# Patient Record
Sex: Male | Born: 1965 | Race: Black or African American | Marital: Married | Smoking: Never smoker
Health system: Southern US, Community
[De-identification: ages and names within clinical notes are randomized; demographics above are authoritative.]

## PROBLEM LIST (undated history)

## (undated) DIAGNOSIS — K219 Gastro-esophageal reflux disease without esophagitis: Secondary | ICD-10-CM

## (undated) DIAGNOSIS — A498 Other bacterial infections of unspecified site: Secondary | ICD-10-CM

## (undated) DIAGNOSIS — C20 Malignant neoplasm of rectum: Secondary | ICD-10-CM

## (undated) DIAGNOSIS — I10 Essential (primary) hypertension: Secondary | ICD-10-CM

## (undated) DIAGNOSIS — T7840XA Allergy, unspecified, initial encounter: Secondary | ICD-10-CM

## (undated) DIAGNOSIS — Z8042 Family history of malignant neoplasm of prostate: Secondary | ICD-10-CM

## (undated) DIAGNOSIS — Z8661 Personal history of infections of the central nervous system: Secondary | ICD-10-CM

## (undated) HISTORY — DX: Family history of malignant neoplasm of prostate: Z80.42

## (undated) HISTORY — DX: Allergy, unspecified, initial encounter: T78.40XA

## (undated) HISTORY — DX: Personal history of infections of the central nervous system: Z86.61

## (undated) NOTE — *Deleted (*Deleted)
[facility]   Telephone:(336) [phone] [phone]   Clinic Follow up Note   Patient Care Team: Kristian Covey, MD as PCP - General (Family Medicine) Willis Modena, MD as Consulting Physician (Gastroenterology) Karie Soda, MD as Consulting Physician (General Surgery) Dorothy Puffer, MD as Consulting Physician (Radiation Oncology) Malachy Mood, MD as Consulting Physician (Oncology)  Date of Service:  08/18/2020   CHIEF COMPLAINT: F/u of rectal cancer  SUMMARY OF ONCOLOGIC HISTORY: Oncology History Overview Note  Cancer Staging Rectal adenocarcinoma [facility]) Staging form: Colon and Rectum, AJCC 8th Edition - Clinical stage from 07/06/2017: Stage IIIB (cT3, cN1, cM0) - Signed by Pollyann Samples, NP on 07/10/2017    Rectal adenocarcinoma s/p robotic LAR resection 10/10/2017  07/03/2017 Imaging   CT ABD PELVIS W CONTRAST IMPRESSION: Possible mass in the rectum compatible carcinoma. Endoscopy recommended for further evaluation. Otherwise negative.   07/03/2017 Tumor Marker   CEA 2.7   07/05/2017 Initial Biopsy   Rectum biopsy -invasive adenocarcinoma, poorly differentiated   07/05/2017 Procedure   Colonoscopy Per Dr. Dulce Sellar Findings: The perianal and digital rectal examinations were normal.  Internal hemorrhoids were found during retroflexion. The hemorrhoids were moderate. No additional abnormalities were found on retroflexion.  A fungating, sessile and ulcerated partially obstructing large mass was found in the recto-sigmoid colon.The mass was partially circumferential (involving two-thirds of the lumen circumference). Oozing was present. Area was successfully injected with 2 mL [place] ink for tattooing. This was biopsied with a cold forceps for histology.   07/05/2017 Initial Diagnosis   Rectal adenocarcinoma (HCC)   07/06/2017 Imaging   Staging MRI ABD/PELVIS revealed clinical stage T3N1M0, IIIb    07/18/2017 - 08/24/2017 Radiation Therapy   Radaition  with Dr Mitzi Hansen     Radiation treatment dates:   07/18/2017 - 08/24/2017  Site/dose:   The rectum was treated to 45 Gy in 25 fractions of 1.8 Gy, followed by a 5.4 Gy boost in 3 fractions to yield a total dose of 50.4 Gy.  Narrative: The patient tolerated radiation treatment relatively well.   He had rectal bleeding with clots that was bright red after bowel movements. He reported loose stools that were painful. The skin to the radiation site was irritated, but no skin breakage was noted.    07/18/2017 Imaging   CT CHEST  IMPRESSION: Negative. No evidence of thoracic metastatic disease or other significant abnormality.    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  The Hereditary Gene Panel offered by [facility] includes sequencing and/or deletion duplication testing of the following 47 genes: APC, ATM, AXIN2, BARD1, BMPR1A, BRCA1, BRCA2, BRIP1, CDH1, CDK4, CDKN2A (p14ARF), CDKN2A (p16INK4a), CHEK2, CTNNA1, DICER1, EPCAM (Deletion/duplication testing only), GREM1 (promoter region deletion/duplication testing only), KIT, MEN1, MLH1, MSH2, MSH3, MSH6, MUTYH, NBN, NF1, NHTL1, PALB2, PDGFRA, PMS2, POLD1, POLE, PTEN, RAD50, RAD51C, RAD51D, SDHB, SDHC, SDHD, SMAD4, SMARCA4. STK11, TP53, TSC1, TSC2, and VHL.  The following genes were evaluated for sequence changes only: SDHA and HOXB13 c.251G>A variant only.  The report date is August 27, 2017.    10/10/2017 Surgery   XI ROBOTIC ASSISTED LOWER ANTERIOR RESECTION AND RIGID PROCTOSCOPY bu Dr. Michaell Cowing and Dr. Cliffton Asters  10/10/17    10/10/2017 Pathology Results       Diagnosis 10/10/17  1. Colon, segmental resection for tumor, sigmoid SMALL FOCUS  OF COLONIC GLANDS WITH HIGH GRADE DYSPLASIA POST NEOADJUVANT CHEMORADIATION THERAPY BIOPSY SITE WITH CHRONIC INFLAMMATION  NO RESIDULE INVASIVE CARCINOMA PRESENT TWENTY-TWO BENIGN LYMPH NODES (0/22) 2. Colon, resection margin (donut), final distal BENIGN COLONIC TISSUE   11/19/2017 - 03/04/2018 Adjuvant Chemotherapy   He started adjuvant 2000 mg Xeloda BID 2 weeks on 1 week off on 11/19/17 and completed 5 cycles on 03/04/18   04/08/2018 Imaging   CT AP W Contrast 04/08/18 IMPRESSION: 1. Postoperative and post treatment related changes in the low anatomic pelvis, as above. Today's study will serve as a new baseline for future follow-up examinations. At this time, there is no definitive evidence to strongly suggest local recurrence of disease or definite metastatic disease in the abdomen or pelvis. 2. Mild pancreatic ductal dilatation. This is of uncertain etiology and significance. No obstructing lesion identified in the pancreatic head. Correlation with nonemergent MRI of the abdomen with and without IV gadolinium with MRCP is suggested in the near future to evaluate for the etiology of this dilatation.    10/21/2018 Imaging   CT CAP W Contrast 10/21/18 IMPRESSION: Status post low anterior resection with suspected radiation changes in the presacral space. No evidence of recurrent or metastatic disease.   10/03/2019 Imaging   CT CAP W Contast  IMPRESSION: 1. No findings of recurrent malignancy. 2. Abnormal prominence of the dorsal pancreatic duct in the pancreatic body and head, measuring up to 6 mm in diameter, similar to prior, cause uncertain. 3. Wall thickening in the lower rectum below the level of the anastomotic staple line, probably therapy related. 4. Stable presacral soft tissue density, likely therapy related. 5. Mild degenerative disc disease at L4-5 and L5-S1.          CURRENT THERAPY:  Surveillance  INTERVAL HISTORY: *** Paul [name] is here for a follow up of rectal cancer. He was last seen by me 6 months ago. He presents to the clinic alone.    REVIEW OF SYSTEMS:  ***  Constitutional: Denies fevers, chills or abnormal weight loss Eyes: Denies blurriness of vision Ears, nose, mouth, throat, and face: Denies mucositis or sore throat Respiratory: Denies cough, dyspnea or wheezes Cardiovascular: Denies palpitation, chest discomfort or lower extremity swelling Gastrointestinal:  Denies nausea, heartburn or change in bowel habits Skin: Denies abnormal skin rashes Lymphatics: Denies new lymphadenopathy or easy bruising Neurological:Denies numbness, tingling or new weaknesses Behavioral/Psych: Mood is stable, no new changes  All other systems were reviewed with the patient and are negative.  MEDICAL HISTORY:  Past Medical History:  Diagnosis Date  . Allergy    grass, dander  . Essential hypertension 07/03/2017  . Family history of prostate cancer   . GERD (gastroesophageal reflux disease)   . History of meningitis   . Hypertension   . 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 COLONOSCOPY WITH PROPOFOL Left 07/05/2017   Procedure: COLONOSCOPY WITH PROPOFOL;  Surgeon: Willis Modena, MD;  Location: [facility] ENDOSCOPY;  Service: Endoscopy;  Laterality: Left;  . PROCTOSCOPY N/A 10/10/2017   Procedure: RIGID PROCTOSCOPY;  Surgeon: Karie Soda, MD;  Location: [facility] ORS;  Service: General;  Laterality: N/A;  . XI ROBOTIC ASSISTED LOWER ANTERIOR RESECTION N/A 10/10/2017   Procedure: XI ROBOTIC ASSISTED LOWER ANTERIOR RESECTION;  Surgeon: Karie Soda, MD;  Location: [facility] ORS;  Service: General;  Laterality: N/A;    I have reviewed the  social history and family history with the patient and they are unchanged from previous note.  ALLERGIES:  is allergic to chloroquine.  MEDICATIONS:  Current Outpatient Medications  Medication Sig Dispense Refill  . amLODipine (NORVASC) 5 MG tablet TAKE 1 TABLET DAILY 90  tablet 3  . calcium carbonate (OS-CAL - DOSED IN MG OF ELEMENTAL CALCIUM) 1250 (500 Ca) MG tablet Take 1 tablet by mouth daily as needed (for bones).    . Calcium Citrate-Vitamin D (CALCIUM CITRATE+D3 PETITES PO) Take 1 tablet by mouth daily.    . cetirizine (ZYRTEC) 10 MG tablet Take 10 mg by mouth daily as needed for allergies.    Marland Kitchen GARLIC PO Take 1 capsule by mouth daily.     . Multiple Vitamin (MULTIVITAMIN WITH MINERALS) TABS tablet Take 1 tablet by mouth daily.    . naproxen (NAPROSYN) 500 MG tablet Take 1 tablet (500 mg total) by mouth every 12 (twelve) hours as needed for mild pain or moderate pain. 30 tablet 1  . omeprazole (PRILOSEC) 40 MG capsule Take 40 mg by mouth daily as needed (heart burn).     . sildenafil (VIAGRA) 100 MG tablet Take 1 tablet (100 mg total) by mouth daily as needed for erectile dysfunction. 10 tablet 11   No current facility-administered medications for this visit.    PHYSICAL EXAMINATION: ECOG PERFORMANCE STATUS: {CHL ONC ECOG PS:[id number]}  There were no vitals filed for this visit. There were no vitals filed for this visit. *** GENERAL:alert, no distress and comfortable SKIN: skin color, texture, turgor are normal, no rashes or significant lesions EYES: normal, Conjunctiva are pink and non-injected, sclera clear {OROPHARYNX:no exudate, no erythema and lips, buccal mucosa, and tongue normal}  NECK: supple, thyroid normal size, non-tender, without nodularity LYMPH:  no palpable lymphadenopathy in the cervical, axillary {or inguinal} LUNGS: clear to auscultation and percussion with normal breathing effort HEART: regular rate & rhythm and no murmurs and no lower extremity edema ABDOMEN:abdomen soft, non-tender and normal bowel sounds Musculoskeletal:no cyanosis of digits and no clubbing  NEURO: alert & oriented x 3 with fluent speech, no focal motor/sensory deficits  LABORATORY DATA:  I have reviewed the data as listed CBC Latest Ref Rng & Units  08/18/2020 02/19/2020 10/22/2019  WBC 4.0 - 10.5 K/uL 4.4 2.7(L) 2.7(L)  Hemoglobin 13.0 - 17.0 g/dL 16.1 09.6 04.5  Hematocrit 39 - 52 % 41.0 38.0(L) 42.4  Platelets 150 - 400 K/uL 196 160 180     CMP Latest Ref Rng & Units 08/18/2020 02/19/2020 10/22/2019  Glucose 70 - 99 mg/dL 97 409(W) 119(J)  BUN 6 - 20 mg/dL 9 10 9   Creatinine 0.61 - 1.24 mg/dL 4.78 2.95 6.21  Sodium 135 - 145 mmol/L 141 140 141  Potassium 3.5 - 5.1 mmol/L 4.1 3.7 4.0  Chloride 98 - 111 mmol/L 103 106 104  CO2 22 - 32 mmol/L 30 27 28   Calcium 8.9 - 10.3 mg/dL 9.7 3.0(Q) 9.0  Total Protein 6.5 - 8.1 g/dL 7.3 7.0 7.8  Total Bilirubin 0.3 - 1.2 mg/dL 0.6 0.5 0.5  Alkaline Phos 38 - 126 U/L 76 76 68  AST 15 - 41 U/L 25 24 26   ALT 0 - 44 U/L 26 24 25       RADIOGRAPHIC STUDIES: I have personally reviewed the radiological images as listed and agreed with the findings in the report. No results found.   ASSESSMENT & PLAN:  Alfreddie Consalvo is a 46 y.o. male with    1. Poorly  differentiated invasive rectal adenocarcinoma, cT3N1M0, stage IIIb, ypT0N0 -He was diagnosed in 06/2017. He is s/p concurrent chemoradiation with Xeloda, Surgical resection and 5 cycles of adjuvant Xeloda.  -He has recovered well fromsurgery and chemotreatment  -He completed repeat colonoscopy in 10/11/2018 through [facility]. Will repeat in 3 years. -He is clinically doing moderately well with mild constipation without completely emptying bowel. He will have occasional stool leakage. He had pelvic PT before with some improvement. Physical exam unremarkable. Labs reviewed, CBC and CMP WNL except stable WBC 2.7, ANC 1.5. CEA is still pending. He denies any recent infections.  -He is 2.5 years since his cancer diagnosis and his risk of recurrence is decreasing. Continue 5 year surveillance. Next and last surveillance scan in 6 months.  -F/u in 6 months.  -He has received his first COVID19 vaccine last week.    2. Anemia; iron deficiency   -Anemia and GI bleeding resolved.   3. GeneticsNegative for pathogenetic mutations  4.Constipation/loose stool, gas,sexual dysfunction,secondary to his surgeryand radiation. -His Bm lately are more constipated and does not feel his bowel are completely emptied. He is still able to have BM daily with no pain or bleeding. He only has mild leakage.  -He has tried Miralax up to 3 times a day and Metamucil without much help. I recommend Seneka-S or liquid magnesium citrate if he cannot have BM for a few days. I encouraged he eat enough fiber and drink enough water.   5. HTN  -he is currently taking 5 mg amlodipinedaily. -He does not have BP cuff at home and goes to [facility] to check it.  -His BP at 154/91 today (02/19/20). He notes he has not taken his medication yet. I suggest he f/u with PCP to better control HTN.   PLAN: -F/u in 6 months with lab and CT CAP a few days before    No problem-specific Assessment & Plan notes found for this encounter.   No orders of the defined types were placed in this encounter.  All questions were answered. The patient knows to call the clinic with any problems, questions or concerns. No barriers to learning was detected. The total time spent in the appointment was {CHL ONC TIME VISIT - [id number]}.     Delphina Cahill 08/18/2020   Rogelia Rohrer, am acting as scribe for Malachy Mood, MD.   {Add scribe attestation statement}

---

## 2004-04-05 ENCOUNTER — Ambulatory Visit (HOSPITAL_COMMUNITY): Admission: RE | Admit: 2004-04-05 | Discharge: 2004-04-05 | Payer: Self-pay | Admitting: Gastroenterology

## 2005-07-31 ENCOUNTER — Ambulatory Visit: Payer: Self-pay | Admitting: Gastroenterology

## 2005-08-16 ENCOUNTER — Ambulatory Visit: Payer: Self-pay | Admitting: Gastroenterology

## 2006-01-13 ENCOUNTER — Encounter: Payer: Self-pay | Admitting: Emergency Medicine

## 2006-01-14 ENCOUNTER — Inpatient Hospital Stay (HOSPITAL_COMMUNITY): Admission: EM | Admit: 2006-01-14 | Discharge: 2006-01-21 | Payer: Self-pay | Admitting: Emergency Medicine

## 2006-02-12 ENCOUNTER — Encounter (INDEPENDENT_AMBULATORY_CARE_PROVIDER_SITE_OTHER): Payer: Self-pay | Admitting: *Deleted

## 2006-02-12 ENCOUNTER — Ambulatory Visit (HOSPITAL_BASED_OUTPATIENT_CLINIC_OR_DEPARTMENT_OTHER): Admission: RE | Admit: 2006-02-12 | Discharge: 2006-02-12 | Payer: Self-pay | Admitting: Urology

## 2007-07-30 ENCOUNTER — Emergency Department (HOSPITAL_COMMUNITY): Admission: EM | Admit: 2007-07-30 | Discharge: 2007-07-30 | Payer: Self-pay | Admitting: Emergency Medicine

## 2008-02-15 ENCOUNTER — Ambulatory Visit: Payer: Self-pay | Admitting: Internal Medicine

## 2008-02-15 ENCOUNTER — Inpatient Hospital Stay (HOSPITAL_COMMUNITY): Admission: EM | Admit: 2008-02-15 | Discharge: 2008-02-15 | Payer: Self-pay | Admitting: Emergency Medicine

## 2009-11-10 ENCOUNTER — Encounter: Admission: RE | Admit: 2009-11-10 | Discharge: 2009-11-10 | Payer: Self-pay | Admitting: Emergency Medicine

## 2011-02-23 ENCOUNTER — Other Ambulatory Visit: Payer: Self-pay | Admitting: Family Medicine

## 2011-02-23 DIAGNOSIS — M549 Dorsalgia, unspecified: Secondary | ICD-10-CM

## 2011-02-24 ENCOUNTER — Ambulatory Visit
Admission: RE | Admit: 2011-02-24 | Discharge: 2011-02-24 | Disposition: A | Discharge status: Home / Self Care | Payer: 59 | Source: Ambulatory Visit | Attending: Family Medicine | Admitting: Family Medicine

## 2011-02-24 DIAGNOSIS — M549 Dorsalgia, unspecified: Secondary | ICD-10-CM

## 2011-03-07 NOTE — H&P (Signed)
Paul [name], [name]:  [id number]   MEDICAL RECORD NO.:  [id number]          PATIENT TYPE:  EMS   LOCATION:  [facility]                         FACILITY:  [facility]   PHYSICIAN:  Gardiner Barefoot, MD    DATE OF BIRTH:  18-Dec-1965   DATE OF ADMISSION:  02/15/2008  DATE OF DISCHARGE:                              HISTORY & PHYSICAL   PRIMARY CARE PHYSICIAN:  [name]. [name], M.D.   CHIEF COMPLAINT:  Left shoulder pain.   HISTORY OF PRESENT ILLNESS:  This is a 45 year old male, previously  healthy, presented here with left shoulder pain for about 1 day that  radiates to his chest.  The patient describes difficulty lifting his  arm, painful with movement, and pressure in his left-sided chest.  The  patient reports that he has never had this before.  No associated  nausea, vomiting, or diaphoresis.  The patient is a nonsmoker and has a  family history of early cardiac disease.  The patient does not know of  any inciting event.   PAST MEDICAL HISTORY:  None.   MEDICATIONS:  None.   ALLERGIES:  No known drug allergies.   SOCIAL HISTORY:  The patient works in second shift.  He denies alcohol,  tobacco, or drugs.   FAMILY HISTORY:  No history of early cardiac death or early cardiac MI.   REVIEW OF SYSTEMS:  Review of systems is negative except as per the  history of present illness.   PHYSICAL EXAMINATION:  VITALS:  Temperature is 99, pulse of 95,  respirations 22, blood pressure is 150/100, O2 sat is 98%.  GENERAL:  The patient is awake, alert, in no acute distress.  CARDIOVASCULAR:  Regular rate and rhythm with murmurs, rubs, or gallops.  Mild tenderness to palpation over the chest and left shoulder.  LUNGS:  Clear to auscultation bilaterally.  ABDOMEN:  Soft, nontender, and nondistended with positive bowel sounds.  No hepatosplenomegaly.  EXTREMITIES:  No clubbing, cyanosis, or edema.   LABORATORY DATA:  BMP notable for a glucose of 115, CBC within normal  limits, CK-MB is 1.3, and troponin I is less than 0.05.  Chest x-ray  normal.  EKG with nonspecific ST and T-wave changes.   ASSESSMENT AND PLAN:  Shoulder pain.  This may be related to atypical  presentation of myocardial infarction.  We will admit the patient for  rule out myocardial infarction.  Although the patient has difficulty  moving his left arm,  he is able to move it, it is the limitation of the movement is his pain,  therefore, would not be consistent with a stroke.  The patient is  otherwise without complaint.  We will start the patient on aspirin and  use morphine and sublingual nitro p.r.n. and consider a stress test.      Gardiner Barefoot, MD  Electronically Signed     RWC/MEDQ  D:  02/15/2008  T:  02/15/2008  Job:  [id number]

## 2011-03-10 NOTE — Discharge Summary (Signed)
Paul [name]             ACCOUNT NO.:  [id number]   MEDICAL RECORD NO.:  [id number]          PATIENT TYPE:  INP   LOCATION:  5507                         FACILITY:  [facility]   PHYSICIAN:  Sherin Quarry, MD      DATE OF BIRTH:  09-16-1966   DATE OF ADMISSION:  01/14/2006  DATE OF DISCHARGE:  01/21/2006                                 DISCHARGE SUMMARY   Paul [name] is a 45 year old gentleman who presented on March 25 with a  10-day history of generalized myalgias and a seven-day history of dull,  persistent occipital headache associated with apparent conjunctivitis.  On  Friday prior to admission he had seen an ophthalmologist and had been  prescribed some prednisolone eye drops.  Ultimately he presented to [facility] on March 25 for evaluation.  On arrival his temperature was  99.2, blood pressure 137/81, pulse was 80, respirations 20.  His white count  was noted to be 10,300.  A CT scan of the brain was done, and this study was  negative.  Of note was that the patient had had a previous CT scan of the  brain the preceding day, which was also negative.  A chest x-ray was  obtained, which showed no acute cardiopulmonary process.  Because of the  patient's of persistent headache, he underwent a spinal fluid examination.  This showed 154 white cells, of which 87% were lymphocytes.  Protein was 38,  which is normal.  Glucose was 77.  Gram stain was negative.  Additional  relevant studies obtained included a negative [facility] spotted fever  serology, negative Lyme disease serology, a herpes simplex virus PCR done on  spinal fluid, which was negative.  Spinal fluid was incubated and the  cultures were no growth. Similarly, blood cultures were no growth.   On admission, the patient was placed on IV of normal saline at 100 mL/hr.  He was initially given Dilaudid p.r.n. for pain,but intermittent boluses of  pain medication really did not seem to relieve his  discomfort very well, and  therefore he was placed on a morphine PCA pump, which seemed to work better.  He was administered Phenergan p.r.n. for nausea and Protonix 40 mg IV daily  empirically.  At the time of admission he was placed on Rocephin 2 g IV  every 12 hours with the desire to empirically cover potential bacterial  meningitis pending the results of cultures.  When cultures were reported  back as being negative, Rocephin was discontinued.  This was done on March  28.  Because of pain medication, the patient experienced constipation and  was given lactulose and Dulcolax p.r.n. for constipation.  His hospital  course was one of very gradual improvement in the headache.  Initially the  patient used a very large amount of morphine via the PCA pump to control the  headache.  The amount of morphine utilized gradually decreased, however  hour.  By April 1, the patient indicated that headache was substantially  improved.  He felt that he could get along as an outpatient.  On the day of  his discharge, a CBC was obtained, which showed a white count 6700,  hemoglobin 13.1.  A CMET was notable only for SGOT of 44 and SGPT of 75.  During the course his hospitalization the patient initially had a very low-  grade temperature in the range of 99.4 to 99.8, but he was afebrile for the  last 48 hours.  On April 1, the patient was discharged.   DISCHARGE DIAGNOSES:  1.  Aseptic meningitis probably secondary to viral illness.  2.  Conjunctivitis, probably also secondary to viral illness.   DISCHARGE MEDICATIONS:  1.  Protonix 40 mg p.o. daily.  2.  TobraDex eye drops two drops to affected eye t.i.d. x7 days.  3.  Tylox one every six hours p.r.n. for pain.   The patient was instructed to call Dr. Recardo Evangelist factors office on Monday to  make a return appointment.   Condition at time of discharge was good.           ______________________________  Sherin Quarry, MD     SY/MEDQ  D:   01/21/2006  T:  01/22/2006  Job:  [id number]   cc:   [name]. [name], M.D.  Fax: [phone]

## 2011-03-10 NOTE — Op Note (Signed)
NAMESCOT, SHIRAISHI             ACCOUNT NO.:  [id number]   MEDICAL RECORD NO.:  [id number]          PATIENT TYPE:  AMB   LOCATION:  [facility]                         [facility]ACILITY:  [facility]   PHYSICIAN:  Maretta Bees. Vonita Moss, M.D.DATE OF BIRTH:  09-17-1966   DATE OF PROCEDURE:  02/12/2006  DATE OF DISCHARGE:                                 OPERATIVE REPORT   PREOPERATIVE DIAGNOSIS:  Rule out interstitial cystitis.   POSTOP DIAGNOSIS:  Rule out interstitial cystitis.   PROCEDURE:  Cystoscopy, hydraulic overdistention of bladder and cold cup  bladder biopsy.   SURGEON:  Dr. Larey Dresser   ANESTHESIA:  General.   INDICATIONS:  This 45 year old black male has a long history of frequency,  nocturia and uncomfortable feeling when his bladder was full.  He had pelvic  pain and symptom score of 12 which is suspicious for IC and the symptom  score of 20 could reflect obstruction.  He is brought to the OR today for  further evaluation.   PROCEDURE:  The patient brought to the operating room, placed in lithotomy  position. External genitalia were prepped and draped usual fashion.  He was  cystoscoped.  The anterior urethra was normal. The prostate was short and  essentially nonobstructing.  The bladder mucosa was perfectly normal. He was  then filled to 850-900 mL and the initial appearance looked like he had  interstitial cystitis and indeed when I emptied the bladder he had  widespread submucosal petechiae and hemorrhage.  Cold cup bladder biopsies  were taken from hemorrhagic areas across the bladder base and these biopsy  sites fulgurated.  The bladder was emptied, cystoscope removed. The patient  sent to recovery room in good condition. He was given a B&O suppository and  will be given 30 milligrams Toradol IV in the recovery room.      Maretta Bees. Vonita Moss, M.D.  Electronically Signed     LJP/MEDQ  D:  02/12/2006  T:  02/13/2006  Job:  [id number]

## 2011-03-10 NOTE — H&P (Signed)
Paul [name]             ACCOUNT NO.:  [id number]   MEDICAL RECORD NO.:  [id number]          PATIENT TYPE:  INP   LOCATION:  5507                         FACILITY:  [facility]   PHYSICIAN:  Sherin Quarry, MD      DATE OF BIRTH:  03/06/1966   DATE OF ADMISSION:  01/14/2006  DATE OF DISCHARGE:                                HISTORY & PHYSICAL   Paul [name] is a pleasant 45 year old man who works as an Print production planner.  He lives at home with his wife and child who is 9 year old.  Neither of his family members have been sick.  He says about 10 days ago he  began to experience generalized aching, particularly in the ribs.  About a  week ago he began experiencing a persistent dull occipital headache which  seems to have steadily worsened.  As the week went on his eyes became  reddened and on Friday he saw an ophthalmologist who prescribed prednisolone  eye drops for him.  He was also given Darvocet take for pain at an urgent  care but this only caused him to have nausea, really did not relief his  discomfort.  Because of persistent severe headache he presented to [facility] today for evaluation.  On arrival his temperature was 99.2,  blood pressure 137/81, pulse was 80, respirations 20.  His white count was  10,300; hemoglobin 13.5. Sodium 136, potassium 3.7, creatinine was 0.9, BUN  was 9.  A CT scan of the brain was done and the verbal report is that this  was normal.  After this was performed the patient underwent a spinal fluid  examination.  This showed a glucose of 77, a total protein of 38.  Gram  stain showed no organism seen.  There was 1 red cell, 131 white cells of  which 73% were lymphocytes.  The patient was given Dilaudid for pain.  This  relieved his pain temporarily but pain returned and he required a second  dose of Dilaudid.  It was therefore felt prudent to admit him to the  hospital for further evaluation and treatment.   PAST MEDICAL HISTORY:  Is  quite unremarkable.  The patient's only  medications at present are the Darvocet that he was given for pain and the  eyedrops.  He has no known drug allergies.  He states that he has had no  operations or hospitalizations.  He states he has never been diagnosed with  any significant illnesses.   FAMILY HISTORY:  He says his father died of natural causes but he does not  seem to know much of the details.  His mother has chronic back problems but  is otherwise in good health.   SOCIAL HISTORY:  He does not drink or smoke.  He does not use drugs.   REVIEW OF SYSTEMS:  HEAD:  See above.  EYES:  He has had some photophobia,  although it is not severe.  EAR, NOSE AND THROAT:  Denies earache, sinus  pain, or sore throat.  CHEST:  Denies coughing, wheezing or chest  congestion.  CARDIOVASCULAR:  Denies orthopnea, PND or leg edema.  GI:  He  had an episode of nausea after getting Darvocet but otherwise has had no  nausea, vomiting or abdominal pain.  GU:  Denies dysuria or urinary  frequency.  NEURO:  There is no history of seizure or stroke:  ENDO:  Denies  excessive thirst, urinary frequency, or nocturia.   PHYSICAL EXAMINATION:  ENT:  Exam is remarkable for mild meningismus.  The  sclerae are somewhat reddened.  Pupils are equal and reactive. Tympanic  membranes are clear. Nares are patent.  Pharynx is without erythema or  exudates.  CHEST:  Clear.  BACK:  Reveals no CVA or point tenderness.  CARDIOVASCULAR:  Shows normal S1 and S2 without rubs, murmurs or gallops.  ABDOMEN:  Benign.  There are normal bowel sounds.  No masses or tenderness,  no guarding or rebound.  NEUROLOGIC TESTING:  Cranial nerves, motor, sensory, and cerebellar testing  is normal.  Station and gait was not tested.  The patient is currently  recumbent because of recent spinal fluid exam.  EXTREMITIES:  Reveals no evidence of cyanosis or edema.   IMPRESSION:  1.  Probable aseptic meningitis.  2.  Probable viral  conjunctivitis.   PLAN:  The patient will be admitted for intravenous fluids and pain  medications.  I am going to empirically give him IV Rocephin although I  think it is extremely unlikely this is bacterial meningitis.  A PCR for  herpes simplex will be done on his spinal fluid and serologic testing for  [facility] spotted fever has also been requested, although again, this  seems unlikely.  The patient will be closely monitored and his comfort will  be carefully observed.           ______________________________  Sherin Quarry, MD     SY/MEDQ  D:  01/14/2006  T:  01/16/2006  Job:  [id number]

## 2011-07-18 LAB — POCT CARDIAC MARKERS
CKMB, poc: 1.3
Troponin i, poc: <0.05

## 2011-07-18 LAB — DIFFERENTIAL
Basophils Relative: 1
Lymphocytes Relative: 21
Lymphs Abs: 1.5
Monocytes Relative: 6
Neutro Abs: 5.1
Neutrophils Relative %: 69

## 2011-07-18 LAB — CBC
Hemoglobin: 14.6
MCHC: 35.3
RBC: 4.69
WBC: 7.3

## 2011-07-18 LAB — POCT I-STAT, CHEM 8
BUN: 15
Chloride: 101
Glucose, Bld: 115 — ABNORMAL HIGH
HCT: 44
Potassium: 3.9

## 2011-07-18 LAB — COMPREHENSIVE METABOLIC PANEL
Albumin: 4.2
BUN: 14
CO2: 30
Chloride: 103
Creatinine, Ser: 1.03
GFR calc non Af Amer: >60
Total Bilirubin: 0.4

## 2011-07-18 LAB — CK TOTAL AND CKMB (NOT AT ARMC)
Relative Index: 1
Total CK: 322 — ABNORMAL HIGH

## 2011-07-18 LAB — CARDIAC PANEL(CRET KIN+CKTOT+MB+TROPI)
Total CK: 251 — ABNORMAL HIGH
Troponin I: 0.01

## 2011-11-09 ENCOUNTER — Encounter: Payer: Self-pay | Admitting: Family Medicine

## 2011-11-09 ENCOUNTER — Ambulatory Visit (INDEPENDENT_AMBULATORY_CARE_PROVIDER_SITE_OTHER): Payer: 59 | Admitting: Family Medicine

## 2011-11-09 DIAGNOSIS — Z8661 Personal history of infections of the central nervous system: Secondary | ICD-10-CM

## 2011-11-09 DIAGNOSIS — D179 Benign lipomatous neoplasm, unspecified: Secondary | ICD-10-CM

## 2011-11-09 NOTE — Patient Instructions (Signed)
Lipoma A lipoma is a noncancerous (benign) tumor composed of fat cells. They are usually found under the skin (subcutaneous). A lipoma may occur in any tissue of the body that contains fat. Common areas for lipomas to appear include the back, shoulders, buttocks, and thighs. Lipomas are a very common soft tissue growth. They are soft and grow slowly. Most problems caused by a lipoma depend on where it is growing. DIAGNOSIS  A lipoma can be diagnosed with a physical exam. These tumors rarely become cancerous, but radiographic studies can help determine this for certain. Studies used may include:  Computerized X-ray scans (CT or CAT scan).   Computerized magnetic scans (MRI).  TREATMENT  Small lipomas that are not causing problems may be watched. If a lipoma continues to enlarge or causes problems, removal is often the best treatment. Lipomas can also be removed to improve appearance. Surgery is done to remove the fatty cells and the surrounding capsule. Most often, this is done with medicine that numbs the area (local anesthetic). The removed tissue is examined under a microscope to make sure it is not cancerous. Keep all follow-up appointments with your caregiver. SEEK MEDICAL CARE IF:   The lipoma becomes larger or hard.   The lipoma becomes painful, red, or increasingly swollen. These could be signs of infection or a more serious condition.  Document Released: 09/29/2002 Document Revised: 06/21/2011 Document Reviewed: 03/11/2010 [facility] Patient Information 2012 [place], [place].  Consider complete physical at some point later this year.

## 2011-11-09 NOTE — Progress Notes (Signed)
  Subjective:    Patient ID: Paul [name], male    DOB: 01-Sep-1966, 46 y.o.   MRN: [id number]  HPI  Patient seen to establish care. He is from [place] but has lived in [place] since 1995. Past medical history reviewed. No chronic medical problems. Takes no medications. No prior surgeries. He has seasonal allergies and takes over-the-counter antihistamine. Previous allergic reaction to chloroquine for malaria prevention.  History of reported meningitis back in 2006.  He is married and has 3 children. No history of smoking. No alcohol use.  Acute problem of one-year history of slowly enlarging lesion left forehead. Nonpainful. No history of injury. No skin changes.   Review of Systems  Constitutional: Negative for appetite change and unexpected weight change.  HENT: Positive for congestion.   Eyes: Negative for visual disturbance.  Respiratory: Negative for cough and shortness of breath.   Cardiovascular: Negative for chest pain.  Gastrointestinal: Negative for abdominal pain.  Skin: Negative for color change.  Neurological: Negative for dizziness and headaches.  Hematological: Negative for adenopathy. Does not bruise/bleed easily.       Objective:   Physical Exam  Constitutional: He appears well-developed and well-nourished. No distress.  HENT:       Patient has fatty consistency lesion left forehead.  This is approximately one and 1/2 cm diameter. No overlying erythema or warmth. Nontender. Mobile.  Neck: Neck supple. No thyromegaly present.  Cardiovascular: Normal rate and regular rhythm.   Pulmonary/Chest: Effort normal and breath sounds normal. No respiratory distress. He has no wheezes. He has no rales.  Musculoskeletal: He exhibits no edema.  Lymphadenopathy:    He has no cervical adenopathy.  Psychiatric: He has a normal mood and affect.          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  Reassured this is very likely benign. Option of surgical  removal if he so desires. Encouraged to followup if he has any rapid growth, firmness to palpation or any overlying skin changes such as redness or warmth or associated pain.  History of reported meningitis 2006, question viral

## 2012-09-09 ENCOUNTER — Ambulatory Visit (INDEPENDENT_AMBULATORY_CARE_PROVIDER_SITE_OTHER): Payer: 59 | Admitting: Family Medicine

## 2012-09-09 ENCOUNTER — Encounter: Payer: Self-pay | Admitting: Family Medicine

## 2012-09-09 VITALS — BP 120/72 | Temp 97.6°F | Wt 204.0 lb

## 2012-09-09 DIAGNOSIS — D179 Benign lipomatous neoplasm, unspecified: Secondary | ICD-10-CM

## 2012-09-09 DIAGNOSIS — K219 Gastro-esophageal reflux disease without esophagitis: Secondary | ICD-10-CM

## 2012-09-09 MED ORDER — OMEPRAZOLE 40 MG PO CPDR: 40.0000 mg | DELAYED_RELEASE_CAPSULE | Freq: Every day | ORAL | Status: DC

## 2012-09-09 NOTE — Patient Instructions (Addendum)
Gastroesophageal Reflux Disease, Adult  Gastroesophageal reflux disease (GERD) happens when acid from your stomach flows up into the esophagus. When acid comes in contact with the esophagus, the acid causes soreness (inflammation) in the esophagus. Over time, GERD may create small holes (ulcers) in the lining of the esophagus.  CAUSES    Increased body weight. This puts pressure on the stomach, making acid rise from the stomach into the esophagus.   Smoking. This increases acid production in the stomach.   Drinking alcohol. This causes decreased pressure in the lower esophageal sphincter (valve or ring of muscle between the esophagus and stomach), allowing acid from the stomach into the esophagus.   Late evening meals and a full stomach. This increases pressure and acid production in the stomach.   A malformed lower esophageal sphincter.  Sometimes, no cause is found.  SYMPTOMS    Burning pain in the lower part of the mid-chest behind the breastbone and in the mid-stomach area. This may occur twice a week or more often.   Trouble swallowing.   Sore throat.   Dry cough.   Asthma-like symptoms including chest tightness, shortness of breath, or wheezing.  DIAGNOSIS   Your caregiver may be able to diagnose GERD based on your symptoms. In some cases, X-rays and other tests may be done to check for complications or to check the condition of your stomach and esophagus.  TREATMENT   Your caregiver may recommend over-the-counter or prescription medicines to help decrease acid production. Ask your caregiver before starting or adding any new medicines.   HOME CARE INSTRUCTIONS    Change the factors that you can control. Ask your caregiver for guidance concerning weight loss, quitting smoking, and alcohol consumption.   Avoid foods and drinks that make your symptoms worse, such as:   Caffeine or alcoholic drinks.   Chocolate.   Peppermint or mint flavorings.   Garlic and onions.   Spicy foods.   Citrus fruits,  such as oranges, lemons, or limes.   Tomato-based foods such as sauce, chili, salsa, and pizza.   Fried and fatty foods.   Avoid lying down for the 3 hours prior to your bedtime or prior to taking a nap.   Eat small, frequent meals instead of large meals.   Wear loose-fitting clothing. Do not wear anything tight around your waist that causes pressure on your stomach.   Raise the head of your bed 6 to 8 inches with wood blocks to help you sleep. Extra pillows will not help.   Only take over-the-counter or prescription medicines for pain, discomfort, or fever as directed by your caregiver.   Do not take aspirin, ibuprofen, or other nonsteroidal anti-inflammatory drugs (NSAIDs).  SEEK IMMEDIATE MEDICAL CARE IF:    You have pain in your arms, neck, jaw, teeth, or back.   Your pain increases or changes in intensity or duration.   You develop nausea, vomiting, or sweating (diaphoresis).   You develop shortness of breath, or you faint.   Your vomit is green, yellow, black, or looks like coffee grounds or blood.   Your stool is red, bloody, or black.  These symptoms could be signs of other problems, such as heart disease, gastric bleeding, or esophageal bleeding.  MAKE SURE YOU:    Understand these instructions.   Will watch your condition.   Will get help right away if you are not doing well or get worse.  Document Released: 07/19/2005 Document Revised: 01/01/2012 Document Reviewed: 04/28/2011  [employer] Patient   Information 2013 [employer].  Diet for Gastroesophageal Reflux Disease, Adult  Reflux (acid reflux) is when acid from your stomach flows up into the esophagus. When acid comes in contact with the esophagus, the acid causes irritation and soreness (inflammation) in the esophagus. When reflux happens often or so severely that it causes damage to the esophagus, it is called gastroesophageal reflux disease (GERD). Nutrition therapy can help ease the discomfort of GERD.  FOODS OR DRINKS TO AVOID OR  LIMIT   Smoking or chewing tobacco. Nicotine is one of the most potent stimulants to acid production in the gastrointestinal tract.   Caffeinated and decaffeinated coffee and black tea.   Regular or low-calorie carbonated beverages or energy drinks (caffeine-free carbonated beverages are allowed).    Strong spices, such as black pepper, white pepper, red pepper, cayenne, curry powder, and chili powder.   Peppermint or spearmint.   Chocolate.   High-fat foods, including meats and fried foods. Extra added fats including oils, butter, salad dressings, and nuts. Limit these to less than 8 tsp per day.   Fruits and vegetables if they are not tolerated, such as citrus fruits or tomatoes.   Alcohol.   Any food that seems to aggravate your condition.  If you have questions regarding your diet, call your caregiver or a registered dietitian.  OTHER THINGS THAT MAY HELP GERD INCLUDE:    Eating your meals slowly, in a relaxed setting.   Eating 5 to 6 small meals per day instead of 3 large meals.   Eliminating food for a period of time if it causes distress.   Not lying down until 3 hours after eating a meal.   Keeping the head of your bed raised 6 to 9 inches (15 to 23 cm) by using a foam wedge or blocks under the legs of the bed. Lying flat may make symptoms worse.   Being physically active. Weight loss may be helpful in reducing reflux in overweight or obese adults.   Wear loose fitting clothing  EXAMPLE MEAL PLAN  This meal plan is approximately 2,000 calories based on ChooseMyPlate.gov meal planning guidelines.  Breakfast    cup cooked oatmeal.   1 cup strawberries.   1 cup low-fat milk.   1 oz almonds.  Snack   1 cup cucumber slices.   6 oz yogurt (made from low-fat or fat-free milk).  Lunch   2 slice whole-wheat bread.   2 oz sliced turkey.   2 tsp mayonnaise.   1 cup blueberries.   1 cup snap peas.  Snack   6 whole-wheat crackers.   1 oz string cheese.  Dinner    cup brown rice.   1  cup mixed veggies.   1 tsp olive oil.   3 oz grilled fish.  Document Released: 10/09/2005 Document Revised: 01/01/2012 Document Reviewed: 08/25/2011  Exit[employer] Patient Information 2013 [employer].

## 2012-09-09 NOTE — Progress Notes (Signed)
Subjective:     Patient ID: Paul [name], male   DOB: 1966-02-05, 46 y.o.   MRN: [id number]  HPI 46 year old here for evaluation of reflux symptoms.  States that he has been feeling a burning discomfort in epigastric, post-sternal and back of throat x2-3 weeks.  Notes that it is worse lying down, and occasionally wakes him up in the middle of the night, causing him to cough.  Also occasionally makes him nauseated.  Denies vomiting, hematemesis, melena, or grossly bloody stool.  Has tried Alka-Seltzer and Prilosec OTC with variable results during this time.    Has remote history ~8 years ago of endoscopy for similar symptoms, but states that no pathology was identified, and at that time started Nexium for 1 month, which was helpful in relieving symptoms.  Had not recurred until current episode started.    Diet includes rice, red meat, fish, garlic, onions, green tea, chocolate, tomatoes and tomato paste.  Does not smoke or use EtOH.  Review of Systems  Constitutional: Negative for fever and unexpected weight change.  Respiratory: Positive for cough (periodically with nighttime reflux sx.). Negative for chest tightness and shortness of breath.   Cardiovascular: Negative for chest pain.  Gastrointestinal: Positive for nausea and abdominal pain (epigastric). Negative for vomiting and blood in stool.       Objective:   Physical Exam  Constitutional: He appears well-developed and well-nourished. No distress.  HENT:  Head: Normocephalic and atraumatic.       Previous lipoma noted on L side of forehead.  Stable.       Assessment:     46 year old with remote history of GERD here for evaluation of reflux symptoms.    Plan:     1. GERD: appears to be exacerbated.  Implement lifestyle modifications including raising head of bed 6-8 inches, limiting caffeine, chocolate, mint, and tomatoes.  Also Rx omeprazole 40mg  daily, and pt can add qHS Zantac or Pepcid for add'l relief.  Currently no red  flags such as dysphagia, hematemesis, or melena.  Follow up if any of these develops, or symptoms do not improve. 2. Lipoma: pt states preference to have surgical evaluation for possible removal.  Referral for general surgeon.  Marthann Schiller, MS3     Agree with assessment and plan as per Marthann Schiller, MS 3 We elected to increase his omeprazole dose as above and lifestyle modification and f/u one month if no improvement. Evelena Peat MD

## 2012-09-10 DIAGNOSIS — K219 Gastro-esophageal reflux disease without esophagitis: Secondary | ICD-10-CM | POA: Insufficient documentation

## 2013-02-28 ENCOUNTER — Ambulatory Visit (INDEPENDENT_AMBULATORY_CARE_PROVIDER_SITE_OTHER): Payer: BC Managed Care – PPO | Admitting: Family Medicine

## 2013-02-28 ENCOUNTER — Encounter: Payer: Self-pay | Admitting: Family Medicine

## 2013-02-28 VITALS — BP 148/98 | HR 72 | Temp 98.9°F | Resp 12 | Ht 71.75 in | Wt 204.0 lb

## 2013-02-28 DIAGNOSIS — Z0000 Encounter for general adult medical examination without abnormal findings: Secondary | ICD-10-CM

## 2013-02-28 LAB — BASIC METABOLIC PANEL
CO2: 27 mEq/L (ref 19–32)
Glucose, Bld: 104 mg/dL — ABNORMAL HIGH (ref 70–99)
Potassium: 4.4 mEq/L (ref 3.5–5.1)
Sodium: 138 mEq/L (ref 135–145)

## 2013-02-28 LAB — CBC WITH DIFFERENTIAL/PLATELET
Eosinophils Relative: 5.8 % — ABNORMAL HIGH (ref 0.0–5.0)
Monocytes Absolute: 0.3 10*3/uL (ref 0.1–1.0)
Monocytes Relative: 8.3 % (ref 3.0–12.0)
Neutrophils Relative %: 46.3 % (ref 43.0–77.0)
Platelets: 180 10*3/uL (ref 150.0–400.0)
WBC: 4.1 10*3/uL — ABNORMAL LOW (ref 4.5–10.5)

## 2013-02-28 LAB — LIPID PANEL
HDL: 62.3 mg/dL (ref >39.00)
Total CHOL/HDL Ratio: 2
VLDL: 19.2 mg/dL (ref 0.0–40.0)

## 2013-02-28 LAB — TSH: TSH: 2.25 u[IU]/mL (ref 0.35–5.50)

## 2013-02-28 LAB — PSA: PSA: 1.05 ng/mL (ref 0.10–4.00)

## 2013-02-28 LAB — HEPATIC FUNCTION PANEL
AST: 32 U/L (ref 0–37)
Total Bilirubin: 1 mg/dL (ref 0.3–1.2)

## 2013-02-28 NOTE — Progress Notes (Signed)
  Subjective:    Patient ID: Paul [name], male    DOB: 04-05-1966, 47 y.o.   MRN: [id number]  HPI Patient here for complete physical He has occasional GERD controlled with omeprazole. Does not take this medication regularly No chronic problems otherwise. No prior surgeries. Patient is from [place]. He is married and has 3 children. Nonsmoker. No regular alcohol use. Past medical history, social history, and family history reviewed and as below  Fairly large lipoma left forehead. Saw Careers adviser. Deemed cosmetic and so he has elected not to have any surgery at this time.  Past Medical History  Diagnosis Date  . Allergy     grass, dander  . History of meningitis    No past surgical history on file.  reports that he has never smoked. He does not have any smokeless tobacco history on file. His alcohol and drug histories are not on file. family history includes Asthma in his father. Allergies  Allergen Reactions  . Chloroquine Itching      Review of Systems  Constitutional: Negative for fever, activity change, appetite change and fatigue.  HENT: Negative for ear pain, congestion and trouble swallowing.   Eyes: Negative for pain and visual disturbance.  Respiratory: Negative for cough, shortness of breath and wheezing.   Cardiovascular: Negative for chest pain and palpitations.  Gastrointestinal: Negative for nausea, vomiting, abdominal pain, diarrhea, constipation, blood in stool, abdominal distention and rectal pain.  Genitourinary: Negative for dysuria, hematuria and testicular pain.  Musculoskeletal: Negative for joint swelling and arthralgias.  Skin: Negative for rash.  Neurological: Negative for dizziness, syncope and headaches.  Hematological: Negative for adenopathy.  Psychiatric/Behavioral: Negative for confusion and dysphoric mood.       Objective:   Physical Exam  Constitutional: He is oriented to person, place, and time. He appears well-developed and well-nourished.  No distress.  HENT:  Head: Normocephalic and atraumatic.  Right Ear: External ear normal.  Left Ear: External ear normal.  Mouth/Throat: Oropharynx is clear and moist.  Soft nontender fatty tumor left forehead  Eyes: Conjunctivae and EOM are normal. Pupils are equal, round, and reactive to light.  Neck: Normal range of motion. Neck supple. No thyromegaly present.  Cardiovascular: Normal rate, regular rhythm and normal heart sounds.   No murmur heard. Pulmonary/Chest: No respiratory distress. He has no wheezes. He has no rales.  Abdominal: Soft. Bowel sounds are normal. He exhibits no distension and no mass. There is no tenderness. There is no rebound and no guarding.  Musculoskeletal: He exhibits no edema.  Lymphadenopathy:    He has no cervical adenopathy.  Neurological: He is alert and oriented to person, place, and time. He displays normal reflexes. No cranial nerve deficit.  Skin: No rash noted.  Psychiatric: He has a normal mood and affect.          Assessment & Plan:  Complete physical. Blood pressure elevated. Lose some weight and start more consistent exercise. Reduce sodium intake. Monitor blood pressure and reassess within 2 months. Initiate medication then if not improved. Screening lab work ordered.

## 2013-02-28 NOTE — Patient Instructions (Signed)

## 2013-03-04 NOTE — Progress Notes (Signed)
Quick Note:  Pt informed ______ 

## 2013-06-02 ENCOUNTER — Ambulatory Visit: Payer: BC Managed Care – PPO | Admitting: Family Medicine

## 2013-06-02 ENCOUNTER — Telehealth: Payer: Self-pay

## 2013-06-02 DIAGNOSIS — Z0289 Encounter for other administrative examinations: Secondary | ICD-10-CM

## 2013-06-02 NOTE — Telephone Encounter (Signed)
Called and left message on pt cell phone that he missed office visit

## 2013-08-14 ENCOUNTER — Encounter: Payer: Self-pay | Admitting: Family Medicine

## 2013-08-14 ENCOUNTER — Ambulatory Visit (INDEPENDENT_AMBULATORY_CARE_PROVIDER_SITE_OTHER): Payer: BC Managed Care – PPO | Admitting: Family Medicine

## 2013-08-14 VITALS — BP 130/72 | HR 76 | Temp 97.9°F | Wt 210.0 lb

## 2013-08-14 DIAGNOSIS — Z23 Encounter for immunization: Secondary | ICD-10-CM

## 2013-08-14 DIAGNOSIS — R030 Elevated blood-pressure reading, without diagnosis of hypertension: Secondary | ICD-10-CM

## 2013-08-14 NOTE — Progress Notes (Signed)
  Subjective:    Patient ID: Paul [name], male    DOB: Aug 14, 1966, 47 y.o.   MRN: [id number]  HPI Patient here for followup regarding elevated blood pressure. He was seen for physical back in May and had blood pressure up around 150/98. He is not exercising consistently and has actually gained a few pounds in weight since then. He does watch his salt intake fairly closely. No headaches. No dizziness. He currently takes omeprazole for GERD but no other medications. Positive family history of hypertension in his mother.  He is describing pruritus involving both legs that occurs only with running. He is not aware of any exercise-induced urticaria. He has not tried any antihistamines pre- running  Past Medical History  Diagnosis Date  . Allergy     grass, dander  . History of meningitis    No past surgical history on file.  reports that he has never smoked. He does not have any smokeless tobacco history on file. His alcohol and drug histories are not on file. family history includes Asthma in his father. Allergies  Allergen Reactions  . Chloroquine Itching      Review of Systems  Constitutional: Negative for appetite change and unexpected weight change.  Respiratory: Negative for shortness of breath.   Cardiovascular: Negative for chest pain.       Objective:   Physical Exam  Constitutional: He appears well-developed and well-nourished. No distress.  Cardiovascular: Normal rate and regular rhythm.  Exam reveals no gallop.   No murmur heard. Pulmonary/Chest: Effort normal and breath sounds normal. No respiratory distress. He has no wheezes. He has no rales.  Musculoskeletal: He exhibits no edema.          Assessment & Plan:  History of elevated blood pressure. Repeat reading today 120/68. We've again stressed importance of regular aerobic exercise and weight control. Regarding his exercise associated pruritus,. We've recommended pre-medicating with Allegra about one to 2  hours prior exercise.

## 2013-08-14 NOTE — Patient Instructions (Signed)
Try Allegra at least 2 hours prior to running to see if this reduces your leg itching.

## 2014-01-15 ENCOUNTER — Ambulatory Visit (INDEPENDENT_AMBULATORY_CARE_PROVIDER_SITE_OTHER): Payer: BC Managed Care – PPO | Admitting: Family Medicine

## 2014-01-15 ENCOUNTER — Encounter: Payer: Self-pay | Admitting: Family Medicine

## 2014-01-15 VITALS — BP 130/90 | HR 96 | Temp 98.4°F | Wt 206.0 lb

## 2014-01-15 DIAGNOSIS — B349 Viral infection, unspecified: Secondary | ICD-10-CM

## 2014-01-15 DIAGNOSIS — B9789 Other viral agents as the cause of diseases classified elsewhere: Secondary | ICD-10-CM

## 2014-01-15 MED ORDER — HYDROCODONE-HOMATROPINE 5-1.5 MG/5ML PO SYRP: 5.0000 mL | ORAL_SOLUTION | Freq: Four times a day (QID) | ORAL | Status: AC | PRN

## 2014-01-15 NOTE — Progress Notes (Signed)
   Subjective:    Patient ID: Paul [name], male    DOB: 06-18-1966, 48 y.o.   MRN: [id number]  Cough Associated symptoms include chills, myalgias and a sore throat. Pertinent negatives include no fever, shortness of breath or wheezing.   Acute visit. Patient developed 3 days ago some rhinorrhea, sore throat, body aches, and mostly nonproductive cough. He has children who have had similar symptoms. He denies any fever. No nausea or vomiting. He took over-the-counter cough medication with minimal relief. Patient is nonsmoker.  Past Medical History  Diagnosis Date  . Allergy     grass, dander  . History of meningitis    No past surgical history on file.  reports that he has never smoked. He does not have any smokeless tobacco history on file. His alcohol and drug histories are not on file. family history includes Asthma in his father. Allergies  Allergen Reactions  . Chloroquine Itching      Review of Systems  Constitutional: Positive for chills. Negative for fever.  HENT: Positive for congestion and sore throat.   Respiratory: Positive for cough. Negative for shortness of breath and wheezing.   Musculoskeletal: Positive for myalgias.       Objective:   Physical Exam  Constitutional: He appears well-developed and well-nourished.  HENT:  Right Ear: External ear normal.  Left Ear: External ear normal.  Mouth/Throat: Oropharynx is clear and moist.  Neck: Neck supple.  Cardiovascular: Normal rate.   Pulmonary/Chest: Effort normal and breath sounds normal. No respiratory distress. He has no wheezes. He has no rales.  Lymphadenopathy:    He has no cervical adenopathy.          Assessment & Plan:  Viral syndrome with cough. Nonfocal exam. Hycodan cough syrup for nighttime use as night for severe cough. Followup as needed

## 2014-01-15 NOTE — Patient Instructions (Signed)
Acute Bronchitis Bronchitis is inflammation of the airways that extend from the windpipe into the lungs (bronchi). The inflammation often causes mucus to develop. This leads to a cough, which is the most common symptom of bronchitis.  In acute bronchitis, the condition usually develops suddenly and goes away over time, usually in a couple weeks. Smoking, allergies, and asthma can make bronchitis worse. Repeated episodes of bronchitis may cause further lung problems.  CAUSES Acute bronchitis is most often caused by the same virus that causes a cold. The virus can spread from person to person (contagious).  SIGNS AND SYMPTOMS   Cough.   Fever.   Coughing up mucus.   Body aches.   Chest congestion.   Chills.   Shortness of breath.   Sore throat.  DIAGNOSIS  Acute bronchitis is usually diagnosed through a physical exam. Tests, such as chest X-rays, are sometimes done to rule out other conditions.  TREATMENT  Acute bronchitis usually goes away in a couple weeks. Often times, no medical treatment is necessary. Medicines are sometimes given for relief of fever or cough. Antibiotics are usually not needed but may be prescribed in certain situations. In some cases, an inhaler may be recommended to help reduce shortness of breath and control the cough. A cool mist vaporizer may also be used to help thin bronchial secretions and make it easier to clear the chest.  HOME CARE INSTRUCTIONS  Get plenty of rest.   Drink enough fluids to keep your urine clear or pale yellow (unless you have a medical condition that requires fluid restriction). Increasing fluids may help thin your secretions and will prevent dehydration.   Only take over-the-counter or prescription medicines as directed by your health care provider.   Avoid smoking and secondhand smoke. Exposure to cigarette smoke or irritating chemicals will make bronchitis worse. If you are a smoker, consider using nicotine gum or skin  patches to help control withdrawal symptoms. Quitting smoking will help your lungs heal faster.   Reduce the chances of another bout of acute bronchitis by washing your hands frequently, avoiding people with cold symptoms, and trying not to touch your hands to your mouth, nose, or eyes.   Follow up with your health care provider as directed.  SEEK MEDICAL CARE IF: Your symptoms do not improve after 1 week of treatment.  SEEK IMMEDIATE MEDICAL CARE IF:  You develop an increased fever or chills.   You have chest pain.   You have severe shortness of breath.  You have bloody sputum.   You develop dehydration.  You develop fainting.  You develop repeated vomiting.  You develop a severe headache. MAKE SURE YOU:   Understand these instructions.  Will watch your condition.  Will get help right away if you are not doing well or get worse. Document Released: 11/16/2004 Document Revised: 06/11/2013 Document Reviewed: 04/01/2013 [employer] Patient Information 2014 [employer].  

## 2014-01-15 NOTE — Progress Notes (Signed)
Pre visit review using our clinic review tool, if applicable. No additional management support is needed unless otherwise documented below in the visit note. 

## 2014-05-18 ENCOUNTER — Encounter: Payer: Self-pay | Admitting: Gastroenterology

## 2014-06-26 ENCOUNTER — Ambulatory Visit (INDEPENDENT_AMBULATORY_CARE_PROVIDER_SITE_OTHER): Payer: BC Managed Care – PPO | Admitting: Family Medicine

## 2014-06-26 ENCOUNTER — Encounter: Payer: Self-pay | Admitting: Family Medicine

## 2014-06-26 VITALS — BP 150/100 | HR 98 | Temp 98.1°F | Ht 71.0 in | Wt 206.0 lb

## 2014-06-26 DIAGNOSIS — I10 Essential (primary) hypertension: Secondary | ICD-10-CM

## 2014-06-26 DIAGNOSIS — Z23 Encounter for immunization: Secondary | ICD-10-CM

## 2014-06-26 DIAGNOSIS — Z0000 Encounter for general adult medical examination without abnormal findings: Secondary | ICD-10-CM

## 2014-06-26 LAB — LIPID PANEL
CHOLESTEROL: 107 mg/dL (ref 0–200)
HDL: 57.2 mg/dL (ref >39.00)
LDL Cholesterol: 38 mg/dL (ref 0–99)
NONHDL: 49.8
Total CHOL/HDL Ratio: 2
Triglycerides: 60 mg/dL (ref 0.0–149.0)
VLDL: 12 mg/dL (ref 0.0–40.0)

## 2014-06-26 LAB — CBC WITH DIFFERENTIAL/PLATELET
BASOS PCT: 0.3 % (ref 0.0–3.0)
Basophils Absolute: 0 10*3/uL (ref 0.0–0.1)
EOS PCT: 5.1 % — AB (ref 0.0–5.0)
Eosinophils Absolute: 0.3 10*3/uL (ref 0.0–0.7)
HEMATOCRIT: 40.8 % (ref 39.0–52.0)
HEMOGLOBIN: 14.1 g/dL (ref 13.0–17.0)
LYMPHS ABS: 0.9 10*3/uL (ref 0.7–4.0)
Lymphocytes Relative: 17.4 % (ref 12.0–46.0)
MCHC: 34.6 g/dL (ref 30.0–36.0)
MCV: 86.8 fl (ref 78.0–100.0)
MONOS PCT: 8.7 % (ref 3.0–12.0)
Monocytes Absolute: 0.4 10*3/uL (ref 0.1–1.0)
NEUTROS ABS: 3.5 10*3/uL (ref 1.4–7.7)
Neutrophils Relative %: 68.5 % (ref 43.0–77.0)
Platelets: 175 10*3/uL (ref 150.0–400.0)
RBC: 4.7 Mil/uL (ref 4.22–5.81)
RDW: 13.5 % (ref 11.5–15.5)
WBC: 5.1 10*3/uL (ref 4.0–10.5)

## 2014-06-26 LAB — PSA: PSA: 1.15 ng/mL (ref 0.10–4.00)

## 2014-06-26 LAB — BASIC METABOLIC PANEL
BUN: 12 mg/dL (ref 6–23)
CALCIUM: 9.3 mg/dL (ref 8.4–10.5)
CO2: 31 mEq/L (ref 19–32)
Chloride: 103 mEq/L (ref 96–112)
Creatinine, Ser: 0.8 mg/dL (ref 0.4–1.5)
GFR: 126.28 mL/min (ref >60.00)
GLUCOSE: 107 mg/dL — AB (ref 70–99)
Potassium: 3.9 mEq/L (ref 3.5–5.1)
SODIUM: 138 meq/L (ref 135–145)

## 2014-06-26 LAB — HEPATIC FUNCTION PANEL
ALT: 34 U/L (ref 0–53)
AST: 29 U/L (ref 0–37)
Albumin: 4.4 g/dL (ref 3.5–5.2)
Alkaline Phosphatase: 58 U/L (ref 39–117)
Bilirubin, Direct: 0.2 mg/dL (ref 0.0–0.3)
Total Bilirubin: 1 mg/dL (ref 0.2–1.2)
Total Protein: 7.1 g/dL (ref 6.0–8.3)

## 2014-06-26 LAB — TSH: TSH: 1.4 u[IU]/mL (ref 0.35–4.50)

## 2014-06-26 MED ORDER — AMLODIPINE BESYLATE 5 MG PO TABS: 5.0000 mg | ORAL_TABLET | Freq: Every day | ORAL | Status: DC

## 2014-06-26 NOTE — Patient Instructions (Signed)

## 2014-06-26 NOTE — Progress Notes (Signed)
Pre visit review using our clinic review tool, if applicable. No additional management support is needed unless otherwise documented below in the visit note. 

## 2014-06-26 NOTE — Progress Notes (Signed)
   Subjective:    Patient ID: Paul [name], male    DOB: 10-29-1965, 48 y.o.   MRN: [id number]  HPI Patient is seen for complete physical.  He has history of seasonal allergies and occasional reflux issues. He's had elevated blood pressure past but never diagnosed with hypertension. Denies any headaches or dizziness. No consistent exercise. No history of smoking. No alcohol use. Tetanus is up to date for one more year.  Family history and social history reviewed and as below  Past Medical History  Diagnosis Date  . Allergy     grass, dander  . History of meningitis    No past surgical history on file.  reports that he has never smoked. He does not have any smokeless tobacco history on file. His alcohol and drug histories are not on file. family history includes Asthma in his father. Allergies  Allergen Reactions  . Chloroquine Itching      Review of Systems  Constitutional: Negative for fever, activity change, appetite change and fatigue.  HENT: Positive for congestion. Negative for ear pain and trouble swallowing.   Eyes: Negative for pain and visual disturbance.  Respiratory: Negative for cough, shortness of breath and wheezing.   Cardiovascular: Negative for chest pain and palpitations.  Gastrointestinal: Negative for nausea, vomiting, abdominal pain, diarrhea, constipation, blood in stool, abdominal distention and rectal pain.  Genitourinary: Negative for dysuria, hematuria and testicular pain.  Musculoskeletal: Negative for arthralgias and joint swelling.  Skin: Negative for rash.  Neurological: Negative for dizziness, syncope and headaches.  Hematological: Negative for adenopathy.  Psychiatric/Behavioral: Negative for confusion and dysphoric mood.       Objective:   Physical Exam  Constitutional: He is oriented to person, place, and time. He appears well-developed and well-nourished. No distress.  HENT:  Head: Normocephalic and atraumatic.  Right Ear: External ear  normal.  Left Ear: External ear normal.  Mouth/Throat: Oropharynx is clear and moist.  Eyes: Conjunctivae and EOM are normal. Pupils are equal, round, and reactive to light.  Neck: Normal range of motion. Neck supple. No thyromegaly present.  Cardiovascular: Normal rate, regular rhythm and normal heart sounds.   No murmur heard. Pulmonary/Chest: No respiratory distress. He has no wheezes. He has no rales.  Abdominal: Soft. Bowel sounds are normal. He exhibits no distension and no mass. There is no tenderness. There is no rebound and no guarding.  Musculoskeletal: He exhibits no edema.  Lymphadenopathy:    He has no cervical adenopathy.  Neurological: He is alert and oriented to person, place, and time. He displays normal reflexes. No cranial nerve deficit.  Skin: No rash noted.  Psychiatric: He has a normal mood and affect.          Assessment & Plan:  Complete physical. Screening labs obtained. Flu vaccine given. He has elevated blood pressure and  this has been consistently up in the past. Start amlodipine 5 mg daily. Reassess in one month

## 2014-06-30 ENCOUNTER — Encounter: Payer: Self-pay | Admitting: Family Medicine

## 2014-07-27 ENCOUNTER — Ambulatory Visit (INDEPENDENT_AMBULATORY_CARE_PROVIDER_SITE_OTHER): Payer: BC Managed Care – PPO | Admitting: Family Medicine

## 2014-07-27 ENCOUNTER — Encounter: Payer: Self-pay | Admitting: Family Medicine

## 2014-07-27 VITALS — BP 124/78 | HR 77 | Wt 207.0 lb

## 2014-07-27 DIAGNOSIS — I10 Essential (primary) hypertension: Secondary | ICD-10-CM

## 2014-07-27 MED ORDER — AMLODIPINE BESYLATE 5 MG PO TABS: 5.0000 mg | ORAL_TABLET | Freq: Every day | ORAL | Status: DC

## 2014-07-27 NOTE — Progress Notes (Signed)
Pre visit review using our clinic review tool, if applicable. No additional management support is needed unless otherwise documented below in the visit note. 

## 2014-07-27 NOTE — Patient Instructions (Signed)

## 2014-07-27 NOTE — Progress Notes (Signed)
   Subjective:    Patient ID: Paul [name], male    DOB: 02-18-1966, 48 y.o.   MRN: [id number]  HPI Patient here for followup hypertension. He was seen for physical recently. He had fairly consistent readings elevated in the past year and we initiated amlodipine 5 mg daily. No edema or headaches. He occasionally misses a dose. He does not feel any different symptomatically. He was not have any headaches or other symptoms at time of diagnosis. He is not monitoring blood pressures. He has not yet started exercise program but plans to start soon.  Past Medical History  Diagnosis Date  . Allergy     grass, dander  . History of meningitis    No past surgical history on file.  reports that he has never smoked. He does not have any smokeless tobacco history on file. His alcohol and drug histories are not on file. family history includes Asthma in his father. Allergies  Allergen Reactions  . Chloroquine Itching     Review of Systems  Constitutional: Negative for fatigue.  Eyes: Negative for visual disturbance.  Respiratory: Negative for cough, chest tightness and shortness of breath.   Cardiovascular: Negative for chest pain, palpitations and leg swelling.  Neurological: Negative for dizziness, syncope, weakness, light-headedness and headaches.       Objective:   Physical Exam  Constitutional: He is oriented to person, place, and time. He appears well-developed and well-nourished.  Eyes: Pupils are equal, round, and reactive to light.  Neck: Neck supple. No thyromegaly present.  Cardiovascular: Normal rate and regular rhythm.   Pulmonary/Chest: Effort normal and breath sounds normal. No respiratory distress. He has no wheezes. He has no rales.  Musculoskeletal: He exhibits no edema.  Neurological: He is alert and oriented to person, place, and time.          Assessment & Plan:  Hypertension. Improved. 124/78 right arm seated after rest. Continue amlodipine 5 mg daily. We  discussed starting and maintaining more consistent aerobic exercise. Routine followup 1 year for physical

## 2014-07-28 ENCOUNTER — Telehealth: Payer: Self-pay | Admitting: Family Medicine

## 2014-07-28 NOTE — Telephone Encounter (Signed)
emmi emailed °

## 2014-10-19 ENCOUNTER — Telehealth: Payer: Self-pay | Admitting: Family Medicine

## 2014-10-19 ENCOUNTER — Other Ambulatory Visit: Payer: Self-pay | Admitting: Family Medicine

## 2014-10-19 DIAGNOSIS — M549 Dorsalgia, unspecified: Secondary | ICD-10-CM

## 2014-10-19 NOTE — Telephone Encounter (Signed)
Would suggest he see Dr Gardenia Phlegm -sports medicine first.

## 2014-10-19 NOTE — Telephone Encounter (Addendum)
Pt has back pain and would like dr burchette to refer him to an orthopedic.

## 2014-10-19 NOTE — Telephone Encounter (Signed)
Referral is ordered

## 2014-11-06 ENCOUNTER — Ambulatory Visit: Payer: BC Managed Care – PPO | Admitting: Family Medicine

## 2015-01-06 ENCOUNTER — Other Ambulatory Visit (HOSPITAL_COMMUNITY): Payer: Self-pay | Admitting: Orthopaedic Surgery

## 2015-01-06 DIAGNOSIS — M545 Low back pain: Secondary | ICD-10-CM

## 2015-01-27 ENCOUNTER — Ambulatory Visit (HOSPITAL_COMMUNITY)
Admission: RE | Admit: 2015-01-27 | Discharge: 2015-01-27 | Disposition: A | Discharge status: Home / Self Care | Payer: BLUE CROSS/BLUE SHIELD | Source: Ambulatory Visit | Attending: Orthopaedic Surgery | Admitting: Orthopaedic Surgery

## 2015-01-27 DIAGNOSIS — G544 Lumbosacral root disorders, not elsewhere classified: Secondary | ICD-10-CM | POA: Diagnosis not present

## 2015-01-27 DIAGNOSIS — M5127 Other intervertebral disc displacement, lumbosacral region: Secondary | ICD-10-CM | POA: Insufficient documentation

## 2015-01-27 DIAGNOSIS — M5440 Lumbago with sciatica, unspecified side: Secondary | ICD-10-CM | POA: Diagnosis present

## 2015-01-27 DIAGNOSIS — M545 Low back pain: Secondary | ICD-10-CM

## 2015-03-26 ENCOUNTER — Emergency Department (HOSPITAL_COMMUNITY)
Admission: EM | Admit: 2015-03-26 | Discharge: 2015-03-26 | Disposition: A | Discharge status: Home / Self Care | Payer: BLUE CROSS/BLUE SHIELD | Attending: Emergency Medicine | Admitting: Emergency Medicine

## 2015-03-26 ENCOUNTER — Encounter (HOSPITAL_COMMUNITY): Payer: Self-pay

## 2015-03-26 DIAGNOSIS — Z7952 Long term (current) use of systemic steroids: Secondary | ICD-10-CM | POA: Insufficient documentation

## 2015-03-26 DIAGNOSIS — M5416 Radiculopathy, lumbar region: Secondary | ICD-10-CM | POA: Diagnosis not present

## 2015-03-26 DIAGNOSIS — Z79899 Other long term (current) drug therapy: Secondary | ICD-10-CM | POA: Diagnosis not present

## 2015-03-26 DIAGNOSIS — R200 Anesthesia of skin: Secondary | ICD-10-CM | POA: Insufficient documentation

## 2015-03-26 DIAGNOSIS — Z8661 Personal history of infections of the central nervous system: Secondary | ICD-10-CM | POA: Insufficient documentation

## 2015-03-26 DIAGNOSIS — M545 Low back pain, unspecified: Secondary | ICD-10-CM

## 2015-03-26 MED ORDER — METHOCARBAMOL 500 MG PO TABS
1000.0000 mg | ORAL_TABLET | Freq: Once | ORAL | Status: AC
Start: 2015-03-26 — End: 2015-03-26
  Administered 2015-03-26: 1000 mg via ORAL
  Filled 2015-03-26: qty 2

## 2015-03-26 MED ORDER — IBUPROFEN 600 MG PO TABS: 600.0000 mg | ORAL_TABLET | Freq: Four times a day (QID) | ORAL | Status: DC | PRN

## 2015-03-26 MED ORDER — KETOROLAC TROMETHAMINE 60 MG/2ML IM SOLN
60.0000 mg | Freq: Once | INTRAMUSCULAR | Status: AC
  Administered 2015-03-26: 60 mg via INTRAMUSCULAR
  Filled 2015-03-26: qty 2

## 2015-03-26 MED ORDER — OXYCODONE-ACETAMINOPHEN 5-325 MG PO TABS: 1.0000 | ORAL_TABLET | ORAL | Status: DC | PRN

## 2015-03-26 MED ORDER — HYDROMORPHONE HCL 1 MG/ML IJ SOLN
2.0000 mg | Freq: Once | INTRAMUSCULAR | Status: AC
  Administered 2015-03-26: 2 mg via INTRAMUSCULAR
  Filled 2015-03-26: qty 2

## 2015-03-26 MED ORDER — OXYCODONE-ACETAMINOPHEN 5-325 MG PO TABS
2.0000 | ORAL_TABLET | Freq: Once | ORAL | Status: AC
  Administered 2015-03-26: 2 via ORAL
  Filled 2015-03-26: qty 2

## 2015-03-26 NOTE — Discharge Instructions (Signed)

## 2015-03-26 NOTE — ED Notes (Signed)
Pt states that he has had back pain for a while, went to orthopedic doctor two days ago and received cortisone shot in back since has had a lot of pain and left foot has been tingling.

## 2015-03-26 NOTE — ED Notes (Signed)
Pt declined a wheelchair for discharge. Wanted to walk out. Pt limping. Tech walking out with him and we placed him in the [facility] steady to assist with getting him to the lobby.

## 2015-03-26 NOTE — ED Provider Notes (Addendum)
CSN: [id number]     Arrival date & time 03/26/15  0337 History   This chart was scribed for Julianne Rice, MD by Evelene Croon, ED Scribe. This patient was seen in room D31C/D31C and the patient's care was started 3:56 AM.   Chief Complaint  Patient presents with  . Back Pain    The history is provided by the patient. No language interpreter was used.     HPI Comments:  Paul [name] is a 49 y.o. male who presents to the Emergency Department complaining of intermittent back pain for years, worse for the last day. Pt saw his orthopedist on 6/1 and received a  cortisone shot which improved his back pain but since receiving the shot he has had numbness to his left foot. Pt states he sneezed ~0300 on 6/2 and his back pain returned and the numbness and pain to his left foot worsened. He denies urinary/bowel incontinence. No focal weakness. He has taken oxycodone with little relief.  The last MRI of his back was on 01/25/2015.  Ortho surgeon: Paul [name]   Past Medical History  Diagnosis Date  . Allergy     grass, dander  . History of meningitis    History reviewed. No pertinent past surgical history. Family History  Problem Relation Age of Onset  . Asthma Father    History  Substance Use Topics  . Smoking status: Never Smoker   . Smokeless tobacco: Not on file  . Alcohol Use: No    Review of Systems  Constitutional: Negative for fever and chills.  Genitourinary: Negative for difficulty urinating.  Musculoskeletal: Positive for myalgias (LLE) and back pain.  Skin: Negative for rash and wound.  Neurological: Positive for numbness. Negative for weakness.  All other systems reviewed and are negative.     Allergies  Chloroquine  Home Medications   Prior to Admission medications   Medication Sig Start Date End Date Taking? Authorizing Provider  amLODipine (NORVASC) 5 MG tablet Take 1 tablet (5 mg total) by mouth daily. 07/27/14  Yes Eulas Post, MD  cetirizine (ZYRTEC) 10 MG  tablet Take 10 mg by mouth daily as needed for allergies.   Yes Historical Provider, MD  Fexofenadine HCl (ALLEGRA PO) Take 1 tablet by mouth daily as needed (allergies).    Yes Historical Provider, MD  fluorometholone (FML) 0.1 % ophthalmic suspension Place 1 drop into both eyes. Per Dr Delman Cheadle. opthalmologist 02/25/13  Yes Historical Provider, MD  methocarbamol (ROBAXIN) 750 MG tablet Take 1-2 tablets by mouth every 6 (six) hours as needed. 03/19/15  Yes Historical Provider, MD  omeprazole (PRILOSEC) 40 MG capsule Take 40 mg by mouth daily as needed (heart burn).  09/09/12  Yes Eulas Post, MD  ibuprofen (ADVIL,MOTRIN) 600 MG tablet Take 1 tablet (600 mg total) by mouth every 6 (six) hours as needed for moderate pain. 03/26/15   Julianne Rice, MD  oxyCODONE-acetaminophen (PERCOCET/ROXICET) 5-325 MG per tablet Take 1-2 tablets by mouth every 4 (four) hours as needed for severe pain. 03/26/15   Julianne Rice, MD   BP 134/88 mmHg  Pulse 68  Temp(Src) 98 F (36.7 C)  Resp 18  Ht 6\' 2"  (1.88 m)  Wt 202 lb (91.627 kg)  BMI 25.92 kg/m2  SpO2 93% Physical Exam  Constitutional: He is oriented to person, place, and time. He appears well-developed and well-nourished. He appears distressed.  HENT:  Head: Normocephalic and atraumatic.  Mouth/Throat: Oropharynx is clear and moist.  Eyes: EOM are normal. Pupils  are equal, round, and reactive to light.  Neck: Normal range of motion. Neck supple.  Cardiovascular: Normal rate and regular rhythm.   Pulmonary/Chest: Effort normal and breath sounds normal. No respiratory distress. He has no wheezes. He has no rales.  Abdominal: Soft. Bowel sounds are normal. He exhibits no distension and no mass. There is no tenderness. There is no rebound and no guarding.  Musculoskeletal: Normal range of motion. He exhibits no edema or tenderness.  Positive straight-leg raise on the left. Distal pulses intact. Tenderness to palpation of the left buttock. No midline  thoracic or lumbar tenderness.  Neurological: He is alert and oriented to person, place, and time.  Subjective decrease in sensation of the left lower extremity compared to the right. 5/5 motor in all extremities.  Skin: Skin is warm and dry. No rash noted. No erythema.  Psychiatric: He has a normal mood and affect. His behavior is normal.  Nursing note and vitals reviewed.   ED Course  Procedures   DIAGNOSTIC STUDIES:  Oxygen Saturation is 100% on RA, normal by my interpretation.    COORDINATION OF CARE:  4:01 AM Will order pain meds.  Discussed treatment plan with pt at bedside and pt agreed to plan.  Labs Review Labs Reviewed - No data to display  Imaging Review No results found.   EKG Interpretation None      MDM   Final diagnoses:  Lumbar radiculopathy    Patient presents with acute exacerbation of his radicular pain. No focal weakness. No urinary incontinence. Afebrile.  Patient's pain is significantly improved. We'll discharge home to follow-up with his orthopedist. He's been given return precautions and is voiced understanding.  Julianne Rice, MD 03/26/15 [phone]  Attempt to discharge the patient states he is unable to walk or sit in a wheelchair. Will get MRI lumbar spine and on-coming emergency physician will follow-up results.  Julianne Rice, MD 03/26/15 [phone]  Patient refusing MRI. Will follow-up with his orthopedist.  Julianne Rice, MD 03/26/15 [phone]

## 2015-10-01 ENCOUNTER — Ambulatory Visit (INDEPENDENT_AMBULATORY_CARE_PROVIDER_SITE_OTHER): Payer: BLUE CROSS/BLUE SHIELD | Admitting: Family Medicine

## 2015-10-01 ENCOUNTER — Encounter: Payer: Self-pay | Admitting: Family Medicine

## 2015-10-01 VITALS — BP 130/90 | HR 76 | Temp 98.0°F | Resp 14 | Ht 74.0 in | Wt 194.1 lb

## 2015-10-01 DIAGNOSIS — Z23 Encounter for immunization: Secondary | ICD-10-CM

## 2015-10-01 DIAGNOSIS — Z0000 Encounter for general adult medical examination without abnormal findings: Secondary | ICD-10-CM

## 2015-10-01 LAB — CBC WITH DIFFERENTIAL/PLATELET
Basophils Absolute: 0 10*3/uL (ref 0.0–0.1)
Basophils Relative: 0.5 % (ref 0.0–3.0)
EOS PCT: 4.4 % (ref 0.0–5.0)
Eosinophils Absolute: 0.2 10*3/uL (ref 0.0–0.7)
HEMATOCRIT: 43.9 % (ref 39.0–52.0)
Hemoglobin: 14.6 g/dL (ref 13.0–17.0)
LYMPHS PCT: 40.9 % (ref 12.0–46.0)
Lymphs Abs: 1.4 10*3/uL (ref 0.7–4.0)
MCHC: 33.2 g/dL (ref 30.0–36.0)
MCV: 88.2 fl (ref 78.0–100.0)
MONOS PCT: 9.8 % (ref 3.0–12.0)
Monocytes Absolute: 0.3 10*3/uL (ref 0.1–1.0)
NEUTROS PCT: 44.4 % (ref 43.0–77.0)
Neutro Abs: 1.6 10*3/uL (ref 1.4–7.7)
Platelets: 174 10*3/uL (ref 150.0–400.0)
RBC: 4.98 Mil/uL (ref 4.22–5.81)
RDW: 13.4 % (ref 11.5–15.5)
WBC: 3.5 10*3/uL — ABNORMAL LOW (ref 4.0–10.5)

## 2015-10-01 LAB — LIPID PANEL
Cholesterol: 111 mg/dL (ref 0–200)
HDL: 65.8 mg/dL (ref >39.00)
LDL Cholesterol: 32 mg/dL (ref 0–99)
NONHDL: 45.34
Total CHOL/HDL Ratio: 2
Triglycerides: 68 mg/dL (ref 0.0–149.0)
VLDL: 13.6 mg/dL (ref 0.0–40.0)

## 2015-10-01 LAB — BASIC METABOLIC PANEL
BUN: 10 mg/dL (ref 6–23)
CO2: 32 mEq/L (ref 19–32)
CREATININE: 0.83 mg/dL (ref 0.40–1.50)
Calcium: 9.5 mg/dL (ref 8.4–10.5)
Chloride: 103 mEq/L (ref 96–112)
GFR: 127.34 mL/min (ref >60.00)
Glucose, Bld: 107 mg/dL — ABNORMAL HIGH (ref 70–99)
Potassium: 4.2 mEq/L (ref 3.5–5.1)
Sodium: 142 mEq/L (ref 135–145)

## 2015-10-01 LAB — HEPATIC FUNCTION PANEL
ALK PHOS: 62 U/L (ref 39–117)
ALT: 30 U/L (ref 0–53)
AST: 27 U/L (ref 0–37)
Albumin: 4.5 g/dL (ref 3.5–5.2)
BILIRUBIN DIRECT: 0.2 mg/dL (ref 0.0–0.3)
TOTAL PROTEIN: 6.9 g/dL (ref 6.0–8.3)
Total Bilirubin: 0.7 mg/dL (ref 0.2–1.2)

## 2015-10-01 LAB — TSH: TSH: 1.36 u[IU]/mL (ref 0.35–4.50)

## 2015-10-01 LAB — PSA: PSA: 0.92 ng/mL (ref 0.10–4.00)

## 2015-10-01 MED ORDER — AMLODIPINE BESYLATE 5 MG PO TABS: 5.0000 mg | ORAL_TABLET | Freq: Every day | ORAL | Status: DC

## 2015-10-01 NOTE — Progress Notes (Signed)
   Subjective:    Patient ID: Paul [name], male    DOB: 11/30/1965, 49 y.o.   MRN: [id number]  HPI Patient seen for complete physical. He has hypertension treated with amlodipine. Sometimes poor compliance with medication. He has lost some weight during the past year due to his efforts. Exercises about 2 days per week. Needs tetanus booster and also flu vaccine. Family history unchanged. He is married with 3 children. All 3 of his children have asthma issues. Patient is nonsmoker.  His wife complains that he snores. He does not have any daytime somnolence and no observed apnea-or other concern for OSA.    Past Medical History  Diagnosis Date  . Allergy     grass, dander  . History of meningitis    No past surgical history on file.  reports that he has never smoked. He does not have any smokeless tobacco history on file. He reports that he does not drink alcohol. His drug history is not on file. family history includes Asthma in his father. Allergies  Allergen Reactions  . Chloroquine Itching      Review of Systems  Constitutional: Negative for fever, activity change, appetite change and fatigue.  HENT: Negative for congestion, ear pain and trouble swallowing.   Eyes: Negative for pain and visual disturbance.  Respiratory: Negative for cough, shortness of breath and wheezing.   Cardiovascular: Negative for chest pain and palpitations.  Gastrointestinal: Negative for nausea, vomiting, abdominal pain, diarrhea, constipation, blood in stool, abdominal distention and rectal pain.  Genitourinary: Negative for dysuria, hematuria and testicular pain.  Musculoskeletal: Negative for joint swelling and arthralgias.  Skin: Negative for rash.  Neurological: Negative for dizziness, syncope and headaches.  Hematological: Negative for adenopathy.  Psychiatric/Behavioral: Negative for confusion and dysphoric mood.       Objective:   Physical Exam  Constitutional: He is oriented to person,  place, and time. He appears well-developed and well-nourished. No distress.  HENT:  Head: Normocephalic and atraumatic.  Right Ear: External ear normal.  Left Ear: External ear normal.  Mouth/Throat: Oropharynx is clear and moist.  Eyes: Conjunctivae and EOM are normal. Pupils are equal, round, and reactive to light.  Neck: Normal range of motion. Neck supple. No thyromegaly present.  Cardiovascular: Normal rate, regular rhythm and normal heart sounds.   No murmur heard. Pulmonary/Chest: No respiratory distress. He has no wheezes. He has no rales.  Abdominal: Soft. Bowel sounds are normal. He exhibits no distension and no mass. There is no tenderness. There is no rebound and no guarding.  Musculoskeletal: He exhibits no edema.  Lymphadenopathy:    He has no cervical adenopathy.  Neurological: He is alert and oriented to person, place, and time. He displays normal reflexes. No cranial nerve deficit.  Skin: No rash noted.  Psychiatric: He has a normal mood and affect.          Assessment & Plan:  Physical exam. Flu vaccine and tetanus given. Schedule screening lab work. Refill amlodipine for one year. Information on DASH diet given.

## 2015-10-01 NOTE — Patient Instructions (Signed)
DASH Eating Plan  DASH stands for "Dietary Approaches to Stop Hypertension." The DASH eating plan is a healthy eating plan that has been shown to reduce high blood pressure (hypertension). Additional health benefits may include reducing the risk of type 2 diabetes mellitus, heart disease, and stroke. The DASH eating plan may also help with weight loss.  WHAT DO I NEED TO KNOW ABOUT THE DASH EATING PLAN?  For the DASH eating plan, you will follow these general guidelines:  · Choose foods with a percent daily value for sodium of less than 5% (as listed on the food label).  · Use salt-free seasonings or herbs instead of table salt or sea salt.  · Check with your health care provider or pharmacist before using salt substitutes.  · Eat lower-sodium products, often labeled as "lower sodium" or "no salt added."  · Eat fresh foods.  · Eat more vegetables, fruits, and low-fat dairy products.  · Choose whole grains. Look for the word "whole" as the first word in the ingredient list.  · Choose fish and skinless chicken or turkey more often than red meat. Limit fish, poultry, and meat to 6 oz (170 g) each day.  · Limit sweets, desserts, sugars, and sugary drinks.  · Choose heart-healthy fats.  · Limit cheese to 1 oz (28 g) per day.  · Eat more home-cooked food and less restaurant, buffet, and fast food.  · Limit fried foods.  · Cook foods using methods other than frying.  · Limit canned vegetables. If you do use them, rinse them well to decrease the sodium.  · When eating at a restaurant, ask that your food be prepared with less salt, or no salt if possible.  WHAT FOODS CAN I EAT?  Seek help from a dietitian for individual calorie needs.  Grains  Whole grain or whole wheat bread. Brown rice. Whole grain or whole wheat pasta. Quinoa, bulgur, and whole grain cereals. Low-sodium cereals. Corn or whole wheat flour tortillas. Whole grain cornbread. Whole grain crackers. Low-sodium crackers.  Vegetables  Fresh or frozen vegetables  (raw, steamed, roasted, or grilled). Low-sodium or reduced-sodium tomato and vegetable juices. Low-sodium or reduced-sodium tomato sauce and paste. Low-sodium or reduced-sodium canned vegetables.   Fruits  All fresh, canned (in natural juice), or frozen fruits.  Meat and Other Protein Products  Ground beef (85% or leaner), grass-fed beef, or beef trimmed of fat. Skinless chicken or turkey. Ground chicken or turkey. Pork trimmed of fat. All fish and seafood. Eggs. Dried beans, peas, or lentils. Unsalted nuts and seeds. Unsalted canned beans.  Dairy  Low-fat dairy products, such as skim or 1% milk, 2% or reduced-fat cheeses, low-fat ricotta or cottage cheese, or plain low-fat yogurt. Low-sodium or reduced-sodium cheeses.  Fats and Oils  Tub margarines without trans fats. Light or reduced-fat mayonnaise and salad dressings (reduced sodium). Avocado. Safflower, olive, or canola oils. Natural peanut or almond butter.  Other  Unsalted popcorn and pretzels.  The items listed above may not be a complete list of recommended foods or beverages. Contact your dietitian for more options.  WHAT FOODS ARE NOT RECOMMENDED?  Grains  White bread. White pasta. White rice. Refined cornbread. Bagels and croissants. Crackers that contain trans fat.  Vegetables  Creamed or fried vegetables. Vegetables in a cheese sauce. Regular canned vegetables. Regular canned tomato sauce and paste. Regular tomato and vegetable juices.  Fruits  Dried fruits. Canned fruit in light or heavy syrup. Fruit juice.  Meat and Other Protein   Products  Fatty cuts of meat. Ribs, chicken wings, bacon, sausage, bologna, salami, chitterlings, fatback, hot dogs, bratwurst, and packaged luncheon meats. Salted nuts and seeds. Canned beans with salt.  Dairy  Whole or 2% milk, cream, half-and-half, and cream cheese. Whole-fat or sweetened yogurt. Full-fat cheeses or blue cheese. Nondairy creamers and whipped toppings. Processed cheese, cheese spreads, or cheese  curds.  Condiments  Onion and garlic salt, seasoned salt, table salt, and sea salt. Canned and packaged gravies. Worcestershire sauce. Tartar sauce. Barbecue sauce. Teriyaki sauce. Soy sauce, including reduced sodium. Steak sauce. Fish sauce. Oyster sauce. Cocktail sauce. Horseradish. Ketchup and mustard. Meat flavorings and tenderizers. Bouillon cubes. Hot sauce. Tabasco sauce. Marinades. Taco seasonings. Relishes.  Fats and Oils  Butter, stick margarine, lard, shortening, ghee, and bacon fat. Coconut, palm kernel, or palm oils. Regular salad dressings.  Other  Pickles and olives. Salted popcorn and pretzels.  The items listed above may not be a complete list of foods and beverages to avoid. Contact your dietitian for more information.  WHERE CAN I FIND MORE INFORMATION?  National Heart, [employer], [employer] Institute: www.nhlbi.nih.gov/health/health-topics/topics/dash/     This information is not intended to replace advice given to you by your health care provider. Make sure you discuss any questions you have with your health care provider.     Document Released: 09/28/2011 Document Revised: 10/30/2014 Document Reviewed: 08/13/2013  Elsevier Interactive Patient Education ©2016 [employer]

## 2015-10-01 NOTE — Progress Notes (Signed)
Pre visit review using our clinic review tool, if applicable. No additional management support is needed unless otherwise documented below in the visit note. 

## 2015-10-03 ENCOUNTER — Encounter: Payer: Self-pay | Admitting: Family Medicine

## 2016-09-29 ENCOUNTER — Ambulatory Visit (INDEPENDENT_AMBULATORY_CARE_PROVIDER_SITE_OTHER): Payer: BLUE CROSS/BLUE SHIELD | Admitting: Family Medicine

## 2016-09-29 ENCOUNTER — Encounter: Payer: Self-pay | Admitting: Family Medicine

## 2016-09-29 VITALS — BP 134/88

## 2016-09-29 DIAGNOSIS — Z23 Encounter for immunization: Secondary | ICD-10-CM

## 2016-09-29 DIAGNOSIS — Z0000 Encounter for general adult medical examination without abnormal findings: Secondary | ICD-10-CM

## 2016-09-29 LAB — BASIC METABOLIC PANEL
BUN: 9 mg/dL (ref 6–23)
CALCIUM: 9.6 mg/dL (ref 8.4–10.5)
CO2: 33 mEq/L — ABNORMAL HIGH (ref 19–32)
Chloride: 102 mEq/L (ref 96–112)
Creatinine, Ser: 0.78 mg/dL (ref 0.40–1.50)
GFR: 136.24 mL/min (ref >60.00)
GLUCOSE: 99 mg/dL (ref 70–99)
Potassium: 4.2 mEq/L (ref 3.5–5.1)
Sodium: 140 mEq/L (ref 135–145)

## 2016-09-29 LAB — CBC WITH DIFFERENTIAL/PLATELET
Basophils Absolute: 0 10*3/uL (ref 0.0–0.1)
Basophils Relative: 0.5 % (ref 0.0–3.0)
EOS PCT: 4.7 % (ref 0.0–5.0)
Eosinophils Absolute: 0.2 10*3/uL (ref 0.0–0.7)
HCT: 44.8 % (ref 39.0–52.0)
Hemoglobin: 15.1 g/dL (ref 13.0–17.0)
Lymphocytes Relative: 35.9 % (ref 12.0–46.0)
Lymphs Abs: 1.5 10*3/uL (ref 0.7–4.0)
MCHC: 33.6 g/dL (ref 30.0–36.0)
MCV: 87.5 fl (ref 78.0–100.0)
MONO ABS: 0.4 10*3/uL (ref 0.1–1.0)
MONOS PCT: 9 % (ref 3.0–12.0)
Neutro Abs: 2 10*3/uL (ref 1.4–7.7)
Neutrophils Relative %: 49.9 % (ref 43.0–77.0)
Platelets: 197 10*3/uL (ref 150.0–400.0)
RBC: 5.13 Mil/uL (ref 4.22–5.81)
RDW: 13.4 % (ref 11.5–15.5)
WBC: 4.1 10*3/uL (ref 4.0–10.5)

## 2016-09-29 LAB — HEPATIC FUNCTION PANEL
ALBUMIN: 4.9 g/dL (ref 3.5–5.2)
ALT: 20 U/L (ref 0–53)
AST: 21 U/L (ref 0–37)
Alkaline Phosphatase: 64 U/L (ref 39–117)
Bilirubin, Direct: 0.2 mg/dL (ref 0.0–0.3)
Total Bilirubin: 0.9 mg/dL (ref 0.2–1.2)
Total Protein: 7.5 g/dL (ref 6.0–8.3)

## 2016-09-29 LAB — LIPID PANEL
Cholesterol: 117 mg/dL (ref 0–200)
HDL: 63.4 mg/dL (ref >39.00)
LDL Cholesterol: 41 mg/dL (ref 0–99)
NonHDL: 53.98
Total CHOL/HDL Ratio: 2
Triglycerides: 66 mg/dL (ref 0.0–149.0)
VLDL: 13.2 mg/dL (ref 0.0–40.0)

## 2016-09-29 LAB — TSH: TSH: 2.19 u[IU]/mL (ref 0.35–4.50)

## 2016-09-29 LAB — PSA: PSA: 1.58 ng/mL (ref 0.10–4.00)

## 2016-09-29 NOTE — Progress Notes (Signed)
Subjective:     Patient ID: Paul [name], male   DOB: 01-23-1966, 50 y.o.   MRN: [id number]  HPI Patient seen for physical exam. He has hypertension treated with amlodipine. No consistent exercise. Does not monitor blood pressure at home. Occasional headaches. He has learned in the past year that his mother was diagnosed with hypertension. His father apparently had prostate cancer. He is married with 3 children. Nonsmoker. No alcohol use. Still needs flu vaccine. Tetanus up-to-date  Past Medical History:  Diagnosis Date  . Allergy    grass, dander  . History of meningitis    History reviewed. No pertinent surgical history.  reports that he has never smoked. He has never used smokeless tobacco. He reports that he does not drink alcohol or use drugs. family history includes Asthma in his father; Cancer in his father; Hypertension in his mother. Allergies  Allergen Reactions  . Chloroquine Itching     Review of Systems  Constitutional: Negative for activity change, appetite change, fatigue and fever.  HENT: Negative for congestion, ear pain and trouble swallowing.   Eyes: Negative for pain and visual disturbance.  Respiratory: Negative for cough, chest tightness, shortness of breath and wheezing.   Cardiovascular: Negative for chest pain, palpitations and leg swelling.  Gastrointestinal: Negative for abdominal distention, abdominal pain, blood in stool, constipation, diarrhea, nausea, rectal pain and vomiting.  Endocrine: Negative for polydipsia and polyuria.  Genitourinary: Negative for dysuria, hematuria and testicular pain.  Musculoskeletal: Negative for arthralgias and joint swelling.  Skin: Negative for rash.  Neurological: Negative for dizziness, syncope, weakness, light-headedness and headaches.  Hematological: Negative for adenopathy.  Psychiatric/Behavioral: Negative for confusion and dysphoric mood.       Objective:   Physical Exam  Constitutional: He is oriented to  person, place, and time. He appears well-developed and well-nourished. No distress.  HENT:  Head: Normocephalic and atraumatic.  Right Ear: External ear normal.  Left Ear: External ear normal.  Mouth/Throat: Oropharynx is clear and moist.  Eyes: Conjunctivae and EOM are normal. Pupils are equal, round, and reactive to light.  Neck: Normal range of motion. Neck supple. No thyromegaly present.  Cardiovascular: Normal rate, regular rhythm and normal heart sounds.   No murmur heard. Pulmonary/Chest: No respiratory distress. He has no wheezes. He has no rales.  Abdominal: Soft. Bowel sounds are normal. He exhibits no distension and no mass. There is no tenderness. There is no rebound and no guarding.  Musculoskeletal: He exhibits no edema.  Lymphadenopathy:    He has no cervical adenopathy.  Neurological: He is alert and oriented to person, place, and time. He displays normal reflexes. No cranial nerve deficit.  Skin: No rash noted.  Psychiatric: He has a normal mood and affect.       Assessment:     Physical exam. Patient has hypertension with marginal control    Plan:     -flu vaccine given -Suggested home glucose monitoring and be in touch if consistently greater than 140/90 -Continue amlodipine 5 mg daily -Obtain screening lab work. Include PSA with positive family history of prostate cancer -Try to establish more consistent aerobic exercise  Eulas Post MD [facility] at [facility]

## 2016-09-29 NOTE — Progress Notes (Signed)
Pre visit review using our clinic review tool, if applicable. No additional management support is needed unless otherwise documented below in the visit note. 

## 2016-09-29 NOTE — Patient Instructions (Signed)
Consider home BP monitor Our goal is less than 140/90.

## 2017-03-10 ENCOUNTER — Other Ambulatory Visit: Payer: Self-pay | Admitting: Family Medicine

## 2017-06-27 ENCOUNTER — Encounter: Payer: Self-pay | Admitting: Family Medicine

## 2017-06-27 ENCOUNTER — Ambulatory Visit (INDEPENDENT_AMBULATORY_CARE_PROVIDER_SITE_OTHER): Payer: BLUE CROSS/BLUE SHIELD | Admitting: Family Medicine

## 2017-06-27 VITALS — BP 120/84 | HR 71 | Temp 98.1°F | Wt 196.1 lb

## 2017-06-27 DIAGNOSIS — Z23 Encounter for immunization: Secondary | ICD-10-CM | POA: Diagnosis not present

## 2017-06-27 DIAGNOSIS — K648 Other hemorrhoids: Secondary | ICD-10-CM | POA: Diagnosis not present

## 2017-06-27 DIAGNOSIS — K921 Melena: Secondary | ICD-10-CM | POA: Diagnosis not present

## 2017-06-27 LAB — CBC WITH DIFFERENTIAL/PLATELET
BASOS ABS: 0 10*3/uL (ref 0.0–0.1)
Basophils Relative: 0.6 % (ref 0.0–3.0)
Eosinophils Absolute: 0.3 10*3/uL (ref 0.0–0.7)
Eosinophils Relative: 6.6 % — ABNORMAL HIGH (ref 0.0–5.0)
HCT: 41 % (ref 39.0–52.0)
Hemoglobin: 13.4 g/dL (ref 13.0–17.0)
Lymphocytes Relative: 28.3 % (ref 12.0–46.0)
Lymphs Abs: 1.2 10*3/uL (ref 0.7–4.0)
MCHC: 32.7 g/dL (ref 30.0–36.0)
MCV: 88.1 fl (ref 78.0–100.0)
MONOS PCT: 9.4 % (ref 3.0–12.0)
Monocytes Absolute: 0.4 10*3/uL (ref 0.1–1.0)
Neutro Abs: 2.3 10*3/uL (ref 1.4–7.7)
Neutrophils Relative %: 55.1 % (ref 43.0–77.0)
PLATELETS: 214 10*3/uL (ref 150.0–400.0)
RBC: 4.65 Mil/uL (ref 4.22–5.81)
RDW: 14.5 % (ref 11.5–15.5)
WBC: 4.1 10*3/uL (ref 4.0–10.5)

## 2017-06-27 NOTE — Progress Notes (Signed)
Subjective:     Patient ID: Paul [name], male   DOB: [birth year], 51 y.o.   MRN: [id number]  HPI Patient seen with some bright red blood per rectum past 4 days or so when wiping. He has not noted any in toilette or on stool. He's not had any recent constipation. Had some mild loose stools for the past 5 days. No fever. Appetite is good and weight is stable. Denies history of hemorrhoids. No perianal or perirectal pain. Denies any family history of GI issues such as colon cancer.  No alleviating or exacerbating factors. No aspirin use.  Past Medical History:  Diagnosis Date  . Allergy    grass, dander  . History of meningitis    No past surgical history on file.  reports that he has never smoked. He has never used smokeless tobacco. He reports that he does not drink alcohol or use drugs. family history includes Asthma in his father; Cancer in his father; Hypertension in his mother. Allergies  Allergen Reactions  . Chloroquine Itching     Review of Systems  Constitutional: Negative for appetite change, fever and unexpected weight change.  Gastrointestinal: Negative for abdominal pain, constipation, nausea and vomiting.  Genitourinary: Negative for dysuria.  Neurological: Negative for dizziness and weakness.       Objective:   Physical Exam  Cardiovascular: Normal rate and regular rhythm.   Pulmonary/Chest: Effort normal and breath sounds normal. No respiratory distress. He has no wheezes. He has no rales.  Genitourinary:  Genitourinary Comments: No external hemorrhoids. No visible anal fissure. Digital exam no mass palpated. Hemoccult positive.  Anoscopy performed. He does have some internal hemorrhoids but cannot see any active bleeding.       Assessment:     Hematochezia with reported bright red blood per rectum. Hemoccult positive. No obvious source of bleeding found on exam today. Does not any red flags such as appetite or weight changes.    Plan:     -Check  CBC -Consider GI referral for further evaluation -Continue high-fiber diet  Eulas Post MD [facility]

## 2017-06-27 NOTE — Patient Instructions (Signed)
I will be setting up GI referral We will call you with CBC results.

## 2017-06-28 ENCOUNTER — Encounter: Payer: Self-pay | Admitting: Family Medicine

## 2017-06-28 ENCOUNTER — Encounter: Payer: Self-pay | Admitting: Gastroenterology

## 2017-07-03 ENCOUNTER — Emergency Department (HOSPITAL_COMMUNITY): Payer: BLUE CROSS/BLUE SHIELD

## 2017-07-03 ENCOUNTER — Inpatient Hospital Stay (HOSPITAL_COMMUNITY)
Admission: EM | Admit: 2017-07-03 | Discharge: 2017-07-06 | DRG: 376 | Disposition: A | Discharge status: Home / Self Care | Payer: BLUE CROSS/BLUE SHIELD | Attending: Internal Medicine | Admitting: Internal Medicine

## 2017-07-03 ENCOUNTER — Encounter (HOSPITAL_COMMUNITY): Payer: Self-pay | Admitting: *Deleted

## 2017-07-03 DIAGNOSIS — Z825 Family history of asthma and other chronic lower respiratory diseases: Secondary | ICD-10-CM

## 2017-07-03 DIAGNOSIS — R197 Diarrhea, unspecified: Secondary | ICD-10-CM | POA: Diagnosis present

## 2017-07-03 DIAGNOSIS — C19 Malignant neoplasm of rectosigmoid junction: Principal | ICD-10-CM | POA: Diagnosis present

## 2017-07-03 DIAGNOSIS — K648 Other hemorrhoids: Secondary | ICD-10-CM | POA: Diagnosis present

## 2017-07-03 DIAGNOSIS — K219 Gastro-esophageal reflux disease without esophagitis: Secondary | ICD-10-CM | POA: Diagnosis not present

## 2017-07-03 DIAGNOSIS — I10 Essential (primary) hypertension: Secondary | ICD-10-CM

## 2017-07-03 DIAGNOSIS — Z8249 Family history of ischemic heart disease and other diseases of the circulatory system: Secondary | ICD-10-CM

## 2017-07-03 DIAGNOSIS — R630 Anorexia: Secondary | ICD-10-CM | POA: Diagnosis present

## 2017-07-03 DIAGNOSIS — A498 Other bacterial infections of unspecified site: Secondary | ICD-10-CM | POA: Diagnosis not present

## 2017-07-03 DIAGNOSIS — E860 Dehydration: Secondary | ICD-10-CM | POA: Diagnosis present

## 2017-07-03 DIAGNOSIS — Z8042 Family history of malignant neoplasm of prostate: Secondary | ICD-10-CM

## 2017-07-03 DIAGNOSIS — B9623 Unspecified Shiga toxin-producing Escherichia coli [E. coli] (STEC) as the cause of diseases classified elsewhere: Secondary | ICD-10-CM | POA: Diagnosis present

## 2017-07-03 DIAGNOSIS — K6389 Other specified diseases of intestine: Secondary | ICD-10-CM | POA: Diagnosis present

## 2017-07-03 DIAGNOSIS — Z79899 Other long term (current) drug therapy: Secondary | ICD-10-CM

## 2017-07-03 DIAGNOSIS — K639 Disease of intestine, unspecified: Secondary | ICD-10-CM | POA: Diagnosis not present

## 2017-07-03 HISTORY — DX: Other bacterial infections of unspecified site: A49.8

## 2017-07-03 HISTORY — DX: Gastro-esophageal reflux disease without esophagitis: K21.9

## 2017-07-03 HISTORY — DX: Essential (primary) hypertension: I10

## 2017-07-03 LAB — GASTROINTESTINAL PANEL BY PCR, STOOL (REPLACES STOOL CULTURE)
ADENOVIRUS F40/41: NOT DETECTED
ASTROVIRUS: NOT DETECTED
CAMPYLOBACTER SPECIES: NOT DETECTED
CYCLOSPORA CAYETANENSIS: NOT DETECTED
Cryptosporidium: NOT DETECTED
E. coli O157: NOT DETECTED
ENTEROAGGREGATIVE E COLI (EAEC): NOT DETECTED
ENTEROTOXIGENIC E COLI (ETEC): NOT DETECTED
Entamoeba histolytica: NOT DETECTED
GIARDIA LAMBLIA: NOT DETECTED
Norovirus GI/GII: NOT DETECTED
PLESIMONAS SHIGELLOIDES: NOT DETECTED
Rotavirus A: NOT DETECTED
Salmonella species: NOT DETECTED
Sapovirus (I, II, IV, and V): NOT DETECTED
Shiga like toxin producing E coli (STEC): DETECTED — AB
Shigella/Enteroinvasive E coli (EIEC): NOT DETECTED
VIBRIO CHOLERAE: NOT DETECTED
VIBRIO SPECIES: NOT DETECTED
Yersinia enterocolitica: NOT DETECTED

## 2017-07-03 LAB — POC OCCULT BLOOD, ED: Fecal Occult Bld: POSITIVE — AB

## 2017-07-03 LAB — CBC
HCT: 40.3 % (ref 39.0–52.0)
Hemoglobin: 13.4 g/dL (ref 13.0–17.0)
MCH: 27.9 pg (ref 26.0–34.0)
MCHC: 33.3 g/dL (ref 30.0–36.0)
MCV: 84 fL (ref 78.0–100.0)
Platelets: 220 10*3/uL (ref 150–400)
RBC: 4.8 MIL/uL (ref 4.22–5.81)
RDW: 13.1 % (ref 11.5–15.5)
WBC: 5.3 10*3/uL (ref 4.0–10.5)

## 2017-07-03 LAB — COMPREHENSIVE METABOLIC PANEL
ALT: 26 U/L (ref 17–63)
AST: 29 U/L (ref 15–41)
Albumin: 3.9 g/dL (ref 3.5–5.0)
Alkaline Phosphatase: 67 U/L (ref 38–126)
Anion gap: 8 (ref 5–15)
BUN: 7 mg/dL (ref 6–20)
CO2: 26 mmol/L (ref 22–32)
Calcium: 9.3 mg/dL (ref 8.9–10.3)
Chloride: 104 mmol/L (ref 101–111)
Creatinine, Ser: 0.9 mg/dL (ref 0.61–1.24)
GFR calc Af Amer: >60 mL/min (ref >60)
GFR calc non Af Amer: >60 mL/min (ref >60)
Glucose, Bld: 127 mg/dL — ABNORMAL HIGH (ref 65–99)
Potassium: 4.3 mmol/L (ref 3.5–5.1)
Sodium: 138 mmol/L (ref 135–145)
Total Bilirubin: 0.5 mg/dL (ref 0.3–1.2)
Total Protein: 6.9 g/dL (ref 6.5–8.1)

## 2017-07-03 LAB — RAPID HIV SCREEN (HIV 1/2 AB+AG)
HIV 1/2 ANTIBODIES: NONREACTIVE
HIV-1 P24 Antigen - HIV24: NONREACTIVE

## 2017-07-03 LAB — ABO/RH: ABO/RH(D): AB POS

## 2017-07-03 LAB — TYPE AND SCREEN
ABO/RH(D): AB POS
Antibody Screen: NEGATIVE

## 2017-07-03 LAB — SEDIMENTATION RATE: Sed Rate: 3 mm/hr (ref 0–16)

## 2017-07-03 MED ORDER — ACETAMINOPHEN 650 MG RE SUPP: 650.0000 mg | Freq: Four times a day (QID) | RECTAL | Status: DC | PRN

## 2017-07-03 MED ORDER — OXYCODONE HCL 5 MG PO TABS
5.0000 mg | ORAL_TABLET | ORAL | Status: DC | PRN
  Filled 2017-07-03: qty 1

## 2017-07-03 MED ORDER — KETOROLAC TROMETHAMINE 30 MG/ML IJ SOLN: 30.0000 mg | Freq: Four times a day (QID) | INTRAMUSCULAR | Status: DC | PRN

## 2017-07-03 MED ORDER — RISAQUAD PO CAPS
2.0000 | ORAL_CAPSULE | Freq: Two times a day (BID) | ORAL | Status: DC
  Administered 2017-07-04 – 2017-07-06 (×5): 2 via ORAL
  Filled 2017-07-03 (×5): qty 2

## 2017-07-03 MED ORDER — AMLODIPINE BESYLATE 5 MG PO TABS
5.0000 mg | ORAL_TABLET | Freq: Every day | ORAL | Status: DC
  Administered 2017-07-04 – 2017-07-06 (×3): 5 mg via ORAL
  Filled 2017-07-03 (×3): qty 1

## 2017-07-03 MED ORDER — PANTOPRAZOLE SODIUM 40 MG PO TBEC: 80.0000 mg | DELAYED_RELEASE_TABLET | Freq: Every day | ORAL | Status: DC

## 2017-07-03 MED ORDER — SODIUM CHLORIDE 0.45 % IV SOLN
INTRAVENOUS | Status: DC
  Administered 2017-07-03 – 2017-07-04 (×2): via INTRAVENOUS

## 2017-07-03 MED ORDER — ONDANSETRON HCL 4 MG/2ML IJ SOLN: 4.0000 mg | Freq: Four times a day (QID) | INTRAMUSCULAR | Status: DC | PRN

## 2017-07-03 MED ORDER — ACETAMINOPHEN 325 MG PO TABS: 650.0000 mg | ORAL_TABLET | Freq: Four times a day (QID) | ORAL | Status: DC | PRN

## 2017-07-03 MED ORDER — ONDANSETRON HCL 4 MG PO TABS: 4.0000 mg | ORAL_TABLET | Freq: Four times a day (QID) | ORAL | Status: DC | PRN

## 2017-07-03 MED ORDER — IOPAMIDOL (ISOVUE-300) INJECTION 61%
INTRAVENOUS | Status: AC
  Administered 2017-07-03: 100 mL
  Filled 2017-07-03: qty 100

## 2017-07-03 NOTE — ED Provider Notes (Signed)
[facility] DEPT Provider Note   CSN: [id number] Arrival date & time: 07/03/17  0906     History   Chief Complaint Chief Complaint  Patient presents with  . GI Bleeding    HPI Paul [name] is a 51 y.o. male.  HPI   51 yo M with PMHx HTN, GERD here with GI bleed. Pt reports that over the last month, he has had persistent watery diarrhea with intermittent cramp like abdominal pain. He has had associated poor appetite and weight loss. Over the past several days, he has begun to have initially blood clot full and now grossly bloody bowel movements. He has had fatigue with this with increasing crampy abdominal pain. No blood thinner use. No syncope. No loss of consciousness. He has never had a colonoscopy or GI work-up.  Past Medical History:  Diagnosis Date  . Allergy    grass, dander  . GERD (gastroesophageal reflux disease)   . History of meningitis   . Hypertension     Patient Active Problem List   Diagnosis Date Noted  . Colonic mass 07/03/2017  . Essential hypertension 07/03/2017  . 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 Essential hypertension, benign 06/26/2014  . Elevated blood pressure 08/14/2013  . GERD (gastroesophageal reflux disease) 09/10/2012    History reviewed. No pertinent surgical history.     Home Medications    Prior to Admission medications   Medication Sig Start Date End Date Taking? Authorizing Provider  amLODipine (NORVASC) 5 MG tablet TAKE 1 TABLET DAILY 03/12/17  Yes Burchette, Alinda Sierras, MD  calcium carbonate (OS-CAL - DOSED IN MG OF ELEMENTAL CALCIUM) 1250 (500 Ca) MG tablet Take 1 tablet by mouth daily as needed (for bones).   Yes [provider]  cetirizine (ZYRTEC) 10 MG tablet Take 10 mg by mouth daily as needed for allergies.   Yes [provider]  Multiple Vitamin (MULTIVITAMIN WITH MINERALS) TABS tablet Take 1 tablet by mouth daily.   Yes [provider]  naproxen sodium (ANAPROX)  220 MG tablet Take 440-660 mg by mouth daily as needed (for headache).   Yes [provider]  Fexofenadine HCl (ALLEGRA PO) Take 1 tablet by mouth daily as needed (allergies).     [provider]  ibuprofen (ADVIL,MOTRIN) 600 MG tablet Take 1 tablet (600 mg total) by mouth every 6 (six) hours as needed for moderate pain. 03/26/15   Julianne Rice, MD  omeprazole (PRILOSEC) 40 MG capsule Take 40 mg by mouth daily as needed (heart burn).  09/09/12   Burchette, Alinda Sierras, MD    Family History Family History  Problem Relation Age of Onset  . Hypertension Mother   . Asthma Father   . Cancer Father        Prostate    Social History Social History  Substance Use Topics  . Smoking status: Never Smoker  . Smokeless tobacco: Never Used  . Alcohol use No     Allergies   Chloroquine   Review of Systems Review of Systems  Constitutional: Positive for fatigue and unexpected weight change. Negative for fever.  Gastrointestinal: Positive for anal bleeding and blood in stool.  Neurological: Positive for weakness.  All other systems reviewed and are negative.    Physical Exam Updated Vital Signs BP 116/73   Pulse 66   Temp 98.5 F (36.9 C) (Oral)   Resp 18   Ht 6\' 1"  (1.854 m)   Wt 88.9 kg (196 lb)   SpO2 98%  BMI 25.86 kg/m   Physical Exam  Constitutional: He is oriented to person, place, and time. He appears well-developed and well-nourished. No distress.  HENT:  Head: Normocephalic and atraumatic.  Eyes: Conjunctivae are normal.  Neck: Neck supple.  Cardiovascular: Normal rate, regular rhythm and normal heart sounds.  Exam reveals no friction rub.   No murmur heard. Pulmonary/Chest: Effort normal and breath sounds normal. No respiratory distress. He has no wheezes. He has no rales.  Abdominal: Soft. Bowel sounds are normal. He exhibits no distension. There is tenderness (mild, generalized). There is no rebound and no guarding.  Genitourinary:    Genitourinary Comments: No active rectal bleeding  Musculoskeletal: He exhibits no edema.  Neurological: He is alert and oriented to person, place, and time. He exhibits normal muscle tone.  Skin: Skin is warm. Capillary refill takes less than 2 seconds.  Psychiatric: He has a normal mood and affect.  Nursing note and vitals reviewed.    ED Treatments / Results  Labs (all labs ordered are listed, but only abnormal results are displayed) Labs Reviewed  GASTROINTESTINAL PANEL BY PCR, STOOL (REPLACES STOOL CULTURE) - Abnormal; Notable for the following:       Result Value   Shiga like toxin producing E coli (STEC) DETECTED (*)    All other components within normal limits  COMPREHENSIVE METABOLIC PANEL - Abnormal; Notable for the following:    Glucose, Bld 127 (*)    All other components within normal limits  POC OCCULT BLOOD, ED - Abnormal; Notable for the following:    Fecal Occult Bld POSITIVE (*)    All other components within normal limits  CBC  RAPID HIV SCREEN (HIV 1/2 AB+AG)  CBC  COMPREHENSIVE METABOLIC PANEL  CEA  SEDIMENTATION RATE  TYPE AND SCREEN  ABO/RH    EKG  EKG Interpretation None       Radiology Ct Abdomen Pelvis W Contrast  Result Date: 07/03/2017 CLINICAL DATA:  Abdominal pain.  Bloody diarrhea. EXAM: CT ABDOMEN AND PELVIS WITH CONTRAST TECHNIQUE: Multidetector CT imaging of the abdomen and pelvis was performed using the standard protocol following bolus administration of intravenous contrast. CONTRAST:  177mL ISOVUE-300 IOPAMIDOL (ISOVUE-300) INJECTION 61% COMPARISON:  None. FINDINGS: Lower chest: Lung bases clear.  Heart size within normal limits Hepatobiliary: Normal liver.  Gallbladder and bile ducts normal Pancreas: Negative Spleen: Negative Adrenals/Urinary Tract: Normal kidneys. No renal mass or obstruction or stone. Normal bladder Stomach/Bowel: Stomach and small bowel normal. Negative for bowel obstruction. Normal appendix. Nondilated colon.  Possible soft tissue mass in the mid rectum which could represent neoplasm. Correlate with endoscopy. Vascular/Lymphatic: Negative Reproductive: Mild prostate enlargement. 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 Endoscopy recommended for further evaluation. Otherwise negative. Electronically Signed   By: Franchot Gallo M.D.   On: 07/03/2017 14:24    Procedures Procedures (including critical care time)  Medications Ordered in ED Medications  amLODipine (NORVASC) tablet 5 mg (not administered)  0.45 % sodium chloride infusion ( Intravenous New Bag/Given 07/03/17 1822)  acetaminophen (TYLENOL) tablet 650 mg (not administered)    Or  acetaminophen (TYLENOL) suppository 650 mg (not administered)  ondansetron (ZOFRAN) tablet 4 mg (not administered)    Or  ondansetron (ZOFRAN) injection 4 mg (not administered)  oxyCODONE (Oxy IR/ROXICODONE) immediate release tablet 5 mg (not administered)  ketorolac (TORADOL) 30 MG/ML injection 30 mg (not administered)  lactobacillus acidophilus (BACID) tablet 2 tablet (not administered)  iopamidol (ISOVUE-300) 61 % injection (100 mLs  Contrast Given 07/03/17 1406)     Initial Impression / Assessment and Plan / ED Course  I have reviewed the triage vital signs and the nursing notes.  Pertinent labs & imaging results that were available during my care of the patient were reviewed by me and considered in my medical decision making (see chart for details).     51 yo M with PMHx as above here with bright red rectal bleeding. Hgb stable, vitals stable but he continues to have bloody bowel movements in ED. 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 No signs of mets as this time. Discussed with Dr. Paulita Fujita. Will admit for trending Hgb and plan for GI evaluation. Pt updated on imaging findings, differential, and plan of care.  Final Clinical Impressions(s) /  ED Diagnoses   Final diagnoses:  Colonic mass    New Prescriptions New Prescriptions   No medications on file     Duffy Bruce, MD 07/03/17 [phone]

## 2017-07-03 NOTE — ED Notes (Signed)
Patient transported to CT 

## 2017-07-03 NOTE — ED Notes (Addendum)
GIPR +shiga like toxin producing ecoli (STEC) per [place], Dr. Marily Memos notified

## 2017-07-03 NOTE — ED Notes (Signed)
MD at bedside. 

## 2017-07-03 NOTE — Consult Note (Addendum)
Referring Provider : Dr. Ellender Hose Primary Care Physician:  Eulas Post, MD Primary Gastroenterologist:  Althia Forts  Reason for Consultation:  Rectal bleeding, abnormal CT showing possible rectal mass  HPI: Paul [name] is a 51 y.o. male with past medical history of GERD, history of dysphagia with EGD/dilatation in 2005 by Dr. Deatra Ina presented to the hospital with rectal bleeding. CT scan showed possible mass in the rectum. GI is consulted for further evaluation.  Patient seen and examined at bedside in the ER. He was complaining of intermittent loose stools for last 1 month. Described as 2-3 bowel movements per day. C/o of abdominal cramps which resolved with bowel movement. He has been noticing small amount of blood on the tissue paper as well as in the commode intermittently. Was seen by primary care physician on 06/27/2017. Rectal exam was negative. He was referred to GI as an outpatient.He  started noticing large amount of bright red blood per rectum yesterday which prompted his ER visit.  He denied any weight loss. Denied any nausea or vomiting. Acid reflux is well controlled with omeprazole.  No previous colonoscopy. No family history of colon cancer.  Past Medical History:  Diagnosis Date  . Allergy    grass, dander  . History of meningitis     History reviewed. No pertinent surgical history.  Prior to Admission medications   Medication Sig Start Date End Date Taking? Authorizing Provider  amLODipine (NORVASC) 5 MG tablet TAKE 1 TABLET DAILY 03/12/17  Yes Burchette, Alinda Sierras, MD  calcium carbonate (OS-CAL - DOSED IN MG OF ELEMENTAL CALCIUM) 1250 (500 Ca) MG tablet Take 1 tablet by mouth daily as needed (for bones).   Yes [provider]  cetirizine (ZYRTEC) 10 MG tablet Take 10 mg by mouth daily as needed for allergies.   Yes [provider]  Multiple Vitamin (MULTIVITAMIN WITH MINERALS) TABS tablet Take 1 tablet by mouth daily.   Yes [provider]  naproxen sodium (ANAPROX) 220 MG tablet Take 440-660 mg by mouth daily as needed (for headache).   Yes [provider]  Fexofenadine HCl (ALLEGRA PO) Take 1 tablet by mouth daily as needed (allergies).     [provider]  ibuprofen (ADVIL,MOTRIN) 600 MG tablet Take 1 tablet (600 mg total) by mouth every 6 (six) hours as needed for moderate pain. 03/26/15   Julianne Rice, MD  omeprazole (PRILOSEC) 40 MG capsule Take 40 mg by mouth daily as needed (heart burn).  09/09/12   Burchette, Alinda Sierras, MD    Scheduled Meds: Continuous Infusions: PRN Meds:.  Allergies as of 07/03/2017 - Review Complete 07/03/2017  Allergen Reaction Noted  . Chloroquine Itching 11/09/2011    Family History  Problem Relation Age of Onset  . Hypertension Mother   . Asthma Father   . Cancer Father        Prostate    Social History   Social History  . Marital status: Married    Spouse name: N/A  . Number of children: N/A  . Years of education: N/A   Occupational History  . Not on file.   Social History Main Topics  . Smoking status: Never Smoker  . Smokeless tobacco: Never Used  . Alcohol use No  . Drug use: No  . Sexual activity: Not on file   Other Topics Concern  . Not on file   Social History Narrative  . No narrative on file    Review of Systems: Review of Systems  Constitutional:  Negative for chills, fever, malaise/fatigue and weight loss.  HENT: Negative for ear discharge, ear pain, hearing loss and tinnitus.   Eyes: Negative for blurred vision, double vision, photophobia and pain.  Respiratory: Negative for cough, hemoptysis and sputum production.   Cardiovascular: Negative for chest pain and palpitations.  Gastrointestinal: Positive for blood in stool, diarrhea and heartburn. Negative for abdominal pain, constipation, nausea and vomiting.  Genitourinary: Negative for dysuria and urgency.  Musculoskeletal: Negative for back pain, myalgias and neck pain.  Skin:  Negative for itching and rash.  Neurological: Negative for seizures, loss of consciousness and weakness.  Endo/Heme/Allergies: Does not bruise/bleed easily.  Psychiatric/Behavioral: Negative for hallucinations and substance abuse.   Physical Exam: Vital signs: Vitals:   07/03/17 1515 07/03/17 1545  BP: (!) 143/86 135/85  Pulse: 75 76  Resp:    Temp:    SpO2: 100% 97%     Physical Exam  Constitutional: He is oriented to person, place, and time. He appears well-developed and well-nourished. No distress.  HENT:  Head: Normocephalic and atraumatic.  Mouth/Throat: Oropharynx is clear and moist. No oropharyngeal exudate.  Eyes: EOM are normal. No scleral icterus.  Neck: Normal range of motion. Neck supple. No thyromegaly present.  Cardiovascular: Normal rate, regular rhythm and normal heart sounds.   No murmur heard. Pulmonary/Chest: Effort normal and breath sounds normal. No respiratory distress.  Abdominal: Soft. Bowel sounds are normal. He exhibits no distension. There is no tenderness. There is no guarding.  Genitourinary:  Genitourinary Comments: Rectal exam showed no external hemorrhoids. I was not able to feel any rectal mass. Brown color stool on the finger.  Musculoskeletal: Normal range of motion. He exhibits no edema.  Neurological: He is alert and oriented to person, place, and time.  Skin: Skin is warm. No erythema.  Psychiatric: He has a normal mood and affect. His behavior is normal. Thought content normal.  Vitals reviewed.   GI:  Lab Results:  Recent Labs  07/03/17 0945  WBC 5.3  HGB 13.4  HCT 40.3  PLT 220   BMET  Recent Labs  07/03/17 0945  NA 138  K 4.3  CL 104  CO2 26  GLUCOSE 127*  BUN 7  CREATININE 0.90  CALCIUM 9.3   LFT  Recent Labs  07/03/17 0945  PROT 6.9  ALBUMIN 3.9  AST 29  ALT 26  ALKPHOS 67  BILITOT 0.5   PT/INR No results for input(s): LABPROT, INR in the last 72 hours.   Studies/Results: Ct Abdomen Pelvis W  Contrast  Result Date: 07/03/2017 CLINICAL DATA:  Abdominal pain.  Bloody diarrhea. EXAM: CT ABDOMEN AND PELVIS WITH CONTRAST TECHNIQUE: Multidetector CT imaging of the abdomen and pelvis was performed using the standard protocol following bolus administration of intravenous contrast. CONTRAST:  169mL ISOVUE-300 IOPAMIDOL (ISOVUE-300) INJECTION 61% COMPARISON:  None. FINDINGS: Lower chest: Lung bases clear.  Heart size within normal limits Hepatobiliary: Normal liver.  Gallbladder and bile ducts normal Pancreas: Negative Spleen: Negative Adrenals/Urinary Tract: Normal kidneys. No renal mass or obstruction or stone. Normal bladder Stomach/Bowel: Stomach and small bowel normal. Negative for bowel obstruction. Normal appendix. Nondilated colon. Possible soft tissue mass in the mid rectum which could represent neoplasm. Correlate with endoscopy. Vascular/Lymphatic: Negative Reproductive: Mild prostate enlargement. 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 Endoscopy recommended for further evaluation. Otherwise negative. Electronically Signed   By: Franchot Gallo M.D.   On: 07/03/2017  14:24    Impression/Plan: - Rectal bleeding with CT scan showing possible soft tissue mass in the mid rectum. Normal CBC and CMP. - GERD.  Recommendations -------------------------- - Okay to have full liquid diet today. - Consider starting prep for colonoscopy tomorrow for possible colonoscopy on Thursday. Risk benefits and alternative of the procedure discussed with the patient. He verbalized understanding. - GI will follow.    LOS: 0 days   Otis Brace  MD, FACP 07/03/2017, 4:19 PM  Pager [phone] If no answer or after 5 PM call [phone]

## 2017-07-03 NOTE — ED Triage Notes (Signed)
PT is here with diarrhea for one month now and started seeing blood on it.  Since yesterday he is seeing only blood, bright red blood and abdominal pain all over. No blood thinners.  No near syncope

## 2017-07-03 NOTE — ED Notes (Signed)
Dinner has been delivered

## 2017-07-03 NOTE — H&P (Addendum)
History and Physical    Paul [name] JKD:[id number] DOB: 1966/01/20 DOA: 07/03/2017  PCP: Eulas Post, MD Patient coming from: home  Chief Complaint: bloody diarrhea  HPI: Paul [name] is a 51 y.o. male with medical history significant of meningitis in 2007.   Approximate 4wk history of watery diarrhea and abdominal cramping. Associated with anorexia and weight loss. Over the last few days prior to admission bowel movements became bloody. Patient on blood thinners and does not use a significant amount of NSAIDs. Denies any fevers, dysuria, frequency, chest pain, palpitations, neck stiffness, focal neurological deficits. No history of colonoscopy or other GI evaluation.    ED Course: objective findings outlined below.   Review of Systems: As per HPI otherwise all other systems reviewed and are negative  Ambulatory Status: no restrictions.   Past Medical History:  Diagnosis Date  . Allergy    grass, dander  . GERD (gastroesophageal reflux disease)   . History of meningitis   . Hypertension     History reviewed. No pertinent surgical history.  Social History   Social History  . Marital status: Married    Spouse name: N/A  . Number of children: N/A  . Years of education: N/A   Occupational History  . Not on file.   Social History Main Topics  . Smoking status: Never Smoker  . Smokeless tobacco: Never Used  . Alcohol use No  . Drug use: No  . Sexual activity: Not on file   Other Topics Concern  . Not on file   Social History Narrative  . No narrative on file    Allergies  Allergen Reactions  . Chloroquine Itching    Family History  Problem Relation Age of Onset  . Hypertension Mother   . Asthma Father   . Cancer Father        Prostate    Prior to Admission medications   Medication Sig Start Date End Date Taking? Authorizing Provider  amLODipine (NORVASC) 5 MG tablet TAKE 1 TABLET DAILY 03/12/17  Yes Burchette, Alinda Sierras, MD  calcium  carbonate (OS-CAL - DOSED IN MG OF ELEMENTAL CALCIUM) 1250 (500 Ca) MG tablet Take 1 tablet by mouth daily as needed (for bones).   Yes [provider]  cetirizine (ZYRTEC) 10 MG tablet Take 10 mg by mouth daily as needed for allergies.   Yes [provider]  Multiple Vitamin (MULTIVITAMIN WITH MINERALS) TABS tablet Take 1 tablet by mouth daily.   Yes [provider]  naproxen sodium (ANAPROX) 220 MG tablet Take 440-660 mg by mouth daily as needed (for headache).   Yes [provider]  Fexofenadine HCl (ALLEGRA PO) Take 1 tablet by mouth daily as needed (allergies).     [provider]  ibuprofen (ADVIL,MOTRIN) 600 MG tablet Take 1 tablet (600 mg total) by mouth every 6 (six) hours as needed for moderate pain. 03/26/15   Julianne Rice, MD  omeprazole (PRILOSEC) 40 MG capsule Take 40 mg by mouth daily as needed (heart burn).  09/09/12   Eulas Post, MD    Physical Exam: Vitals:   07/03/17 1600 07/03/17 1615 07/03/17 1700 07/03/17 1730  BP: (!) 146/94 (!) 148/98 (!) 131/91 (!) 116/103  Pulse: 82 66 78 79  Resp:      Temp:      TempSrc:      SpO2: 100% 97% 100% 97%  Weight:      Height:         General:  Appears calm and comfortable Eyes:  PERRL, EOMI, normal lids, iris ENT:  grossly normal hearing, lips & tongue, mmm Neck:  no LAD, masses or thyromegaly Cardiovascular:  RRR, no m/r/g. No LE edema.  Respiratory:  CTA bilaterally, no w/r/r. Normal respiratory effort. Abdomen:  soft, ntnd, NABS Skin:  no rash or induration seen on limited exam Musculoskeletal:  grossly normal tone BUE/BLE, good ROM, no bony abnormality Psychiatric:  grossly normal mood and affect, speech fluent and appropriate, AOx3 Neurologic:  CN 2-12 grossly intact, moves all extremities in coordinated fashion, sensation intact  Labs on Admission: I have personally reviewed following labs and imaging studies  CBC:  Recent Labs Lab 06/27/17 0927 07/03/17 0945   WBC 4.1 5.3  NEUTROABS 2.3  --   HGB 13.4 13.4  HCT 41.0 40.3  MCV 88.1 84.0  PLT 214.0 767   Basic Metabolic Panel:  Recent Labs Lab 07/03/17 0945  NA 138  K 4.3  CL 104  CO2 26  GLUCOSE 127*  BUN 7  CREATININE 0.90  CALCIUM 9.3   GFR: Estimated Creatinine Clearance: 112.2 mL/min (by C-G formula based on SCr of 0.9 mg/dL). Liver Function Tests:  Recent Labs Lab 07/03/17 0945  AST 29  ALT 26  ALKPHOS 67  BILITOT 0.5  PROT 6.9  ALBUMIN 3.9   No results for input(s): LIPASE, AMYLASE in the last 168 hours. No results for input(s): AMMONIA in the last 168 hours. Coagulation Profile: No results for input(s): INR, PROTIME in the last 168 hours. Cardiac Enzymes: No results for input(s): CKTOTAL, CKMB, CKMBINDEX, TROPONINI in the last 168 hours. BNP (last 3 results) No results for input(s): PROBNP in the last 8760 hours. HbA1C: No results for input(s): HGBA1C in the last 72 hours. CBG: No results for input(s): GLUCAP in the last 168 hours. Lipid Profile: No results for input(s): CHOL, HDL, LDLCALC, TRIG, CHOLHDL, LDLDIRECT in the last 72 hours. Thyroid Function Tests: No results for input(s): TSH, T4TOTAL, FREET4, T3FREE, THYROIDAB in the last 72 hours. Anemia Panel: No results for input(s): VITAMINB12, FOLATE, FERRITIN, TIBC, IRON, RETICCTPCT in the last 72 hours. Urine analysis: No results found for: COLORURINE, APPEARANCEUR, LABSPEC, PHURINE, GLUCOSEU, HGBUR, BILIRUBINUR, KETONESUR, PROTEINUR, UROBILINOGEN, NITRITE, LEUKOCYTESUR  Creatinine Clearance: Estimated Creatinine Clearance: 112.2 mL/min (by C-G formula based on SCr of 0.9 mg/dL).  Sepsis Labs: @LABRCNTIP (procalcitonin:4,lacticidven:4) ) Recent Results (from the past 240 hour(s))  Gastrointestinal Panel by PCR , Stool     Status: Abnormal   Collection Time: 07/03/17 12:25 PM  Result Value Ref Range Status   Campylobacter species NOT DETECTED NOT DETECTED Final   Plesimonas shigelloides NOT  DETECTED NOT DETECTED Final   Salmonella species NOT DETECTED NOT DETECTED Final   Yersinia enterocolitica NOT DETECTED NOT DETECTED Final   Vibrio species NOT DETECTED NOT DETECTED Final   Vibrio cholerae NOT DETECTED NOT DETECTED Final   Enteroaggregative E coli (EAEC) NOT DETECTED NOT DETECTED Final   Enterotoxigenic E coli (ETEC) NOT DETECTED NOT DETECTED Final   Shiga like toxin producing E coli (STEC) DETECTED (A) NOT DETECTED Final    Comment: RESULT CALLED TO, READ BACK BY AND VERIFIED WITH: CRICKET BENNING 07/03/17 1638 KLW    E. coli O157 NOT DETECTED NOT DETECTED Final   Shigella/Enteroinvasive E coli (EIEC) NOT DETECTED NOT DETECTED Final   Cryptosporidium NOT DETECTED NOT DETECTED Final   Cyclospora cayetanensis NOT DETECTED NOT DETECTED Final   Entamoeba histolytica NOT DETECTED NOT DETECTED Final   Giardia lamblia NOT DETECTED  NOT DETECTED Final   Adenovirus F40/41 NOT DETECTED NOT DETECTED Final   Astrovirus NOT DETECTED NOT DETECTED Final   Norovirus GI/GII NOT DETECTED NOT DETECTED Final   Rotavirus A NOT DETECTED NOT DETECTED Final   Sapovirus (I, II, IV, and V) NOT DETECTED NOT DETECTED Final     Radiological Exams on Admission: Ct Abdomen Pelvis W Contrast  Result Date: 07/03/2017 CLINICAL DATA:  Abdominal pain.  Bloody diarrhea. EXAM: CT ABDOMEN AND PELVIS WITH CONTRAST TECHNIQUE: Multidetector CT imaging of the abdomen and pelvis was performed using the standard protocol following bolus administration of intravenous contrast. CONTRAST:  137mL ISOVUE-300 IOPAMIDOL (ISOVUE-300) INJECTION 61% COMPARISON:  None. FINDINGS: Lower chest: Lung bases clear.  Heart size within normal limits Hepatobiliary: Normal liver.  Gallbladder and bile ducts normal Pancreas: Negative Spleen: Negative Adrenals/Urinary Tract: Normal kidneys. No renal mass or obstruction or stone. Normal bladder Stomach/Bowel: Stomach and small bowel normal. Negative for bowel obstruction. Normal appendix.  Nondilated colon. Possible soft tissue mass in the mid rectum which could represent neoplasm. Correlate with endoscopy. Vascular/Lymphatic: Negative Reproductive: Mild prostate enlargement. 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 Endoscopy recommended for further evaluation. Otherwise negative. Electronically Signed   By: Franchot Gallo M.D.   On: 07/03/2017 14:24    Assessment/Plan Active Problems:   GERD (gastroesophageal reflux disease)   Colonic mass   Essential hypertension   STEC (Shiga toxin-producing Escherichia coli)   Colonic Mass: noted on CT as above. GI consulted by EDP. Concern for malignancy. Fortunately no metastatic disease seen on imaging.  - GI consult and likely colonoscopy for biopsy.  - No further imagine for staging at this point.  - CEA  STEC: stool pathogen panel + for STEC. Likely causing pts 4 wks of loose stool. No need for ABX at this time.  - IVF for dehydration - probiotic  HTN: - continue antihypertensives  GERD: - Hold PPI   DVT prophylaxis: scd  Code Status: full  Family Communication: friend  Disposition Plan: pending workup of mass  Consults called: GI  Admission status: observation    Madai Nuccio J MD [facility]  If 7PM-7AM, please contact night-coverage [email or web address] Password [email or web address]  07/03/2017, 5:50 PM

## 2017-07-03 NOTE — ED Notes (Signed)
Admitting at bedside 

## 2017-07-04 DIAGNOSIS — E860 Dehydration: Secondary | ICD-10-CM | POA: Diagnosis present

## 2017-07-04 DIAGNOSIS — A498 Other bacterial infections of unspecified site: Secondary | ICD-10-CM

## 2017-07-04 DIAGNOSIS — Z79899 Other long term (current) drug therapy: Secondary | ICD-10-CM | POA: Diagnosis not present

## 2017-07-04 DIAGNOSIS — K648 Other hemorrhoids: Secondary | ICD-10-CM | POA: Diagnosis present

## 2017-07-04 DIAGNOSIS — R630 Anorexia: Secondary | ICD-10-CM | POA: Diagnosis present

## 2017-07-04 DIAGNOSIS — Z8249 Family history of ischemic heart disease and other diseases of the circulatory system: Secondary | ICD-10-CM | POA: Diagnosis not present

## 2017-07-04 DIAGNOSIS — K639 Disease of intestine, unspecified: Secondary | ICD-10-CM | POA: Diagnosis present

## 2017-07-04 DIAGNOSIS — I10 Essential (primary) hypertension: Secondary | ICD-10-CM | POA: Diagnosis not present

## 2017-07-04 DIAGNOSIS — Z8042 Family history of malignant neoplasm of prostate: Secondary | ICD-10-CM | POA: Diagnosis not present

## 2017-07-04 DIAGNOSIS — Z825 Family history of asthma and other chronic lower respiratory diseases: Secondary | ICD-10-CM | POA: Diagnosis not present

## 2017-07-04 DIAGNOSIS — R197 Diarrhea, unspecified: Secondary | ICD-10-CM | POA: Diagnosis present

## 2017-07-04 DIAGNOSIS — B9623 Unspecified Shiga toxin-producing Escherichia coli [E. coli] (STEC) as the cause of diseases classified elsewhere: Secondary | ICD-10-CM | POA: Diagnosis present

## 2017-07-04 DIAGNOSIS — C19 Malignant neoplasm of rectosigmoid junction: Secondary | ICD-10-CM | POA: Diagnosis present

## 2017-07-04 DIAGNOSIS — K219 Gastro-esophageal reflux disease without esophagitis: Secondary | ICD-10-CM | POA: Diagnosis not present

## 2017-07-04 LAB — COMPREHENSIVE METABOLIC PANEL
ALT: 23 U/L (ref 17–63)
ANION GAP: 4 — AB (ref 5–15)
AST: 21 U/L (ref 15–41)
Albumin: 3.5 g/dL (ref 3.5–5.0)
Alkaline Phosphatase: 59 U/L (ref 38–126)
BILIRUBIN TOTAL: 0.7 mg/dL (ref 0.3–1.2)
BUN: 5 mg/dL — ABNORMAL LOW (ref 6–20)
CO2: 27 mmol/L (ref 22–32)
Calcium: 8.8 mg/dL — ABNORMAL LOW (ref 8.9–10.3)
Chloride: 106 mmol/L (ref 101–111)
Creatinine, Ser: 0.76 mg/dL (ref 0.61–1.24)
GFR calc Af Amer: >60 mL/min (ref >60)
Glucose, Bld: 110 mg/dL — ABNORMAL HIGH (ref 65–99)
POTASSIUM: 3.6 mmol/L (ref 3.5–5.1)
Sodium: 137 mmol/L (ref 135–145)
TOTAL PROTEIN: 6.2 g/dL — AB (ref 6.5–8.1)

## 2017-07-04 LAB — CEA: CEA1: 2.7 ng/mL (ref 0.0–4.7)

## 2017-07-04 LAB — CBC
HEMATOCRIT: 39.6 % (ref 39.0–52.0)
Hemoglobin: 13.3 g/dL (ref 13.0–17.0)
MCH: 28 pg (ref 26.0–34.0)
MCHC: 33.6 g/dL (ref 30.0–36.0)
MCV: 83.4 fL (ref 78.0–100.0)
PLATELETS: 194 10*3/uL (ref 150–400)
RBC: 4.75 MIL/uL (ref 4.22–5.81)
RDW: 13.2 % (ref 11.5–15.5)
WBC: 4.3 10*3/uL (ref 4.0–10.5)

## 2017-07-04 MED ORDER — PEG-KCL-NACL-NASULF-NA ASC-C 100 G PO SOLR: 1.0000 | Freq: Once | ORAL | Status: DC

## 2017-07-04 MED ORDER — PEG-KCL-NACL-NASULF-NA ASC-C 100 G PO SOLR
0.5000 | Freq: Once | ORAL | Status: AC
Start: 2017-07-05 — End: 2017-07-05
  Administered 2017-07-05: 100 g via ORAL
  Filled 2017-07-04: qty 1

## 2017-07-04 MED ORDER — BISACODYL 5 MG PO TBEC
10.0000 mg | DELAYED_RELEASE_TABLET | Freq: Once | ORAL | Status: AC
  Administered 2017-07-04: 10 mg via ORAL
  Filled 2017-07-04: qty 2

## 2017-07-04 MED ORDER — SODIUM CHLORIDE 0.9 % IV SOLN
INTRAVENOUS | Status: DC
  Administered 2017-07-04 – 2017-07-05 (×2): via INTRAVENOUS

## 2017-07-04 MED ORDER — PEG-KCL-NACL-NASULF-NA ASC-C 100 G PO SOLR
0.5000 | Freq: Once | ORAL | Status: AC
Start: 2017-07-04 — End: 2017-07-04
  Administered 2017-07-04: 100 g via ORAL
  Filled 2017-07-04: qty 1

## 2017-07-04 NOTE — Progress Notes (Signed)
Subjective: No further bleeding.  Objective: Vital signs in last 24 hours: Temp:  [98.1 F (36.7 C)] 98.1 F (36.7 C) (09/12 0631) Pulse Rate:  [65-82] 65 (09/12 0631) Resp:  [16-18] 18 (09/12 0631) BP: (107-149)/(73-104) 119/81 (09/12 0631) SpO2:  [96 %-100 %] 100 % (09/12 0631) Weight:  [189 lb 14.4 oz (86.1 kg)-196 lb (88.9 kg)] 189 lb 14.4 oz (86.1 kg) (09/12 0238) Weight change:  Last BM Date: 07/04/17  PE: GEN:  NAD ABD:  Soft, non-tender  Lab Results: CBC    Component Value Date/Time   WBC 4.3 07/04/2017 0614   RBC 4.75 07/04/2017 0614   HGB 13.3 07/04/2017 0614   HCT 39.6 07/04/2017 0614   PLT 194 07/04/2017 0614   MCV 83.4 07/04/2017 0614   MCH 28.0 07/04/2017 0614   MCHC 33.6 07/04/2017 0614   RDW 13.2 07/04/2017 0614   LYMPHSABS 1.2 06/27/2017 0927   MONOABS 0.4 06/27/2017 0927   EOSABS 0.3 06/27/2017 0927   BASOSABS 0.0 06/27/2017 0927   CMP     Component Value Date/Time   NA 137 07/04/2017 0614   K 3.6 07/04/2017 0614   CL 106 07/04/2017 0614   CO2 27 07/04/2017 0614   GLUCOSE 110 (H) 07/04/2017 0614   BUN 5 (L) 07/04/2017 0614   CREATININE 0.76 07/04/2017 0614   CALCIUM 8.8 (L) 07/04/2017 0614   PROT 6.2 (L) 07/04/2017 0614   ALBUMIN 3.5 07/04/2017 0614   AST 21 07/04/2017 0614   ALT 23 07/04/2017 0614   ALKPHOS 59 07/04/2017 0614   BILITOT 0.7 07/04/2017 0614   GFRNONAA >60 07/04/2017 0614   GFRAA >60 07/04/2017 4098   Assessment:  1.  Several weeks' crampy abdominal pain. 2.  Two weeks' hematochezia. 3.  Possible rectal mass on CT, not palpable on digital rectal exam.  Plan:  1.  Colonoscopy tomorrow for evaluation. 2.  Risks (bleeding, infection, bowel perforation that could require surgery, sedation-related changes in cardiopulmonary systems), benefits (identification and possible treatment of source of symptoms, exclusion of certain causes of symptoms), and alternatives (watchful waiting, radiographic imaging studies, empiric  medical treatment) of colonoscopy were explained to patient/family in detail and patient wishes to proceed.   Landry Dyke 07/04/2017, 9:32 AM   Cell [phone] If no answer or after 5 PM call [phone]

## 2017-07-04 NOTE — Anesthesia Preprocedure Evaluation (Signed)
Anesthesia Evaluation  Patient identified by MRN, date of birth, ID band Patient awake    Reviewed: Allergy & Precautions, NPO status , Patient's Chart, lab work & pertinent test results  Airway Mallampati: II  TM Distance: >3 FB Neck ROM: Full    Dental no notable dental hx.    Pulmonary neg pulmonary ROS,    Pulmonary exam normal breath sounds clear to auscultation       Cardiovascular hypertension, Pt. on medications negative cardio ROS Normal cardiovascular exam Rhythm:Regular Rate:Normal     Neuro/Psych negative neurological ROS  negative psych ROS   GI/Hepatic negative GI ROS, Neg liver ROS, GERD  ,  Endo/Other  negative endocrine ROS  Renal/GU negative Renal ROS  negative genitourinary   Musculoskeletal negative musculoskeletal ROS (+)   Abdominal   Peds negative pediatric ROS (+)  Hematology negative hematology ROS (+)   Anesthesia Other Findings   Reproductive/Obstetrics negative OB ROS                             Anesthesia Physical Anesthesia Plan  ASA: II  Anesthesia Plan: MAC   Post-op Pain Management:    Induction: Intravenous  PONV Risk Score and Plan: 2 and Ondansetron, Dexamethasone and Treatment may vary due to age or medical condition  Airway Management Planned: Mask and Natural Airway  Additional Equipment:   Intra-op Plan:   Post-operative Plan:   Informed Consent:   Plan Discussed with:   Anesthesia Plan Comments:         Anesthesia Quick Evaluation

## 2017-07-04 NOTE — Progress Notes (Signed)
Patient arrived to the unit via bed from the emergency department.  Patient is alert and oriented x 4.  Ambulatory. No pain. Skin assessment complete.  Iv intact to the right antecubital.  Educated the patient on how to use the call light to reach the staff. Lowered the bed and placed the call light within to reach. Will continue to monitor the patient.

## 2017-07-04 NOTE — ED Notes (Signed)
Report attempted 

## 2017-07-04 NOTE — Progress Notes (Signed)
PROGRESS NOTE    Paul [name]  OMV:[id number] DOB: [birth year] DOA: 07/03/2017 PCP: Eulas Post, MD    Brief Narrative: Paul [name] is a 51 y.o. male with medical history significant of meningitis in 2007.  Approximate 4wk history of watery diarrhea and abdominal cramping. CT abd shows colon mass. Gi consulted and plan for colonoscopy in am.   Assessment & Plan:   Active Problems:   GERD (gastroesophageal reflux disease)   Colonic mass   Essential hypertension   STEC (Shiga toxin-producing Escherichia coli)   Colonic mass:  Plan for colonoscopy in am.  GI on board and consulting.  CEA pending.     STEC:  One BM in the last 24 hours.  No bloody stools.  Gentle hydration.    Hypertension:  Well controlled.   GERD : PPI.       DVT prophylaxis: SCD's Code Status: full code.  Family Communication: none at bedside.  Disposition Plan: pending eval by GI.   Consultants:   Gastroenterology.    Procedures: colonoscopy in am.   Antimicrobials: none.   Subjective: No abd pain.   Objective: Vitals:   07/04/17 0238 07/04/17 0631 07/04/17 1123 07/04/17 1458  BP:  119/81 113/77 106/63  Pulse:  65  76  Resp:  18  18  Temp:  98.1 F (36.7 C)  98.7 F (37.1 C)  TempSrc:  Oral  Oral  SpO2:  100%  100%  Weight: 86.1 kg (189 lb 14.4 oz)     Height: '6\' 1"'  (1.854 m)       Intake/Output Summary (Last 24 hours) at 07/04/17 1841 Last data filed at 07/04/17 0351  Gross per 24 hour  Intake           947.25 ml  Output                0 ml  Net           947.25 ml   Filed Weights   07/03/17 1044 07/04/17 0238  Weight: 88.9 kg (196 lb) 86.1 kg (189 lb 14.4 oz)    Examination:  General exam: Appears calm and comfortable  Respiratory system: Clear to auscultation. Respiratory effort normal. Cardiovascular system: S1 & S2 heard, RRR. No JVD, murmurs, rubs, gallops or clicks. No pedal edema. Gastrointestinal system: Abdomen is nondistended, soft and  nontender. No organomegaly or masses felt. Normal bowel sounds heard. Central nervous system: Alert and oriented. No focal neurological deficits. Extremities: Symmetric 5 x 5 power. Skin: No rashes, lesions or ulcers Psychiatry: Judgement and insight appear normal. Mood & affect appropriate.     Data Reviewed: I have personally reviewed following labs and imaging studies  CBC:  Recent Labs Lab 07/03/17 0945 07/04/17 0614  WBC 5.3 4.3  HGB 13.4 13.3  HCT 40.3 39.6  MCV 84.0 83.4  PLT 220 628   Basic Metabolic Panel:  Recent Labs Lab 07/03/17 0945 07/04/17 0614  NA 138 137  K 4.3 3.6  CL 104 106  CO2 26 27  GLUCOSE 127* 110*  BUN 7 5*  CREATININE 0.90 0.76  CALCIUM 9.3 8.8*   GFR: Estimated Creatinine Clearance: 126.2 mL/min (by C-G formula based on SCr of 0.76 mg/dL). Liver Function Tests:  Recent Labs Lab 07/03/17 0945 07/04/17 0614  AST 29 21  ALT 26 23  ALKPHOS 67 59  BILITOT 0.5 0.7  PROT 6.9 6.2*  ALBUMIN 3.9 3.5   No results for input(s): LIPASE, AMYLASE in the last 168 hours.  No results for input(s): AMMONIA in the last 168 hours. Coagulation Profile: No results for input(s): INR, PROTIME in the last 168 hours. Cardiac Enzymes: No results for input(s): CKTOTAL, CKMB, CKMBINDEX, TROPONINI in the last 168 hours. BNP (last 3 results) No results for input(s): PROBNP in the last 8760 hours. HbA1C: No results for input(s): HGBA1C in the last 72 hours. CBG: No results for input(s): GLUCAP in the last 168 hours. Lipid Profile: No results for input(s): CHOL, HDL, LDLCALC, TRIG, CHOLHDL, LDLDIRECT in the last 72 hours. Thyroid Function Tests: No results for input(s): TSH, T4TOTAL, FREET4, T3FREE, THYROIDAB in the last 72 hours. Anemia Panel: No results for input(s): VITAMINB12, FOLATE, FERRITIN, TIBC, IRON, RETICCTPCT in the last 72 hours. Sepsis Labs: No results for input(s): PROCALCITON, LATICACIDVEN in the last 168 hours.  Recent Results (from  the past 240 hour(s))  Gastrointestinal Panel by PCR , Stool     Status: Abnormal   Collection Time: 07/03/17 12:25 PM  Result Value Ref Range Status   Campylobacter species NOT DETECTED NOT DETECTED Final   Plesimonas shigelloides NOT DETECTED NOT DETECTED Final   Salmonella species NOT DETECTED NOT DETECTED Final   Yersinia enterocolitica NOT DETECTED NOT DETECTED Final   Vibrio species NOT DETECTED NOT DETECTED Final   Vibrio cholerae NOT DETECTED NOT DETECTED Final   Enteroaggregative E coli (EAEC) NOT DETECTED NOT DETECTED Final   Enterotoxigenic E coli (ETEC) NOT DETECTED NOT DETECTED Final   Shiga like toxin producing E coli (STEC) DETECTED (A) NOT DETECTED Final    Comment: RESULT CALLED TO, READ BACK BY AND VERIFIED WITH: CRICKET BENNING 07/03/17 1638 KLW    E. coli O157 NOT DETECTED NOT DETECTED Final   Shigella/Enteroinvasive E coli (EIEC) NOT DETECTED NOT DETECTED Final   Cryptosporidium NOT DETECTED NOT DETECTED Final   Cyclospora cayetanensis NOT DETECTED NOT DETECTED Final   Entamoeba histolytica NOT DETECTED NOT DETECTED Final   Giardia lamblia NOT DETECTED NOT DETECTED Final   Adenovirus F40/41 NOT DETECTED NOT DETECTED Final   Astrovirus NOT DETECTED NOT DETECTED Final   Norovirus GI/GII NOT DETECTED NOT DETECTED Final   Rotavirus A NOT DETECTED NOT DETECTED Final   Sapovirus (I, II, IV, and V) NOT DETECTED NOT DETECTED Final         Radiology Studies: Ct Abdomen Pelvis W Contrast  Result Date: 07/03/2017 CLINICAL DATA:  Abdominal pain.  Bloody diarrhea. EXAM: CT ABDOMEN AND PELVIS WITH CONTRAST TECHNIQUE: Multidetector CT imaging of the abdomen and pelvis was performed using the standard protocol following bolus administration of intravenous contrast. CONTRAST:  110m ISOVUE-300 IOPAMIDOL (ISOVUE-300) INJECTION 61% COMPARISON:  None. FINDINGS: Lower chest: Lung bases clear.  Heart size within normal limits Hepatobiliary: Normal liver.  Gallbladder and bile ducts  normal Pancreas: Negative Spleen: Negative Adrenals/Urinary Tract: Normal kidneys. No renal mass or obstruction or stone. Normal bladder Stomach/Bowel: Stomach and small bowel normal. Negative for bowel obstruction. Normal appendix. Nondilated colon. Possible soft tissue mass in the mid rectum which could represent neoplasm. Correlate with endoscopy. Vascular/Lymphatic: Negative Reproductive: Mild prostate enlargement. 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 Endoscopy recommended for further evaluation. Otherwise negative. Electronically Signed   By: CFranchot GalloM.D.   On: 07/03/2017 14:24        Scheduled Meds: . acidophilus  2 capsule Oral BID  . amLODipine  5 mg Oral Daily  . bisacodyl  10 mg Oral Once  . [  START ON 07/05/2017] peg 3350 powder  0.5 kit Oral Once   Continuous Infusions: . sodium chloride 75 mL/hr at 07/04/17 0217     LOS: 0 days    Time spent: 25 minutes     Sylvestre Rathgeber, MD [facility] Pager [phone]  If 7PM-7AM, please contact night-coverage [email or web address] Password [facility] 07/04/2017, 6:41 PM

## 2017-07-05 ENCOUNTER — Encounter (HOSPITAL_COMMUNITY): Payer: Self-pay | Admitting: *Deleted

## 2017-07-05 ENCOUNTER — Inpatient Hospital Stay (HOSPITAL_COMMUNITY): Payer: BLUE CROSS/BLUE SHIELD | Admitting: Anesthesiology

## 2017-07-05 ENCOUNTER — Encounter (HOSPITAL_COMMUNITY)
Admission: EM | Disposition: A | Discharge status: Home / Self Care | Payer: Self-pay | Source: Home / Self Care | Attending: Internal Medicine

## 2017-07-05 HISTORY — PX: COLONOSCOPY WITH PROPOFOL: SHX5780

## 2017-07-05 LAB — BASIC METABOLIC PANEL
Anion gap: 5 (ref 5–15)
BUN: 5 mg/dL — AB (ref 6–20)
CALCIUM: 8.6 mg/dL — AB (ref 8.9–10.3)
CO2: 28 mmol/L (ref 22–32)
CREATININE: 0.83 mg/dL (ref 0.61–1.24)
Chloride: 106 mmol/L (ref 101–111)
GFR calc Af Amer: >60 mL/min (ref >60)
Glucose, Bld: 104 mg/dL — ABNORMAL HIGH (ref 65–99)
Potassium: 3.5 mmol/L (ref 3.5–5.1)
SODIUM: 139 mmol/L (ref 135–145)

## 2017-07-05 SURGERY — COLONOSCOPY WITH PROPOFOL
Anesthesia: Monitor Anesthesia Care | Laterality: Left

## 2017-07-05 MED ORDER — FENTANYL CITRATE (PF) 100 MCG/2ML IJ SOLN: 25.0000 ug | INTRAMUSCULAR | Status: DC | PRN

## 2017-07-05 MED ORDER — LACTATED RINGERS IV SOLN
INTRAVENOUS | Status: DC | PRN
  Administered 2017-07-05: 10:00:00 via INTRAVENOUS

## 2017-07-05 MED ORDER — PROPOFOL 10 MG/ML IV BOLUS
INTRAVENOUS | Status: DC | PRN
  Administered 2017-07-05: 40 mg via INTRAVENOUS
  Administered 2017-07-05: 100 mg via INTRAVENOUS
  Administered 2017-07-05: 30 mg via INTRAVENOUS
  Administered 2017-07-05: 50 mg via INTRAVENOUS
  Administered 2017-07-05: 30 mg via INTRAVENOUS
  Administered 2017-07-05: 50 mg via INTRAVENOUS
  Administered 2017-07-05: 60 mg via INTRAVENOUS

## 2017-07-05 MED ORDER — MEPERIDINE HCL 25 MG/ML IJ SOLN: 6.2500 mg | INTRAMUSCULAR | Status: DC | PRN

## 2017-07-05 MED ORDER — SPOT INK MARKER SYRINGE KIT
PACK | SUBMUCOSAL | Status: AC
  Filled 2017-07-05: qty 5

## 2017-07-05 MED ORDER — LACTATED RINGERS IV SOLN
INTRAVENOUS | Status: DC
  Administered 2017-07-05: 1000 mL via INTRAVENOUS

## 2017-07-05 MED ORDER — PHENYLEPHRINE 40 MCG/ML (10ML) SYRINGE FOR IV PUSH (FOR BLOOD PRESSURE SUPPORT)
PREFILLED_SYRINGE | INTRAVENOUS | Status: DC | PRN
  Administered 2017-07-05 (×2): 80 ug via INTRAVENOUS

## 2017-07-05 MED ORDER — LIDOCAINE 2% (20 MG/ML) 5 ML SYRINGE
INTRAMUSCULAR | Status: DC | PRN
  Administered 2017-07-05: 50 mg via INTRAVENOUS

## 2017-07-05 MED ORDER — ONDANSETRON HCL 4 MG/2ML IJ SOLN: 4.0000 mg | Freq: Once | INTRAMUSCULAR | Status: DC | PRN

## 2017-07-05 MED ORDER — SPOT INK MARKER SYRINGE KIT
PACK | SUBMUCOSAL | Status: DC | PRN
  Administered 2017-07-05: 1 mL via SUBMUCOSAL

## 2017-07-05 SURGICAL SUPPLY — 22 items

## 2017-07-05 NOTE — Progress Notes (Signed)
PROGRESS NOTE    Paul [name]  AST:[id number] DOB: 1966/05/14 DOA: 07/03/2017 PCP: Eulas Post, MD    Brief Narrative: Paul [name] is a 51 y.o. male with medical history significant of meningitis in 2007.  Approximate 4wk history of watery diarrhea and abdominal cramping. CT abd shows colon mass. Gi consulted and plan for colonoscopy in am.   Assessment & Plan:   Active Problems:   GERD (gastroesophageal reflux disease)   Colonic mass   Essential hypertension   STEC (Shiga toxin-producing Escherichia coli)   Colonic mass:  S/p colonoscopy showing fungating mass, sessile, ulcerated mass, likely malignant.  Surgery consulted. And MRI  Of the pelvis ordered.  GI on board and consulting.  CEA pending.  Clear liquids today and advance diet in am.     STEC:  One BM in the last 24 hours.  No bloody stools.  Gentle hydration.    Hypertension:  Well controlled.   GERD : PPI.       DVT prophylaxis: SCD's Code Status: full code.  Family Communication: none at bedside.  Disposition Plan: home in 1 to 2 days.   Consultants:   Gastroenterology.    Procedures: colonoscopy in am.   Antimicrobials: none.   Subjective: No abd pain. No nausea or vomiting.   Objective: Vitals:   07/05/17 1104 07/05/17 1105 07/05/17 1110 07/05/17 1252  BP:   122/85 124/87  Pulse: 73 76 76 72  Resp: (!) 24 15 15 16   Temp:    98.2 F (36.8 C)  TempSrc:    Oral  SpO2: 100% 100% 100% 100%  Weight:      Height:        Intake/Output Summary (Last 24 hours) at 07/05/17 1847 Last data filed at 07/05/17 1055  Gross per 24 hour  Intake              600 ml  Output                0 ml  Net              600 ml   Filed Weights   07/03/17 1044 07/04/17 0238  Weight: 88.9 kg (196 lb) 86.1 kg (189 lb 14.4 oz)    Examination: unchanged from yesterday.   General exam: Appears calm and comfortable ,not in distress.  Respiratory system: Clear to auscultation. Respiratory  effort normal. Cardiovascular system: S1 & S2 heard, RRR. No JVD, murmurs, rubs, gallops or clicks. No pedal edema. Gastrointestinal system: Abdomen is nondistended, soft and nontender. No organomegaly or masses felt. Normal bowel sounds heard. Central nervous system: Alert and oriented. No focal neurological deficits. Extremities: Symmetric 5 x 5 power. Skin: No rashes, lesions or ulcers Psychiatry: Judgement and insight appear normal. Mood & affect appropriate.     Data Reviewed: I have personally reviewed following labs and imaging studies  CBC:  Recent Labs Lab 07/03/17 0945 07/04/17 0614  WBC 5.3 4.3  HGB 13.4 13.3  HCT 40.3 39.6  MCV 84.0 83.4  PLT 220 989   Basic Metabolic Panel:  Recent Labs Lab 07/03/17 0945 07/04/17 0614 07/05/17 0527  NA 138 137 139  K 4.3 3.6 3.5  CL 104 106 106  CO2 26 27 28   GLUCOSE 127* 110* 104*  BUN 7 5* 5*  CREATININE 0.90 0.76 0.83  CALCIUM 9.3 8.8* 8.6*   GFR: Estimated Creatinine Clearance: 121.7 mL/min (by C-G formula based on SCr of 0.83 mg/dL). Liver Function Tests:  Recent  Labs Lab 07/03/17 0945 07/04/17 0614  AST 29 21  ALT 26 23  ALKPHOS 67 59  BILITOT 0.5 0.7  PROT 6.9 6.2*  ALBUMIN 3.9 3.5   No results for input(s): LIPASE, AMYLASE in the last 168 hours. No results for input(s): AMMONIA in the last 168 hours. Coagulation Profile: No results for input(s): INR, PROTIME in the last 168 hours. Cardiac Enzymes: No results for input(s): CKTOTAL, CKMB, CKMBINDEX, TROPONINI in the last 168 hours. BNP (last 3 results) No results for input(s): PROBNP in the last 8760 hours. HbA1C: No results for input(s): HGBA1C in the last 72 hours. CBG: No results for input(s): GLUCAP in the last 168 hours. Lipid Profile: No results for input(s): CHOL, HDL, LDLCALC, TRIG, CHOLHDL, LDLDIRECT in the last 72 hours. Thyroid Function Tests: No results for input(s): TSH, T4TOTAL, FREET4, T3FREE, THYROIDAB in the last 72  hours. Anemia Panel: No results for input(s): VITAMINB12, FOLATE, FERRITIN, TIBC, IRON, RETICCTPCT in the last 72 hours. Sepsis Labs: No results for input(s): PROCALCITON, LATICACIDVEN in the last 168 hours.  Recent Results (from the past 240 hour(s))  Gastrointestinal Panel by PCR , Stool     Status: Abnormal   Collection Time: 07/03/17 12:25 PM  Result Value Ref Range Status   Campylobacter species NOT DETECTED NOT DETECTED Final   Plesimonas shigelloides NOT DETECTED NOT DETECTED Final   Salmonella species NOT DETECTED NOT DETECTED Final   Yersinia enterocolitica NOT DETECTED NOT DETECTED Final   Vibrio species NOT DETECTED NOT DETECTED Final   Vibrio cholerae NOT DETECTED NOT DETECTED Final   Enteroaggregative E coli (EAEC) NOT DETECTED NOT DETECTED Final   Enterotoxigenic E coli (ETEC) NOT DETECTED NOT DETECTED Final   Shiga like toxin producing E coli (STEC) DETECTED (A) NOT DETECTED Final    Comment: RESULT CALLED TO, READ BACK BY AND VERIFIED WITH: CRICKET BENNING 07/03/17 1638 KLW    E. coli O157 NOT DETECTED NOT DETECTED Final   Shigella/Enteroinvasive E coli (EIEC) NOT DETECTED NOT DETECTED Final   Cryptosporidium NOT DETECTED NOT DETECTED Final   Cyclospora cayetanensis NOT DETECTED NOT DETECTED Final   Entamoeba histolytica NOT DETECTED NOT DETECTED Final   Giardia lamblia NOT DETECTED NOT DETECTED Final   Adenovirus F40/41 NOT DETECTED NOT DETECTED Final   Astrovirus NOT DETECTED NOT DETECTED Final   Norovirus GI/GII NOT DETECTED NOT DETECTED Final   Rotavirus A NOT DETECTED NOT DETECTED Final   Sapovirus (I, II, IV, and V) NOT DETECTED NOT DETECTED Final         Radiology Studies: No results found.      Scheduled Meds: . acidophilus  2 capsule Oral BID  . amLODipine  5 mg Oral Daily   Continuous Infusions: . sodium chloride 100 mL/hr at 07/05/17 0547     LOS: 1 day    Time spent: 17 minutes     Danyela Posas, MD [facility] Pager  [phone]  If 7PM-7AM, please contact night-coverage [email or web address] Password [facility] 07/05/2017, 6:47 PM

## 2017-07-05 NOTE — H&P (View-Only) (Signed)
Referring Provider : Dr. Ellender Hose Primary Care Physician:  Eulas Post, MD Primary Gastroenterologist:  Althia Forts  Reason for Consultation:  Rectal bleeding, abnormal CT showing possible rectal mass  HPI: Paul [name] is a 51 y.o. male with past medical history of GERD, history of dysphagia with EGD/dilatation in 2005 by Dr. Deatra Ina presented to the hospital with rectal bleeding. CT scan showed possible mass in the rectum. GI is consulted for further evaluation.  Patient seen and examined at bedside in the ER. He was complaining of intermittent loose stools for last 1 month. Described as 2-3 bowel movements per day. C/o of abdominal cramps which resolved with bowel movement. He has been noticing small amount of blood on the tissue paper as well as in the commode intermittently. Was seen by primary care physician on 06/27/2017. Rectal exam was negative. He was referred to GI as an outpatient.He  started noticing large amount of bright red blood per rectum yesterday which prompted his ER visit.  He denied any weight loss. Denied any nausea or vomiting. Acid reflux is well controlled with omeprazole.  No previous colonoscopy. No family history of colon cancer.  Past Medical History:  Diagnosis Date  . Allergy    grass, dander  . History of meningitis     History reviewed. No pertinent surgical history.  Prior to Admission medications   Medication Sig Start Date End Date Taking? Authorizing Provider  amLODipine (NORVASC) 5 MG tablet TAKE 1 TABLET DAILY 03/12/17  Yes Burchette, Alinda Sierras, MD  calcium carbonate (OS-CAL - DOSED IN MG OF ELEMENTAL CALCIUM) 1250 (500 Ca) MG tablet Take 1 tablet by mouth daily as needed (for bones).   Yes [provider]  cetirizine (ZYRTEC) 10 MG tablet Take 10 mg by mouth daily as needed for allergies.   Yes [provider]  Multiple Vitamin (MULTIVITAMIN WITH MINERALS) TABS tablet Take 1 tablet by mouth daily.   Yes [provider]  naproxen sodium (ANAPROX) 220 MG tablet Take 440-660 mg by mouth daily as needed (for headache).   Yes [provider]  Fexofenadine HCl (ALLEGRA PO) Take 1 tablet by mouth daily as needed (allergies).     [provider]  ibuprofen (ADVIL,MOTRIN) 600 MG tablet Take 1 tablet (600 mg total) by mouth every 6 (six) hours as needed for moderate pain. 03/26/15   Julianne Rice, MD  omeprazole (PRILOSEC) 40 MG capsule Take 40 mg by mouth daily as needed (heart burn).  09/09/12   Burchette, Alinda Sierras, MD    Scheduled Meds: Continuous Infusions: PRN Meds:.  Allergies as of 07/03/2017 - Review Complete 07/03/2017  Allergen Reaction Noted  . Chloroquine Itching 11/09/2011    Family History  Problem Relation Age of Onset  . Hypertension Mother   . Asthma Father   . Cancer Father        Prostate    Social History   Social History  . Marital status: Married    Spouse name: N/A  . Number of children: N/A  . Years of education: N/A   Occupational History  . Not on file.   Social History Main Topics  . Smoking status: Never Smoker  . Smokeless tobacco: Never Used  . Alcohol use No  . Drug use: No  . Sexual activity: Not on file   Other Topics Concern  . Not on file   Social History Narrative  . No narrative on file    Review of Systems: Review of Systems  Constitutional:  Negative for chills, fever, malaise/fatigue and weight loss.  HENT: Negative for ear discharge, ear pain, hearing loss and tinnitus.   Eyes: Negative for blurred vision, double vision, photophobia and pain.  Respiratory: Negative for cough, hemoptysis and sputum production.   Cardiovascular: Negative for chest pain and palpitations.  Gastrointestinal: Positive for blood in stool, diarrhea and heartburn. Negative for abdominal pain, constipation, nausea and vomiting.  Genitourinary: Negative for dysuria and urgency.  Musculoskeletal: Negative for back pain, myalgias and neck pain.  Skin:  Negative for itching and rash.  Neurological: Negative for seizures, loss of consciousness and weakness.  Endo/Heme/Allergies: Does not bruise/bleed easily.  Psychiatric/Behavioral: Negative for hallucinations and substance abuse.   Physical Exam: Vital signs: Vitals:   07/03/17 1515 07/03/17 1545  BP: (!) 143/86 135/85  Pulse: 75 76  Resp:    Temp:    SpO2: 100% 97%     Physical Exam  Constitutional: He is oriented to person, place, and time. He appears well-developed and well-nourished. No distress.  HENT:  Head: Normocephalic and atraumatic.  Mouth/Throat: Oropharynx is clear and moist. No oropharyngeal exudate.  Eyes: EOM are normal. No scleral icterus.  Neck: Normal range of motion. Neck supple. No thyromegaly present.  Cardiovascular: Normal rate, regular rhythm and normal heart sounds.   No murmur heard. Pulmonary/Chest: Effort normal and breath sounds normal. No respiratory distress.  Abdominal: Soft. Bowel sounds are normal. He exhibits no distension. There is no tenderness. There is no guarding.  Genitourinary:  Genitourinary Comments: Rectal exam showed no external hemorrhoids. I was not able to feel any rectal mass. Brown color stool on the finger.  Musculoskeletal: Normal range of motion. He exhibits no edema.  Neurological: He is alert and oriented to person, place, and time.  Skin: Skin is warm. No erythema.  Psychiatric: He has a normal mood and affect. His behavior is normal. Thought content normal.  Vitals reviewed.   GI:  Lab Results:  Recent Labs  07/03/17 0945  WBC 5.3  HGB 13.4  HCT 40.3  PLT 220   BMET  Recent Labs  07/03/17 0945  NA 138  K 4.3  CL 104  CO2 26  GLUCOSE 127*  BUN 7  CREATININE 0.90  CALCIUM 9.3   LFT  Recent Labs  07/03/17 0945  PROT 6.9  ALBUMIN 3.9  AST 29  ALT 26  ALKPHOS 67  BILITOT 0.5   PT/INR No results for input(s): LABPROT, INR in the last 72 hours.   Studies/Results: Ct Abdomen Pelvis W  Contrast  Result Date: 07/03/2017 CLINICAL DATA:  Abdominal pain.  Bloody diarrhea. EXAM: CT ABDOMEN AND PELVIS WITH CONTRAST TECHNIQUE: Multidetector CT imaging of the abdomen and pelvis was performed using the standard protocol following bolus administration of intravenous contrast. CONTRAST:  138mL ISOVUE-300 IOPAMIDOL (ISOVUE-300) INJECTION 61% COMPARISON:  None. FINDINGS: Lower chest: Lung bases clear.  Heart size within normal limits Hepatobiliary: Normal liver.  Gallbladder and bile ducts normal Pancreas: Negative Spleen: Negative Adrenals/Urinary Tract: Normal kidneys. No renal mass or obstruction or stone. Normal bladder Stomach/Bowel: Stomach and small bowel normal. Negative for bowel obstruction. Normal appendix. Nondilated colon. Possible soft tissue mass in the mid rectum which could represent neoplasm. Correlate with endoscopy. Vascular/Lymphatic: Negative Reproductive: Mild prostate enlargement. 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 Endoscopy recommended for further evaluation. Otherwise negative. Electronically Signed   By: Franchot Gallo M.D.   On: 07/03/2017  14:24    Impression/Plan: - Rectal bleeding with CT scan showing possible soft tissue mass in the mid rectum. Normal CBC and CMP. - GERD.  Recommendations -------------------------- - Okay to have full liquid diet today. - Consider starting prep for colonoscopy tomorrow for possible colonoscopy on Thursday. Risk benefits and alternative of the procedure discussed with the patient. He verbalized understanding. - GI will follow.    LOS: 0 days   Otis Brace  MD, FACP 07/03/2017, 4:19 PM  Pager [phone] If no answer or after 5 PM call [phone]

## 2017-07-05 NOTE — Progress Notes (Signed)
Surgery contacted, says they will not be able to do surgery this admission. Dr. Karleen Hampshire paged to see if we can advance diet.

## 2017-07-05 NOTE — Consult Note (Signed)
[facility] Surgery Consult Note  Paul [name] 11-15-1965  [id number].    Requesting MD: Karleen Hampshire Chief Complaint/Reason for Consult: Colonic mass HPI:  Patient is a 51 y/o male who presented to [facility] with 4 week hx of loose stools and intermittent abdominal cramping. He also reports noticing bright red blood in his stools 1-2 weeks ago. Initially saw PCP who referred him to GI; patient had an outpatient appointment with GI at the end of October but because the bleeding would not stop he decided to come to the ED. Patient found to have Shiga toxin-producing E. Coli, and he has been on IV fluids for hydration and probiotics. Hemoglobin is stable. CT scan showed possible mass in the rectum. Patient underwent colonoscopy earlier today which showed a fungating, sessile and ulcerated partially obstructing large mass in the recto-sigmoid colon; the mass was partially circumferential (involving two-thirds of the lumen circumference); it was tattooed and biopsied. CEA WNL.   He currently denies any abdominal pain, nausea, vomiting. He has not had any BMs since colonoscopy. States that typically he has at least 1 BM daily.  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   HENT: Negative.   Eyes: Negative.   Respiratory: Negative.   Cardiovascular: Negative.   Gastrointestinal: Positive for blood in stool and diarrhea. Negative for abdominal pain, constipation, nausea and vomiting.  Genitourinary: Negative.   Musculoskeletal: Negative.   Skin: Negative.   Neurological: Negative.     Family History  Problem Relation Age of Onset  . Hypertension Mother   . Asthma Father   . Cancer Father        Prostate    Past Medical History:  Diagnosis Date  . Allergy    grass, dander  . GERD (gastroesophageal reflux disease)    . History of meningitis   . Hypertension     History reviewed. No pertinent surgical history.  Social History:  reports that he has never smoked. He has never used smokeless tobacco. He reports that he does not drink alcohol or use drugs.  Allergies:  Allergies  Allergen Reactions  . Chloroquine Itching    Medications Prior to Admission  Medication Sig Dispense Refill  . amLODipine (NORVASC) 5 MG tablet TAKE 1 TABLET DAILY 90 tablet 1  . calcium carbonate (OS-CAL - DOSED IN MG OF ELEMENTAL CALCIUM) 1250 (500 Ca) MG tablet Take 1 tablet by mouth daily as needed (for bones).    . cetirizine (ZYRTEC) 10 MG tablet Take 10 mg by mouth daily as needed for allergies.    . Multiple Vitamin (MULTIVITAMIN WITH MINERALS) TABS tablet Take 1 tablet by mouth daily.    . naproxen sodium (ANAPROX) 220 MG tablet Take 440-660 mg by mouth daily as needed (for headache).    Marland Kitchen omeprazole (PRILOSEC) 40 MG capsule Take 40 mg by mouth daily as needed (heart burn).     . [DISCONTINUED] Fexofenadine HCl (ALLEGRA PO) Take 1 tablet by mouth daily as needed (allergies).     . [DISCONTINUED] ibuprofen (ADVIL,MOTRIN) 600 MG tablet Take 1 tablet (600 mg total) by mouth every 6 (six) hours as needed for moderate pain. 30 tablet 0    Blood pressure 124/87, pulse 72, temperature 98.2 F (36.8 C), temperature source Oral, resp. rate 16, height '6\' 1"'  (1.854 m), weight 86.1 kg (189 lb 14.4 oz),  SpO2 100 %. Physical Exam: Physical Exam  Constitutional: He is oriented to person, place, and time. He appears well-developed and well-nourished. No distress.  HENT:  Head: Normocephalic and atraumatic.  Eyes: Pupils are equal, round, and reactive to light. EOM are normal. No scleral icterus.  Neck: Normal range of motion. Neck supple. No tracheal deviation present.  Cardiovascular: Normal rate, regular rhythm, normal heart sounds and intact distal pulses.  Exam reveals no gallop and no friction rub.   No murmur  heard. Pulmonary/Chest: Effort normal and breath sounds normal. No respiratory distress. He has no wheezes.  Abdominal: Soft. Bowel sounds are normal. He exhibits no distension and no mass. There is no tenderness. There is no rebound and no guarding. No hernia.  Musculoskeletal: Normal range of motion. He exhibits no edema, tenderness or deformity.  Neurological: He is alert and oriented to person, place, and time. No cranial nerve deficit or sensory deficit.  Skin: Skin is warm and dry. No rash noted. No erythema.  Psychiatric: He has a normal mood and affect. His behavior is normal. Judgment and thought content normal.    Results for orders placed or performed during the hospital encounter of 07/03/17 (from the past 48 hour(s))  Rapid HIV screen (HIV 1/2 Ab+Ag)     Status: None   Collection Time: 07/03/17  2:28 PM  Result Value Ref Range   HIV-1 P24 Antigen - HIV24 NON REACTIVE NON REACTIVE   HIV 1/2 Antibodies NON REACTIVE NON REACTIVE   Interpretation (HIV Ag Ab)      A non reactive test result means that HIV 1 or HIV 2 antibodies and HIV 1 p24 antigen were not detected in the specimen.  CEA     Status: None   Collection Time: 07/03/17  7:25 PM  Result Value Ref Range   CEA 2.7 0.0 - 4.7 ng/mL    Comment: (NOTE)       Roche ECLIA methodology       Nonsmokers  <3.9                                     Smokers     <5.6 Performed At: [facility] [place], [place] [id number] Lindon Romp MD [id number]   Sedimentation rate     Status: None   Collection Time: 07/03/17  7:25 PM  Result Value Ref Range   Sed Rate 3 0 - 16 mm/hr  CBC     Status: None   Collection Time: 07/04/17  6:14 AM  Result Value Ref Range   WBC 4.3 4.0 - 10.5 K/uL   RBC 4.75 4.22 - 5.81 MIL/uL   Hemoglobin 13.3 13.0 - 17.0 g/dL   HCT 39.6 39.0 - 52.0 %   MCV 83.4 78.0 - 100.0 fL   MCH 28.0 26.0 - 34.0 pg   MCHC 33.6 30.0 - 36.0 g/dL   RDW 13.2 11.5 - 15.5 %   Platelets 194 150 -  400 K/uL  Comprehensive metabolic panel     Status: Abnormal   Collection Time: 07/04/17  6:14 AM  Result Value Ref Range   Sodium 137 135 - 145 mmol/L   Potassium 3.6 3.5 - 5.1 mmol/L   Chloride 106 101 - 111 mmol/L   CO2 27 22 - 32 mmol/L   Glucose, Bld 110 (H) 65 - 99 mg/dL   BUN 5 (L) 6 - 20  mg/dL   Creatinine, Ser 0.76 0.61 - 1.24 mg/dL   Calcium 8.8 (L) 8.9 - 10.3 mg/dL   Total Protein 6.2 (L) 6.5 - 8.1 g/dL   Albumin 3.5 3.5 - 5.0 g/dL   AST 21 15 - 41 U/L   ALT 23 17 - 63 U/L   Alkaline Phosphatase 59 38 - 126 U/L   Total Bilirubin 0.7 0.3 - 1.2 mg/dL   GFR calc non Af Amer >60 >60 mL/min   GFR calc Af Amer >60 >60 mL/min    Comment: (NOTE) The eGFR has been calculated using the CKD EPI equation. This calculation has not been validated in all clinical situations. eGFR's persistently <60 mL/min signify possible Chronic Kidney Disease.    Anion gap 4 (L) 5 - 15  Basic metabolic panel     Status: Abnormal   Collection Time: 07/05/17  5:27 AM  Result Value Ref Range   Sodium 139 135 - 145 mmol/L   Potassium 3.5 3.5 - 5.1 mmol/L   Chloride 106 101 - 111 mmol/L   CO2 28 22 - 32 mmol/L   Glucose, Bld 104 (H) 65 - 99 mg/dL   BUN 5 (L) 6 - 20 mg/dL   Creatinine, Ser 0.83 0.61 - 1.24 mg/dL   Calcium 8.6 (L) 8.9 - 10.3 mg/dL   GFR calc non Af Amer >60 >60 mL/min   GFR calc Af Amer >60 >60 mL/min    Comment: (NOTE) The eGFR has been calculated using the CKD EPI equation. This calculation has not been validated in all clinical situations. eGFR's persistently <60 mL/min signify possible Chronic Kidney Disease.    Anion gap 5 5 - 15   Ct Abdomen Pelvis W Contrast  Result Date: 07/03/2017 CLINICAL DATA:  Abdominal pain.  Bloody diarrhea. EXAM: CT ABDOMEN AND PELVIS WITH CONTRAST TECHNIQUE: Multidetector CT imaging of the abdomen and pelvis was performed using the standard protocol following bolus administration of intravenous contrast. CONTRAST:  134m ISOVUE-300 IOPAMIDOL  (ISOVUE-300) INJECTION 61% COMPARISON:  None. FINDINGS: Lower chest: Lung bases clear.  Heart size within normal limits Hepatobiliary: Normal liver.  Gallbladder and bile ducts normal Pancreas: Negative Spleen: Negative Adrenals/Urinary Tract: Normal kidneys. No renal mass or obstruction or stone. Normal bladder Stomach/Bowel: Stomach and small bowel normal. Negative for bowel obstruction. Normal appendix. Nondilated colon. Possible soft tissue mass in the mid rectum which could represent neoplasm. Correlate with endoscopy. Vascular/Lymphatic: Negative Reproductive: Mild prostate enlargement. 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 Endoscopy recommended for further evaluation. Otherwise negative. Electronically Signed   By: CFranchot GalloM.D.   On: 07/03/2017 14:24   Anti-infectives    None        Assessment/Plan HTN GERD Hx of meningitis  GI bleed Partially obstructing mass in the recto-sigmoid colon - no prior h/o abdominal surgery - no FH colon cancer - CT scan shows possible mass in the rectum - Colonoscopy 9/13 showed a fungating, sessile and ulcerated partially obstructing large mass in the recto-sigmoid colon; the mass was partially circumferential (involving two-thirds of the lumen circumference); it was tattooed and biopsied. - CEA WNL  Shiga toxin-producing E. Coli - continue IVF, probiotics  FEN: IVF, clear liquids VTE: SCDs ID: [id number]   Plan: Patient has a partially obstructing colon mass, concern for malignancy. Discussed the recommendation for colectomy possible colostomy and patient would like to proceed as soon as possible. Due to Shiga toxin-producing  E. Coli infection, will discuss timing of surgery with MD. Keep on clear liquids and follow pathology.  Wellington Hampshire, [facility] 07/05/2017, 3:11 PM Pager:  [phone] Consults: [phone] Mon-Fri 7:00 am-4:30 pm Sat-Sun 7:00 am-11:30 am

## 2017-07-05 NOTE — Anesthesia Procedure Notes (Signed)
Procedure Name: MAC Date/Time: 07/05/2017 10:22 AM Performed by: Orlie Dakin Pre-anesthesia Checklist: Patient identified, Suction available, Patient being monitored and Timeout performed Patient Re-evaluated:Patient Re-evaluated prior to induction Oxygen Delivery Method: Nasal cannula Preoxygenation: Pre-oxygenation with 100% oxygen

## 2017-07-05 NOTE — Anesthesia Postprocedure Evaluation (Signed)
Anesthesia Post Note  Patient: Paul [name]  Procedure(s) Performed: Procedure(s) (LRB): COLONOSCOPY WITH PROPOFOL (Left)     Patient location during evaluation: PACU Anesthesia Type: MAC Level of consciousness: awake and alert Pain management: pain level controlled Vital Signs Assessment: post-procedure vital signs reviewed and stable Respiratory status: spontaneous breathing, nonlabored ventilation, respiratory function stable and patient connected to nasal cannula oxygen Cardiovascular status: stable and blood pressure returned to baseline Postop Assessment: no apparent nausea or vomiting Anesthetic complications: no    Last Vitals:  Vitals:   07/05/17 1105 07/05/17 1110  BP:  122/85  Pulse: 76 76  Resp: 15 15  Temp:    SpO2: 100% 100%    Last Pain:  Vitals:   07/05/17 1102  TempSrc: Oral  PainSc:                  [name]

## 2017-07-05 NOTE — Transfer of Care (Signed)
Immediate Anesthesia Transfer of Care Note  Patient: Paul [name]  Procedure(s) Performed: Procedure(s): COLONOSCOPY WITH PROPOFOL (Left)  Patient Location: Endoscopy Unit  Anesthesia Type:MAC  Level of Consciousness: drowsy  Airway & Oxygen Therapy: Patient Spontanous Breathing and Patient connected to nasal cannula oxygen  Post-op Assessment: Report given to RN and Post -op Vital signs reviewed and stable  Post vital signs: Reviewed and stable  Last Vitals:  Vitals:   07/05/17 0618 07/05/17 0938  BP: 107/72 (!) 144/94  Pulse: 62 70  Resp: 18 19  Temp: 36.7 C 36.8 C  SpO2: 100%     Last Pain:  Vitals:   07/05/17 0938  TempSrc: Oral  PainSc:          Complications: No apparent anesthesia complications

## 2017-07-05 NOTE — Op Note (Signed)
[facility] Patient Name: Paul [name] Procedure Date : 07/05/2017 MRN: [id number] Attending MD: Arta Silence , MD Date of Birth: [birth year] CSN: [id number] Age: 51 Admit Type: Inpatient Procedure:                Colonoscopy Indications:              Hematochezia, Abnormal CT of the GI tract Providers:                Arta Silence, MD, Cleda Daub, RN, Cherylynn Ridges, Technician Referring MD:              Medicines:                Monitored Anesthesia Care Complications:            No immediate complications. Estimated Blood Loss:     Estimated blood loss was minimal. Procedure:                Pre-Anesthesia Assessment:                           - Prior to the procedure, a History and Physical                            was performed, and patient medications and                            allergies were reviewed. The patient's tolerance of                            previous anesthesia was also reviewed. The risks                            and benefits of the procedure and the sedation                            options and risks were discussed with the patient.                            All questions were answered, and informed consent                            was obtained. Prior Anticoagulants: The patient has                            taken no previous anticoagulant or antiplatelet                            agents. ASA Grade Assessment: II - A patient with                            mild systemic disease. After reviewing the risks  and benefits, the patient was deemed in                            satisfactory condition to undergo the procedure.                           After obtaining informed consent, the colonoscope                            was passed under direct vision. Throughout the                            procedure, the patient's blood pressure, pulse, and                            oxygen  saturations were monitored continuously. The                            EC-[id number] ([id number]) scope was introduced through                            the anus and advanced to the the cecum, identified                            by appendiceal orifice and ileocecal valve. The                            ileocecal valve, appendiceal orifice, and rectum                            were photographed. The entire colon was examined.                            The colonoscopy was performed without difficulty.                            The patient tolerated the procedure well. The                            quality of the bowel preparation was good. Scope In: 10:30:03 AM Scope Out: 10:55:21 AM Scope Withdrawal Time: 0 hours 18 minutes 16 seconds  Total Procedure Duration: 0 hours 25 minutes 18 seconds  Findings:      The perianal and digital rectal examinations were normal.      Internal hemorrhoids were found during retroflexion. The hemorrhoids       were moderate.      No additional abnormalities were found on retroflexion.      A fungating, sessile and ulcerated partially obstructing large mass was       found in the recto-sigmoid colon. The mass was partially circumferential       (involving two-thirds of the lumen circumference). Oozing was present.       Area was successfully injected with 2 mL [place] ink for tattooing. This       was biopsied with a cold forceps for histology.  Colon otherwise normal; no other polyps, masses, vascular ectasias, or       inflammatory changes were seen. Impression:               - Internal hemorrhoids.                           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 Injected. Biopsied. Moderate Sedation:      None Recommendation:           - Return patient to hospital ward for ongoing care.                           - Clear liquid diet today.                           - Continue present  medications.                           - Await pathology results.                           - Repeat colonoscopy (date not yet determined) for                            surveillance based on pathology results.                           - Surgical consult today; will need staging                            work-up, and possibly either rectal u/s or pelvic                            MRI if no distant mets seen on staging work-up.                           - Would likely not pursue surgical intervention at                            this time, pending staging work-up, as patient is                            just recovering from E. coli (shiga-toxin)                            infection.                           - Supportive care, NO antibiotics, for Shiga toxin                            E. coli infection.                           -  [facility] will follow. Procedure Code(s):        --- Professional ---                           [phone], Colonoscopy, flexible; with directed                            submucosal injection(s), any substance                           40347, Colonoscopy, flexible; with biopsy, single                            or multiple Diagnosis Code(s):        --- Professional ---                           D49.0, Neoplasm of unspecified behavior of                            digestive system                           K64.8, Other hemorrhoids                           K92.1, Melena (includes Hematochezia)                           R93.3, Abnormal findings on diagnostic imaging of                            other parts of digestive tract CPT copyright 2016 American Medical Association. All rights reserved. The codes documented in this report are preliminary and upon coder review may  be revised to meet current compliance requirements. Arta Silence, MD 07/05/2017 11:04:42 AM This report has been signed electronically. Number of Addenda: 0

## 2017-07-05 NOTE — Interval H&P Note (Signed)
History and Physical Interval Note:  07/05/2017 10:13 AM  Paul [name]  has presented today for surgery, with the diagnosis of hematochezia, abnormal CT scan  The various methods of treatment have been discussed with the patient and family. After consideration of risks, benefits and other options for treatment, the patient has consented to  Procedure(s): COLONOSCOPY WITH PROPOFOL (Left) as a surgical intervention .  The patient's history has been reviewed, patient examined, no change in status, stable for surgery.  I have reviewed the patient's chart and labs.  Questions were answered to the patient's satisfaction.     Landry Dyke

## 2017-07-06 ENCOUNTER — Inpatient Hospital Stay (HOSPITAL_COMMUNITY): Payer: BLUE CROSS/BLUE SHIELD

## 2017-07-06 DIAGNOSIS — K219 Gastro-esophageal reflux disease without esophagitis: Secondary | ICD-10-CM

## 2017-07-06 MED ORDER — GADOBENATE DIMEGLUMINE 529 MG/ML IV SOLN
18.0000 mL | Freq: Once | INTRAVENOUS | Status: AC | PRN
  Administered 2017-07-06: 18 mL via INTRAVENOUS

## 2017-07-06 NOTE — Discharge Instructions (Signed)
Will refer to [facility]  Office should call you with appointment   Office number [phone]

## 2017-07-06 NOTE — Progress Notes (Signed)
1 Day Post-Op   Subjective/Chief Complaint: No complaints    Objective: Vital signs in last 24 hours: Temp:  [97.7 F (36.5 C)-98.4 F (36.9 C)] 98.4 F (36.9 C) (09/14 0653) Pulse Rate:  [70-84] 71 (09/14 0653) Resp:  [15-24] 18 (09/14 0653) BP: (94-144)/(56-94) 124/77 (09/14 0653) SpO2:  [99 %-100 %] 100 % (09/14 0653) Last BM Date: 07/05/17  Intake/Output from previous day: 09/13 0701 - 09/14 0700 In: 600 [I.V.:600] Out: -  Intake/Output this shift: No intake/output data recorded.  Soft NT abdomen  Lab Results:   Recent Labs  07/03/17 0945 07/04/17 0614  WBC 5.3 4.3  HGB 13.4 13.3  HCT 40.3 39.6  PLT 220 194   BMET  Recent Labs  07/04/17 0614 07/05/17 0527  NA 137 139  K 3.6 3.5  CL 106 106  CO2 27 28  GLUCOSE 110* 104*  BUN 5* 5*  CREATININE 0.76 0.83  CALCIUM 8.8* 8.6*   PT/INR No results for input(s): LABPROT, INR in the last 72 hours. ABG No results for input(s): PHART, HCO3 in the last 72 hours.  Invalid input(s): PCO2, PO2  Studies/Results: No results found.  Anti-infectives: Anti-infectives    None      Assessment/Plan: Patient Active Problem List   Diagnosis Date Noted  . Colonic mass 07/03/2017  . Essential hypertension 07/03/2017  . 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 Essential hypertension, benign 06/26/2014  . Elevated blood pressure 08/14/2013  . GERD (gastroesophageal reflux disease) 09/10/2012    Probable rectal cancer  Await MRI Await clearance of GI infection  Can go home on regular diet  Need follow up with  Colorectal surgeon Will set up   LOS: 2 days    Kylin Genna A. 07/06/2017

## 2017-07-06 NOTE — Plan of Care (Signed)
Patient off floor (getting MRI?).  Biopsies confirmed adenocarcinoma.  Surgical consultation appreciated.  I talked with ID re: [email or web address] and they stated one week should be enough time to wait prior to pursuing surgery or other interventions for his cancer.  Will sign-off; please call with questions; tentative plan for repeat colonoscopy in one year.

## 2017-07-07 NOTE — Discharge Summary (Signed)
Physician Discharge Summary  Ladarren Steiner JXB:[id number] DOB: 1966-05-08 DOA: 07/03/2017  PCP: Eulas Post, MD  Admit date: 07/03/2017 Discharge date: 07/06/2017  Admitted From: Home.  Disposition:  Home.   Recommendations for Outpatient Follow-up:  1. Follow up with PCP in 1-2 weeks 2. Please obtain BMP/CBC in one week Please follow up with surgery and gastroenterology as recommended.  Discharge Condition:stable.  CODE STATUS:full code.  Diet recommendation: Heart Healthy  Brief/Interim Summary: Aron Kanouteis a 51 y.o.malewith medical history significant of meningitis in 2007.   Comes in for approximate 4wkhistory of watery diarrhea and abdominal cramping. CT abd shows colon mass. Gi consulted and he underwent colonoscopy showing fungating sessile mass, suspicious for malignancy. surgey consulted recommended wait one week for the STEC to resolve and they will set up appt with colo rectal surgeon as outpatient to schedule surgery.    Discharge Diagnoses:  Active Problems:   GERD (gastroesophageal reflux disease)   Colonic mass   Essential hypertension   STEC (Shiga toxin-producing Escherichia coli)  Colonic mass:  S/p colonoscopy showing fungating mass, sessile, ulcerated mass, likely malignant.  Surgery consulted. And MRI  Of the pelvis ordered. 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  Discussed the results of both with the patient, recommended follow up with surgery and GI as recommended.     STEC:  One BM in the last 24 hours.  No bloody stools.  Appears to have resolved.    Hypertension:  Well controlled.   GERD : PPI.     Discharge Instructions  Discharge Instructions    Diet general    Complete by:  As directed    Discharge instructions    Complete by:  As directed    Follow up with surgery in one week as recommended.     Allergies as of 07/06/2017       Reactions   Chloroquine Itching      Medication List    STOP taking these medications   naproxen sodium 220 MG tablet Commonly known as:  ANAPROX     TAKE these medications   amLODipine 5 MG tablet Commonly known as:  NORVASC TAKE 1 TABLET DAILY   calcium carbonate 1250 (500 Ca) MG tablet Commonly known as:  OS-CAL - dosed in mg of elemental calcium Take 1 tablet by mouth daily as needed (for bones).   cetirizine 10 MG tablet Commonly known as:  ZYRTEC Take 10 mg by mouth daily as needed for allergies.   multivitamin with minerals Tabs tablet Take 1 tablet by mouth daily.   omeprazole 40 MG capsule Commonly known as:  PRILOSEC Take 40 mg by mouth daily as needed (heart burn).            Discharge Care Instructions        Start     Ordered   07/06/17 0000  Discharge instructions    Comments:  Follow up with surgery in one week as recommended.   07/06/17 1643   07/06/17 0000  Diet general     07/06/17 1643     Follow-up Information    Cornett, Marcello Moores, MD. Schedule an appointment as soon as possible for a visit in 1 week(s).   Specialty:  General Surgery Why:  for invasive adenocarcinoma of the recto sigmoid colon.  Contact information: [address] [place] [address] [phone]          Allergies  Allergen  Reactions  . Chloroquine Itching    Consultations: Surgery Gastroenterology.   Procedures/Studies: Mr Pelvis W Wo Contrast  Result Date: 07/06/2017 CLINICAL DATA:  Four-week history of watery diarrhea and abdominal cramping. Abdominal CT demonstrating rectosigmoid mass. Colonoscopy yesterday demonstrating malignant appearing mass at the rectosigmoid junction. EXAM: MRI PELVIS WITHOUT CONTRAST TECHNIQUE: Multiplanar multisequence MR imaging of the pelvis was performed. No intravenous contrast was administered. Small amount of [place] gel was administered per rectum to optimize tumor evaluation. COMPARISON:  CT 07/03/2017  FINDINGS: TUMOR LOCATION Location from Anal Verge: High-greater than 11 cm from the anal verge including on image 23/ series 4. At the rectosigmoid junction. Shortest Distance from Tumor to Anal Sphincter:  5-6 cm cm TUMOR DESCRIPTION Circumferential Extent:  Circumferential. Craniocaudal Extent:  4.5 cm T - CATEGORY Extension through Muscularis Propria: Yes -T3. Suspect extra colonic tumor extension most apparent about the 2 o'clock position, including on image 14/series 9 and image 15/series 13. Maximum extension beyond Muscularis Propria:  1-2 mm (early T3 = <45mm; advanced T3 = >83mm) Extramural vascular invasion/tumor thrombus:  No Invasion of Anterior Peritoneal Reflection:  No Involvement of Adjacent Organs or Pelvic Sidewall Structures:  No N - CATEGORY Mesorectal Lymph Nodes >=43mm: Yes. Nodes within the mesorectum and sigmoid meso colon measure maximally 9 mm on image 10/ series 13. Extra-mesorectal Lymphadenopathy:  No Other: No significant free fluid. IMPRESSION: Rectosigmoid adenocarcinoma T stage:  Early T3 Rectal adenocarcinoma N stage:   N1 Distance from tumor to the anal sphincter is on the order of 5-6 cm. Electronically Signed   By: [name] M.D.   On: 07/06/2017 15:12   Ct Abdomen Pelvis W Contrast  Result Date: 07/03/2017 CLINICAL DATA:  Abdominal pain.  Bloody diarrhea. EXAM: CT ABDOMEN AND PELVIS WITH CONTRAST TECHNIQUE: Multidetector CT imaging of the abdomen and pelvis was performed using the standard protocol following bolus administration of intravenous contrast. CONTRAST:  142mL ISOVUE-300 IOPAMIDOL (ISOVUE-300) INJECTION 61% COMPARISON:  None. FINDINGS: Lower chest: Lung bases clear.  Heart size within normal limits Hepatobiliary: Normal liver.  Gallbladder and bile ducts normal Pancreas: Negative Spleen: Negative Adrenals/Urinary Tract: Normal kidneys. No renal mass or obstruction or stone. Normal bladder Stomach/Bowel: Stomach and small bowel normal. Negative for bowel  obstruction. Normal appendix. Nondilated colon. Possible soft tissue mass in the mid rectum which could represent neoplasm. Correlate with endoscopy. Vascular/Lymphatic: Negative Reproductive: Mild prostate enlargement. 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 Endoscopy recommended for further evaluation. Otherwise negative. Electronically Signed   By: Franchot Gallo M.D.   On: 07/03/2017 14:24    MRI of the  Pelvis.    Subjective: NO ABD PAIN, no complaints.   Discharge Exam: Vitals:   07/06/17 0653 07/06/17 1145  BP: 124/77 (!) 123/91  Pulse: 71 72  Resp: 18   Temp: 98.4 F (36.9 C)   SpO2: 100%    Vitals:   07/05/17 1252 07/05/17 2111 07/06/17 0653 07/06/17 1145  BP: 124/87 136/85 124/77 (!) 123/91  Pulse: 72 80 71 72  Resp: 16 16 18    Temp: 98.2 F (36.8 C) 98.4 F (36.9 C) 98.4 F (36.9 C)   TempSrc: Oral     SpO2: 100% 99% 100%   Weight:      Height:        General: Pt is alert, awake, not in acute distress Cardiovascular: RRR, S1/S2 +, no rubs, no gallops Respiratory: CTA bilaterally,  no wheezing, no rhonchi Abdominal: Soft, NT, ND, bowel sounds + Extremities: no edema, no cyanosis    The results of significant diagnostics from this hospitalization (including imaging, microbiology, ancillary and laboratory) are listed below for reference.     Microbiology: Recent Results (from the past 240 hour(s))  Gastrointestinal Panel by PCR , Stool     Status: Abnormal   Collection Time: 07/03/17 12:25 PM  Result Value Ref Range Status   Campylobacter species NOT DETECTED NOT DETECTED Final   Plesimonas shigelloides NOT DETECTED NOT DETECTED Final   Salmonella species NOT DETECTED NOT DETECTED Final   Yersinia enterocolitica NOT DETECTED NOT DETECTED Final   Vibrio species NOT DETECTED NOT DETECTED Final   Vibrio cholerae NOT DETECTED NOT  DETECTED Final   Enteroaggregative E coli (EAEC) NOT DETECTED NOT DETECTED Final   Enterotoxigenic E coli (ETEC) NOT DETECTED NOT DETECTED Final   Shiga like toxin producing E coli (STEC) DETECTED (A) NOT DETECTED Final    Comment: RESULT CALLED TO, READ BACK BY AND VERIFIED WITH: CRICKET BENNING 07/03/17 1638 KLW    E. coli O157 NOT DETECTED NOT DETECTED Final   Shigella/Enteroinvasive E coli (EIEC) NOT DETECTED NOT DETECTED Final   Cryptosporidium NOT DETECTED NOT DETECTED Final   Cyclospora cayetanensis NOT DETECTED NOT DETECTED Final   Entamoeba histolytica NOT DETECTED NOT DETECTED Final   Giardia lamblia NOT DETECTED NOT DETECTED Final   Adenovirus F40/41 NOT DETECTED NOT DETECTED Final   Astrovirus NOT DETECTED NOT DETECTED Final   Norovirus GI/GII NOT DETECTED NOT DETECTED Final   Rotavirus A NOT DETECTED NOT DETECTED Final   Sapovirus (I, II, IV, and V) NOT DETECTED NOT DETECTED Final     Labs: BNP (last 3 results) No results for input(s): BNP in the last 8760 hours. Basic Metabolic Panel:  Recent Labs Lab 07/03/17 0945 07/04/17 0614 07/05/17 0527  NA 138 137 139  K 4.3 3.6 3.5  CL 104 106 106  CO2 26 27 28   GLUCOSE 127* 110* 104*  BUN 7 5* 5*  CREATININE 0.90 0.76 0.83  CALCIUM 9.3 8.8* 8.6*   Liver Function Tests:  Recent Labs Lab 07/03/17 0945 07/04/17 0614  AST 29 21  ALT 26 23  ALKPHOS 67 59  BILITOT 0.5 0.7  PROT 6.9 6.2*  ALBUMIN 3.9 3.5   No results for input(s): LIPASE, AMYLASE in the last 168 hours. No results for input(s): AMMONIA in the last 168 hours. CBC:  Recent Labs Lab 07/03/17 0945 07/04/17 0614  WBC 5.3 4.3  HGB 13.4 13.3  HCT 40.3 39.6  MCV 84.0 83.4  PLT 220 194   Cardiac Enzymes: No results for input(s): CKTOTAL, CKMB, CKMBINDEX, TROPONINI in the last 168 hours. BNP: Invalid input(s): POCBNP CBG: No results for input(s): GLUCAP in the last 168 hours. D-Dimer No results for input(s): DDIMER in the last 72  hours. Hgb A1c No results for input(s): HGBA1C in the last 72 hours. Lipid Profile No results for input(s): CHOL, HDL, LDLCALC, TRIG, CHOLHDL, LDLDIRECT in the last 72 hours. Thyroid function studies No results for input(s): TSH, T4TOTAL, T3FREE, THYROIDAB in the last 72 hours.  Invalid input(s): FREET3 Anemia work up No results for input(s): VITAMINB12, FOLATE, FERRITIN, TIBC, IRON, RETICCTPCT in the last 72 hours. Urinalysis No results found for: COLORURINE, APPEARANCEUR, LABSPEC, [place], GLUCOSEU, [place], [place], KETONESUR, PROTEINUR, UROBILINOGEN, NITRITE, LEUKOCYTESUR Sepsis Labs Invalid input(s): PROCALCITONIN,  WBC,  LACTICIDVEN Microbiology Recent Results (from the past 240 hour(s))  Gastrointestinal Panel by  PCR , Stool     Status: Abnormal   Collection Time: 07/03/17 12:25 PM  Result Value Ref Range Status   Campylobacter species NOT DETECTED NOT DETECTED Final   Plesimonas shigelloides NOT DETECTED NOT DETECTED Final   Salmonella species NOT DETECTED NOT DETECTED Final   Yersinia enterocolitica NOT DETECTED NOT DETECTED Final   Vibrio species NOT DETECTED NOT DETECTED Final   Vibrio cholerae NOT DETECTED NOT DETECTED Final   Enteroaggregative E coli (EAEC) NOT DETECTED NOT DETECTED Final   Enterotoxigenic E coli (ETEC) NOT DETECTED NOT DETECTED Final   Shiga like toxin producing E coli (STEC) DETECTED (A) NOT DETECTED Final    Comment: RESULT CALLED TO, READ BACK BY AND VERIFIED WITH: CRICKET BENNING 07/03/17 1638 KLW    E. coli O157 NOT DETECTED NOT DETECTED Final   Shigella/Enteroinvasive E coli (EIEC) NOT DETECTED NOT DETECTED Final   Cryptosporidium NOT DETECTED NOT DETECTED Final   Cyclospora cayetanensis NOT DETECTED NOT DETECTED Final   Entamoeba histolytica NOT DETECTED NOT DETECTED Final   Giardia lamblia NOT DETECTED NOT DETECTED Final   Adenovirus F40/41 NOT DETECTED NOT DETECTED Final   Astrovirus NOT DETECTED NOT DETECTED Final   Norovirus GI/GII  NOT DETECTED NOT DETECTED Final   Rotavirus A NOT DETECTED NOT DETECTED Final   Sapovirus (I, II, IV, and V) NOT DETECTED NOT DETECTED Final     Time coordinating discharge: Over 30 minutes  SIGNED:   Hosie Poisson, MD  [facility] 07/07/2017, 10:46 AM Pager   If 7PM-7AM, please contact night-coverage [email or web address] Password [email or web address]

## 2017-07-09 ENCOUNTER — Encounter: Payer: Self-pay | Admitting: Surgery

## 2017-07-09 ENCOUNTER — Telehealth: Payer: Self-pay

## 2017-07-09 NOTE — Telephone Encounter (Signed)
Admit date: 07/03/2017 Discharge date: 07/06/2017 Admitted From: Home.  Disposition:  Home.  Recommendations for Outpatient Follow-up:  1. Follow up with PCP in 1-2 weeks 2. Please obtain BMP/CBC in one week Please follow up with surgery and gastroenterology as recommended.  Discharge Condition:stable.  CODE STATUS:full code.  Diet recommendation: Heart Healthy  Spoke with pt and he states that he is doing well. He denies any GI bleeding or abdominal pain. He has no questions or concerns at this time. He did see Dr. Johney Maine today for surgical consult.  Appt with Dr. Elease Hashimoto 07/11/17, pt aware.   Transition Care Management Follow-up Telephone Call   Date discharged? 07/06/17   How have you been since you were released from the hospital? good   Do you understand why you were in the hospital? yes   Do you understand the discharge instructions? yes   Where were you discharged to? home   Items Reviewed:  Medications reviewed: yes  Allergies reviewed: yes  Dietary changes reviewed: yes  Referrals reviewed: yes, pt saw Dr. Johney Maine for surgical consult today   Functional Questionnaire:   Activities of Daily Living (ADLs):   He states they are independent in the following: ambulation, bathing and hygiene, feeding, continence, grooming, toileting and dressing States they require assistance with the following: none   Any transportation issues/concerns?: no   Any patient concerns? no   Confirmed importance and date/time of follow-up visits scheduled yes  Provider Appointment booked with  Confirmed with patient if condition begins to worsen call PCP or go to the ER.  Patient was given the office number and encouraged to call back with question or concerns.  : yes

## 2017-07-09 NOTE — Telephone Encounter (Signed)
Attempted to reach patient to complete TCM follow-up call. Unable to reach patient. Message left on voicemail asking patient to return call to office.   

## 2017-07-10 ENCOUNTER — Telehealth: Payer: Self-pay | Admitting: Hematology

## 2017-07-10 ENCOUNTER — Ambulatory Visit (HOSPITAL_BASED_OUTPATIENT_CLINIC_OR_DEPARTMENT_OTHER): Payer: BLUE CROSS/BLUE SHIELD | Admitting: Hematology

## 2017-07-10 ENCOUNTER — Encounter: Payer: Self-pay | Admitting: Hematology

## 2017-07-10 ENCOUNTER — Encounter: Payer: Self-pay | Admitting: Nurse Practitioner

## 2017-07-10 DIAGNOSIS — C20 Malignant neoplasm of rectum: Secondary | ICD-10-CM | POA: Diagnosis not present

## 2017-07-10 DIAGNOSIS — M545 Low back pain: Secondary | ICD-10-CM | POA: Diagnosis not present

## 2017-07-10 DIAGNOSIS — I10 Essential (primary) hypertension: Secondary | ICD-10-CM

## 2017-07-10 HISTORY — DX: Malignant neoplasm of rectum: C20

## 2017-07-10 NOTE — Telephone Encounter (Signed)
Gave avs and calendar for October and November  °

## 2017-07-10 NOTE — Progress Notes (Addendum)
[place]  Telephone:(336) [phone] Fax:([phone]) [place] Note   Patient Care Team: Eulas Post, MD as PCP - General (Family Medicine) Arta Silence, MD as Consulting Physician (Gastroenterology) Michael Boston, MD as Consulting Physician (General Surgery) Kyung Rudd, MD as Consulting Physician (Radiation Oncology) Ladell Pier, MD as Consulting Physician (Oncology) 07/10/2017  REFERRAL PHYSICIAN: Dr. Johney Maine   CHIEF COMPLAINTS/PURPOSE OF CONSULTATION:  Rectal cancer  Oncology History   Cancer Staging Rectal adenocarcinoma [facility]) Staging form: Colon and Rectum, AJCC 8th Edition - Clinical stage from 07/06/2017: Stage IIIB (cT3, cN1, cM0) - Signed by [name], NP on 07/10/2017      Rectal adenocarcinoma ([place])   07/03/2017 Imaging    CT ABD PELVIS W CONTRAST IMPRESSION: Possible mass in the rectum compatible carcinoma. Endoscopy recommended for further evaluation. Otherwise negative.      07/03/2017 Tumor Marker    CEA 2.7      07/05/2017 Initial Biopsy    Rectum biopsy -invasive adenocarcinoma, poorly differentiated      07/05/2017 Procedure    Colonoscopy Per Dr. Paulita Fujita Findings: The perianal and digital rectal examinations were normal.  Internal hemorrhoids were found during retroflexion. The hemorrhoids were moderate. No additional abnormalities were found on retroflexion.  A fungating, sessile and ulcerated partially obstructing large mass was found in the recto-sigmoid colon.The mass was partially circumferential (involving two-thirds of the lumen circumference). Oozing was present. Area was successfully injected with 2 mL [place] ink for tattooing. This was biopsied with a cold forceps for histology.      07/05/2017 Initial Diagnosis    Rectal adenocarcinoma ([place])      07/06/2017 Imaging    Staging MRI ABD/PELVIS revealed clinical stage T3N1M0, IIIb       HISTORY OF PRESENTING ILLNESS:  Paul [name] 51  y.o. male is here because of newly diagnosed rectal cancer. He has a long standing history of loose stool and developed diarrhea within the last 4 weeks. He noticed 3 episodes in 1 day of blood on toilet paper which prompted him to go to the ER on 07/03/2017. A CT ABD and pelvis revealed a mass in the rectum compatible with carcinoma. Incidentally, he tested positive for shiga like toxin producing E coli while in the ED. He was discharged from ED with no new medication. Colonoscopy on 07/05/2017 per Dr. Paulita Fujita revealed a fungating, sessile and ulcerated partially obstructing large mass in the recto-sigmoid colon. The mass was partially circumferential involving two-thirds of the lumen circumference. Oozing was present. Biopsy on 07/05/17 confirmed invasive adenocarcinoma of the rectum. Staging MRI on 07/06/17 revealed extension through muscularis propria 1-88mm and nodes within the mesorectum and sigmoid meso colon measuring 71mm, indicating cT3N1Mo, stage IIIb. He was referred to GI surgery Dr. Johney Maine, seen 07/09/2017.  Today he presents to clinic with a friend, referred by Dr. Johney Maine. He feels well overall with good appetite, stable weight, and no fatigue. His diarrhea has resolved but reports 2-3 loose stools per day, associated with cramping. No nausea, vomiting, or abdominal pain.   MEDICAL HISTORY:  Past Medical History:  Diagnosis Date  . Allergy    grass, dander  . Colon cancer ([place])   . GERD (gastroesophageal reflux disease)   . History of meningitis   . Hypertension    SURGICAL HISTORY: Past Surgical History:  Procedure Laterality Date  . COLONOSCOPY WITH PROPOFOL Left 07/05/2017   Procedure: COLONOSCOPY WITH PROPOFOL;  Surgeon: Arta Silence, MD;  Location: [place];  Service: Endoscopy;  Laterality: Left;   SOCIAL HISTORY: Social History   Social History  . Marital status: Married    Spouse name: N/A  . Number of children: N/A  . Years of education: N/A   Occupational History  .  Not on file.   Social History Main Topics  . Smoking status: Never Smoker  . Smokeless tobacco: Never Used  . Alcohol use No  . Drug use: No  . Sexual activity: Not on file   Other Topics Concern  . Not on file   Social History Narrative   Originally from [place], [place]   Patient is married with 3 children, ages 54, 30, and 64. He lives in a home with his wife and kids. 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 He works 3-11:30 pm shift; he has morning/daytime availability for appointments and treatments. He prefers to work during anticipated treatment but can apply for [employer]. He will send appropriate paperwork when the time comes.   FAMILY HISTORY: Family History  Problem Relation Age of Onset  . Hypertension Mother   . Asthma Father   . Cancer Father        Prostate  . Asthma Son   . Asthma Son   . Asthma Daughter    ALLERGIES:  is allergic to chloroquine.  MEDICATIONS:  Current Outpatient Prescriptions  Medication Sig Dispense Refill  . amLODipine (NORVASC) 5 MG tablet TAKE 1 TABLET DAILY 90 tablet 1  . calcium carbonate (OS-CAL - DOSED IN MG OF ELEMENTAL CALCIUM) 1250 (500 Ca) MG tablet Take 1 tablet by mouth daily as needed (for bones).    . cetirizine (ZYRTEC) 10 MG tablet Take 10 mg by mouth daily as needed for allergies.    . Multiple Vitamin (MULTIVITAMIN WITH MINERALS) TABS tablet Take 1 tablet by mouth daily.    Marland Kitchen omeprazole (PRILOSEC) 40 MG capsule Take 40 mg by mouth daily as needed (heart burn).      No current facility-administered medications for this visit.    REVIEW OF SYSTEMS:   Constitutional: Denies fatigue, fevers, chills, abnormal night sweats, or weight loss. Eyes: Denies blurriness of vision, double vision or watery eyes Ears, nose, mouth, throat, and face: Denies mucositis or sore throat Respiratory: Denies cough, dyspnea or wheezes Cardiovascular: Denies  palpitation, chest discomfort or lower extremity swelling Gastrointestinal:  Denies nausea, vomiting, constipation (+) heartburn, on prilosec (+) loose stool (+) recent rectal bleeding Skin: Denies abnormal skin rashes Lymphatics: Denies new lymphadenopathy or easy bruising Neurological:Denies numbness, tingling or new weaknesses Behavioral/Psych: Mood is stable, no new changes  All other systems were reviewed with the patient and are negative.  PHYSICAL EXAMINATION: ECOG PERFORMANCE STATUS: 0  Vitals:   07/10/17 1239  BP: (!) 152/93  Pulse: 70  Resp: 18  Temp: 98.6 F (37 C)  SpO2: 100%   Filed Weights   07/10/17 1239  Weight: 190 lb 11.2 oz (86.5 kg)   GENERAL:alert, no distress and comfortable SKIN: skin color, texture, turgor are normal, no rashes or significant lesions EYES: normal, conjunctiva are pink and non-injected, sclera clear OROPHARYNX:no exudate, no erythema and lips, buccal mucosa, and tongue normal  NECK: supple, thyroid normal size, non-tender, without nodularity LYMPH:  no palpable cervical or supraclavicular lymphadenopathy  LUNGS: clear to auscultation bilaterally with normal breathing effort HEART: regular rate & rhythm, S1 and S2 present; no murmurs and no lower extremity edema ABDOMEN:abdomen  soft, flat, non-tender and normal bowel sounds. No palpable hepatosplenomegaly or masses. Musculoskeletal:no cyanosis of digits and no clubbing  PSYCH: alert & oriented x 3 with fluent speech NEURO: no focal motor/sensory deficits RECTAL: exam deferred   LABORATORY DATA:  I have reviewed the data as listed CBC Latest Ref Rng & Units 07/04/2017 07/03/2017 06/27/2017  WBC 4.0 - 10.5 K/uL 4.3 5.3 4.1  Hemoglobin 13.0 - 17.0 g/dL 13.3 13.4 13.4  Hematocrit 39.0 - 52.0 % 39.6 40.3 41.0  Platelets 150 - 400 K/uL 194 220 214.0   CMP Latest Ref Rng & Units 07/05/2017 07/04/2017 07/03/2017  Glucose 65 - 99 mg/dL 104(H) 110(H) 127(H)  BUN 6 - 20 mg/dL 5(L) 5(L) 7    Creatinine 0.61 - 1.24 mg/dL 0.83 0.76 0.90  Sodium 135 - 145 mmol/L 139 137 138  Potassium 3.5 - 5.1 mmol/L 3.5 3.6 4.3  Chloride 101 - 111 mmol/L 106 106 104  CO2 22 - 32 mmol/L 28 27 26   Calcium 8.9 - 10.3 mg/dL 8.6(L) 8.8(L) 9.3  Total Protein 6.5 - 8.1 g/dL - 6.2(L) 6.9  Total Bilirubin 0.3 - 1.2 mg/dL - 0.7 0.5  Alkaline Phos 38 - 126 U/L - 59 67  AST 15 - 41 U/L - 21 29  ALT 17 - 63 U/L - 23 26   RADIOGRAPHIC STUDIES: I have personally reviewed the radiological images as listed and agreed with the findings in the report. Mr Pelvis W Wo Contrast  Result Date: 07/06/2017 CLINICAL DATA:  Four-week history of watery diarrhea and abdominal cramping. Abdominal CT demonstrating rectosigmoid mass. Colonoscopy yesterday demonstrating malignant appearing mass at the rectosigmoid junction. EXAM: MRI PELVIS WITHOUT CONTRAST TECHNIQUE: Multiplanar multisequence MR imaging of the pelvis was performed. No intravenous contrast was administered. Small amount of [place] gel was administered per rectum to optimize tumor evaluation. COMPARISON:  CT 07/03/2017 FINDINGS: TUMOR LOCATION Location from Anal Verge: High-greater than 11 cm from the anal verge including on image 23/ series 4. At the rectosigmoid junction. Shortest Distance from Tumor to Anal Sphincter:  5-6 cm cm TUMOR DESCRIPTION Circumferential Extent:  Circumferential. Craniocaudal Extent:  4.5 cm T - CATEGORY Extension through Muscularis Propria: Yes -T3. Suspect extra colonic tumor extension most apparent about the 2 o'clock position, including on image 14/series 9 and image 15/series 13. Maximum extension beyond Muscularis Propria:  1-2 mm (early T3 = <27mm; advanced T3 = >50mm) Extramural vascular invasion/tumor thrombus:  No Invasion of Anterior Peritoneal Reflection:  No Involvement of Adjacent Organs or Pelvic Sidewall Structures:  No N - CATEGORY Mesorectal Lymph Nodes >=85mm: Yes. Nodes within the mesorectum and sigmoid meso colon measure maximally  9 mm on image 10/ series 13. Extra-mesorectal Lymphadenopathy:  No Other: No significant free fluid. IMPRESSION: Rectosigmoid adenocarcinoma T stage:  Early T3 Rectal adenocarcinoma N stage:   N1 Distance from tumor to the anal sphincter is on the order of 5-6 cm. Electronically Signed   By: [name] M.D.   On: 07/06/2017 15:12   Ct Abdomen Pelvis W Contrast  Result Date: 07/03/2017 CLINICAL DATA:  Abdominal pain.  Bloody diarrhea. EXAM: CT ABDOMEN AND PELVIS WITH CONTRAST TECHNIQUE: Multidetector CT imaging of the abdomen and pelvis was performed using the standard protocol following bolus administration of intravenous contrast. CONTRAST:  14mL ISOVUE-300 IOPAMIDOL (ISOVUE-300) INJECTION 61% COMPARISON:  None. FINDINGS: Lower chest: Lung bases clear.  Heart size within normal limits Hepatobiliary: Normal liver.  Gallbladder and bile ducts normal Pancreas: Negative Spleen: Negative Adrenals/Urinary Tract: Normal  kidneys. No renal mass or obstruction or stone. Normal bladder Stomach/Bowel: Stomach and small bowel normal. Negative for bowel obstruction. Normal appendix. Nondilated colon. Possible soft tissue mass in the mid rectum which could represent neoplasm. Correlate with endoscopy. Vascular/Lymphatic: Negative Reproductive: Mild prostate enlargement. 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 Endoscopy recommended for further evaluation. Otherwise negative. Electronically Signed   By: Franchot Gallo M.D.   On: 07/03/2017 14:24   COLONOSCOPY: Dr. Paulita Fujita 07/05/2017 - Internal hemorrhoids. - Likely malignant partially obstructing tumor in the recto-sigmoid colon. Injected. Biopsied.  ASSESSMENT & PLAN:  Paul [name] is a 51 y.o.  with history of allergies, HTN, GERD, and chronic low back pain. He presents with rectal cancer diagnosed 07/05/2017.  1. Poorly differentiated  invasive rectal adenocarcinoma, cT3N1M0, stage IIIb -I reviewed his biopsy and CT, MRI scan results with the patient and his friend in detail.  -His tumor is located in the rectosigmoid colon. -We discussed he has likely locally advanced stage IIIb disease based on tumor size, invasion into the muscularis, and positive lymph node, no distant metastasis on day CT and MRI of abdomen and pelvis. -To complete his staging work up, I will order a CT chest to evaluate for metastatic disease in the chest.  -He has seen Dr. Johney Maine on 07/09/17 who recommends neoadjuvant chemoradiation to help shrink tumor followed by surgical low anterior resection with possible adjuvant chemotherapy after surgery. We are in agreement with this plan. -I discussed chemotherapy with radiosensitizing Xeloda twice daily to be taken concurrently with radiation Monday - Friday, no chemo or radiation on weekends for approximately 6 weeks  -We also discussed alternative intravenous chemotherapy 5-FU with concurrent radiation. Due to the convenience, if he does not have high co-pay, I'll prefer use Xeloda. He agrees -I reviewed potential side effects of chemoradiation including but not limited to fatigue, skin toxicity, mucositis, diarrhea, rectal pain and bleeding, bladder irritation or hematuria, nausea, vomiting, dehydration, treatment interruption, decreased blood counts, and risk of infection. He agrees to proceed. Chemotherapy consent was obtained today. -The goal of therapy is curative -He will attend a chemo class -We discussed the risk of recurrence with surgery alone is 50-60% for his stage and discussed recurrence rate after neoadjuvant chemoradiation, definitive surgery, and adjuvant chemotherapy is 20-30%; I recommend surveillance for 5 years after treatment. -I recommend for him to see our radiation oncologist for consult; I placed referral for radonc today to see Dr. Lisbeth Renshaw this week  -Encouraged to eat healthy diet and  exercise regularly -CEA 2.7 in normal range -We will see him weekly while on therapy, possibly to begin chemoradiation on 07/23/17, depends on his radiation plan   2. Genetics -Due to his age, he is a candidate for genetics referral to assess whether genetic mutation contributed to his cancer, he agrees  -Referral to genetics placed today  PLAN: -CT chest wo contrast within 1 week -Referral to Dr. Lisbeth Renshaw in rad onc -Referral to genetics -Chemo class -lab and f/u 10/1  Orders Placed This Encounter  Procedures  . CT Chest Wo Contrast    Standing Status:   Future    Standing Expiration Date:   07/10/2018    Order Specific Question:   Preferred imaging location?    Answer:   [place] [facility]    Order Specific Question:   Radiology Contrast Protocol - do NOT remove file path    Answer:   \\  [email or web address]  . Ambulatory referral to Radiation Oncology    Referral Priority:   Routine    Referral Type:   Consultation    Referral Reason:   Specialty Services Required    Requested Specialty:   Radiation Oncology    Number of Visits Requested:   1   All questions were answered. The patient knows to call the clinic with any problems, questions or concerns. I spent 55 counseling the patient face to face. The total time spent in the appointment was 60 and more than 50% was on counseling.  [name], NP 07/10/2017 3:33 PM  I have seen the patient, examined him. I agree with the assessment and and plan and have edited the notes.   Truitt Merle  07/10/2017

## 2017-07-10 NOTE — Progress Notes (Signed)
Introduced myself to patient during visit with Dr. Burr Medico and Cira Rue NP. I met individually with patient in lobby after his appointment with Dr. Burr Medico. Patient has concerns about finances and shared that he has a $6,000 deductable and is hoping that he can have his surgery before the end of the year to avoid another $6,000 co-pay next year. I assured him that I would let the doctors know and that he would be meeting with a financial counselor at [facility].  I encouraged [name] that I would be available for questions or concerns and would assist in getting his treatments coordinated.

## 2017-07-11 ENCOUNTER — Telehealth: Payer: Self-pay | Admitting: Pharmacist

## 2017-07-11 ENCOUNTER — Ambulatory Visit (INDEPENDENT_AMBULATORY_CARE_PROVIDER_SITE_OTHER): Payer: BLUE CROSS/BLUE SHIELD | Admitting: Family Medicine

## 2017-07-11 ENCOUNTER — Encounter: Payer: Self-pay | Admitting: *Deleted

## 2017-07-11 ENCOUNTER — Other Ambulatory Visit: Payer: Self-pay | Admitting: Surgery

## 2017-07-11 ENCOUNTER — Encounter: Payer: Self-pay | Admitting: Radiation Oncology

## 2017-07-11 ENCOUNTER — Encounter: Payer: Self-pay | Admitting: Family Medicine

## 2017-07-11 VITALS — BP 130/80 | HR 88 | Temp 98.4°F | Wt 194.0 lb

## 2017-07-11 DIAGNOSIS — C20 Malignant neoplasm of rectum: Secondary | ICD-10-CM

## 2017-07-11 DIAGNOSIS — I10 Essential (primary) hypertension: Secondary | ICD-10-CM

## 2017-07-11 MED ORDER — CAPECITABINE 500 MG PO TABS: ORAL_TABLET | ORAL | 0 refills | Status: DC

## 2017-07-11 MED ORDER — CAPECITABINE 500 MG PO TABS: ORAL_TABLET | ORAL | 1 refills | Status: DC

## 2017-07-11 NOTE — Addendum Note (Signed)
Addended by: Truitt Merle on: 07/11/2017 09:03 AM   Modules accepted: Orders

## 2017-07-11 NOTE — Progress Notes (Addendum)
GI Location of Tumor / Histology: Rectal cancer ( poorly differentiated  Invasive rectal adenocarcinoma, cT3N1MO,stage IIIb )  Paul [name] presented   with symptoms of: 4 week history  Diarrhea and abdominal cramping with  and bleeding   Biopsies of  (if applicable) revealed:Diagnosis 07/05/17: Dr. Arta Silence, MD Rectum, biopsy - INVASIVE ADENOCARCINOMA.  Past/Anticipated interventions by surgeon, if any: 07/05/17: Procedure(s) Performed: Procedure(s): COLONOSCOPY WITH PROPOFOL (Left) bx   Past/Anticipated interventions by medical oncology, if any: Dr. Burr Medico, MD sen 07/10/17, ordered ct chest, within 1 week,  ,genetic referral ,chemo class today  At 1000am, , lab and follow up 07/23/17  Weight changes, if any: 2 lbs   Bowel/Bladder complaints, if any:3-4 loose stools daily, som blood at times in toilet, a lot of gas   Nausea / Vomiting, if any  Pain issues, if any: none at present  Any blood per rectum:   SAFETY ISSUES: No  Prior radiation? NO  Pacemaker/ICD? NO)  Is the patient on methotrexate? NO  Current Complaints/Details:Married,  3 children,  Hx meningitis,HTN, Father prostate cancer, Asthma, Mother HTN,  Allergies: Chloroquine -itches BP 138/86   Pulse 87   Temp 98.6 F (37 C) (Oral)   Resp 20   Ht 6\' 1"  (1.854 m)   Wt 189 lb 9.6 oz (86 kg)   SpO2 100%   BMI 25.01 kg/m   Wt Readings from Last 3 Encounters:  07/12/17 189 lb 9.6 oz (86 kg)  07/11/17 194 lb (88 kg)  07/10/17 190 lb 11.2 oz (86.5 kg)

## 2017-07-11 NOTE — Telephone Encounter (Signed)
Oral Oncology Pharmacist Encounter  Received new prescription for Xeloda for the neoadjuvant treatment of stage IIIb rectal cancer in conjunction with radiation, planned duration approximately 6 weeks.  Recent labs in Epic assessed, OK for treatment.  Current medication list in Epic reviewed, no significant DDIs with Xeloda identified.  Prescription has been e-scribed to the [facility] for benefits analysis and approval. Patient scheduled for chemotherapy education class tomorrow (07/12/17) at 10am. Will update patient at that time about prescription status. Noted possible planned start date of 07/23/17 depending on radiation schedule.  Oral Oncology Clinic will continue to follow for insurance authorization, copayment issues, initial counseling and start date.  Johny Drilling, PharmD, BCPS, BCOP 07/11/2017 4:59 PM Oral Oncology Clinic [phone]

## 2017-07-11 NOTE — Progress Notes (Signed)
Subjective:     Patient ID: Paul [name], male   DOB: 12-12-1965, 51 y.o.   MRN: [id number]  HPI Patient seen for hospital follow-up. He was here recently with hematochezia with no obvious source on exam and patient was referred to GI for colonoscopy. He subsequently went to the hospital on 9/11 with reports of watery diarrhea and abdominal cramping though he did not mention any diarrhea when we had seen him. CT abdomen showed colon mass and he underwent colonoscopy which showed fungating sessile mass significant for poorly differentiated invasive adenocarcinoma. Hemoglobin remained stable during hospitalization.  Patient has seen general surgery as well as oncology. He is scheduled for chemotherapy and also radiation therapy and for genetic testing. Rectosigmoid adenocarcinoma T3 N1 stage. He had Shigella toxin producing Escherichia coli on lab work. He states that is diarrhea has essentially resolved at this time. No abdominal pain.  He's been out of work from 9/7 through 9/23 secondary to multiple appointments and has targeted return to date workup 9/24.  Denies any family history of colon cancer. He has hypertension treated with amlodipine.  Past Medical History:  Diagnosis Date  . Allergy    grass, dander  . Colon cancer ([place])   . GERD (gastroesophageal reflux disease)   . History of meningitis   . Hypertension    Past Surgical History:  Procedure Laterality Date  . COLONOSCOPY WITH PROPOFOL Left 07/05/2017   Procedure: COLONOSCOPY WITH PROPOFOL;  Surgeon: Arta Silence, MD;  Location: [place];  Service: Endoscopy;  Laterality: Left;    reports that he has never smoked. He has never used smokeless tobacco. He reports that he does not drink alcohol or use drugs. family history includes Asthma in his daughter, father, son, and son; Cancer in his father; Hypertension in his mother. Allergies  Allergen Reactions  . Chloroquine Itching     Review of Systems  Constitutional:  Negative for appetite change, chills, fever and unexpected weight change.  Respiratory: Negative for shortness of breath.   Cardiovascular: Negative for chest pain.  Gastrointestinal: Negative for abdominal pain, diarrhea, nausea and vomiting.  Genitourinary: Negative for dysuria.  Neurological: Negative for weakness.       Objective:   Physical Exam  Constitutional: He appears well-developed and well-nourished.  Cardiovascular: Normal rate and regular rhythm.   Pulmonary/Chest: Effort normal and breath sounds normal. No respiratory distress. He has no wheezes. He has no rales.       Assessment:     #1 recently diagnosed poorly differentiated adenocarcinoma rectosigmoid colon T3 N1 stage  #2 hypertension which has been generally well-controlled    Plan:     -Flu vaccine already given -We elected not to get any labs today since he is already scheduled for follow-up labs through oncology -Continue close follow-up with surgery and oncology -He is in process of getting paper work sent here regarding his short-term disability  Eulas Post MD [facility]

## 2017-07-12 ENCOUNTER — Ambulatory Visit
Admission: RE | Admit: 2017-07-12 | Discharge: 2017-07-12 | Disposition: A | Discharge status: Home / Self Care | Payer: BLUE CROSS/BLUE SHIELD | Source: Ambulatory Visit | Attending: Radiation Oncology | Admitting: Radiation Oncology

## 2017-07-12 ENCOUNTER — Ambulatory Visit: Payer: BLUE CROSS/BLUE SHIELD | Admitting: Hematology

## 2017-07-12 ENCOUNTER — Encounter: Payer: Self-pay | Admitting: *Deleted

## 2017-07-12 ENCOUNTER — Encounter: Payer: Self-pay | Admitting: Family Medicine

## 2017-07-12 ENCOUNTER — Encounter: Payer: Self-pay | Admitting: Radiation Oncology

## 2017-07-12 ENCOUNTER — Other Ambulatory Visit: Payer: BLUE CROSS/BLUE SHIELD

## 2017-07-12 DIAGNOSIS — Z510 Encounter for antineoplastic radiation therapy: Secondary | ICD-10-CM | POA: Diagnosis not present

## 2017-07-12 DIAGNOSIS — K219 Gastro-esophageal reflux disease without esophagitis: Secondary | ICD-10-CM | POA: Insufficient documentation

## 2017-07-12 DIAGNOSIS — Z825 Family history of asthma and other chronic lower respiratory diseases: Secondary | ICD-10-CM | POA: Insufficient documentation

## 2017-07-12 DIAGNOSIS — Z79899 Other long term (current) drug therapy: Secondary | ICD-10-CM | POA: Insufficient documentation

## 2017-07-12 DIAGNOSIS — Z888 Allergy status to other drugs, medicaments and biological substances status: Secondary | ICD-10-CM | POA: Insufficient documentation

## 2017-07-12 DIAGNOSIS — Z8249 Family history of ischemic heart disease and other diseases of the circulatory system: Secondary | ICD-10-CM | POA: Diagnosis not present

## 2017-07-12 DIAGNOSIS — I10 Essential (primary) hypertension: Secondary | ICD-10-CM | POA: Insufficient documentation

## 2017-07-12 DIAGNOSIS — Z8042 Family history of malignant neoplasm of prostate: Secondary | ICD-10-CM | POA: Insufficient documentation

## 2017-07-12 DIAGNOSIS — C20 Malignant neoplasm of rectum: Secondary | ICD-10-CM

## 2017-07-12 DIAGNOSIS — Z9889 Other specified postprocedural states: Secondary | ICD-10-CM | POA: Diagnosis not present

## 2017-07-12 LAB — MISCELLANEOUS TEST

## 2017-07-12 MED FILL — CAPECITABINE 500 MG TABLET: 500 | 30 days supply | Qty: 154 | Fill #0

## 2017-07-12 NOTE — Progress Notes (Signed)
Please see the Nurse Progress Note in the MD Initial Consult Encounter for this patient. 

## 2017-07-12 NOTE — Progress Notes (Signed)
Radiation Oncology         [phone] ________________________________  Name: Paul [name]        MRN: [id number]  Date of Service: 07/12/2017 DOB: July 26, 1966  EG:[id number], Alinda Sierras, MD  Michael Boston, MD     REFERRING PHYSICIAN: Michael Boston, MD   DIAGNOSIS: The encounter diagnosis was Rectal adenocarcinoma [facility]).   HISTORY OF PRESENT ILLNESS: Paul [name] is a 51 y.o. male seen at the request of Dr. Johney Maine for a newly diagnosed rectal cancer. The patient presented for evaluation with rectal bleeding and loose bowel movements. A CT A/P on 07/03/17 revealed a possible mass in the rectum, and a CEA at that time was 2.7. Colonoscopy with Dr. Paulita Fujita on 07/05/17 revealed a poorly differentiated adenocarcinoma. He has undergone a staging MRI A/P on 07/06/17. The tumor appeared to be 7-8 cm from the anal verge on re review in conference, and the tumor extended beyond the muscularis 1-2 mm, and no evidence of pelvic side wall adenopathy, there was a small mesorectal node that was 9 mm, and his diagnosis a Stage IIIB, T3N1 cancer. He's been seen by Dr. Johney Maine and met with Dr. Burr Medico. Our plan is to offer neoadjuvant chemoRT followed by surgical resection. He comes today to discuss details regarding radiation.    PREVIOUS RADIATION THERAPY: No   PAST MEDICAL HISTORY:  Past Medical History:  Diagnosis Date  . Allergy    grass, dander  . Colon cancer ([place]) 07/05/2017   rectal adenocarcinoma  . GERD (gastroesophageal reflux disease)   . History of meningitis   . Hypertension        PAST SURGICAL HISTORY: Past Surgical History:  Procedure Laterality Date  . COLONOSCOPY WITH PROPOFOL Left 07/05/2017   Procedure: COLONOSCOPY WITH PROPOFOL;  Surgeon: Arta Silence, MD;  Location: [facility];  Service: Endoscopy;  Laterality: Left;     FAMILY HISTORY:  Family History  Problem Relation Age of Onset  . Hypertension Mother   . Asthma Father   . Cancer Father        Prostate  .  Asthma Son   . Asthma Son   . Asthma Daughter      SOCIAL HISTORY:  reports that he has never smoked. He has never used smokeless tobacco. He reports that he does not drink alcohol or use drugs. The patient is from [place] and is married. 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    ALLERGIES: Chloroquine   MEDICATIONS:  Current Outpatient Prescriptions  Medication Sig Dispense Refill  . amLODipine (NORVASC) 5 MG tablet TAKE 1 TABLET DAILY 90 tablet 1  . calcium carbonate (OS-CAL - DOSED IN MG OF ELEMENTAL CALCIUM) 1250 (500 Ca) MG tablet Take 1 tablet by mouth daily as needed (for bones).    . cetirizine (ZYRTEC) 10 MG tablet Take 10 mg by mouth daily as needed for allergies.    . Garlic 2 MG CAPS Take 2 mg by mouth daily.    . Multiple Vitamin (MULTIVITAMIN WITH MINERALS) TABS tablet Take 1 tablet by mouth daily.    Marland Kitchen omeprazole (PRILOSEC) 40 MG capsule Take 40 mg by mouth daily as needed (heart burn).     . capecitabine (XELODA) 500 MG tablet Take 4 tabs (2050m) in AM & 3 tabs (15074m in PM, within 3064mof meals, days of radiation only, M-F (Patient not taking: Reported on 07/12/2017) 210 tablet 0   No current facility-administered medications for this encounter.      REVIEW  OF SYSTEMS: On review of systems, the patient reports that he is doing well overall. He denies any chest pain, shortness of breath, cough, fevers, chills, night sweats, unintended weight changes. He has noticed occasional abdominal discomfort in the morning that he describes as related to stress in the low abdomen. He has about 3-4 bowel movements per day and notes that these are loose. He does not have pain with defecation. He has rectal bleeding with most stools.  He denies any bladder disturbances, and denies  nausea or vomiting. He denies any new musculoskeletal or joint aches or pains. A complete review of systems is obtained and is otherwise negative.     PHYSICAL EXAM:  Wt Readings from Last 3  Encounters:  07/12/17 189 lb 9.6 oz (86 kg)  07/11/17 194 lb (88 kg)  07/10/17 190 lb 11.2 oz (86.5 kg)   Temp Readings from Last 3 Encounters:  07/12/17 98.6 F (37 C) (Oral)  07/11/17 98.4 F (36.9 C) (Oral)  07/10/17 98.6 F (37 C) (Oral)   BP Readings from Last 3 Encounters:  07/12/17 138/86  07/11/17 130/80  07/10/17 (!) 152/93   Pulse Readings from Last 3 Encounters:  07/12/17 87  07/11/17 88  07/10/17 70   Pain Assessment Pain Score: 0-No pain/10  In general this is a well appearing African male in no acute distress. He is alert and oriented x4 and appropriate throughout the examination. HEENT reveals that the patient is normocephalic, atraumatic. EOMs are intact. PERRLA. Skin is intact without any evidence of gross lesions. Cardiovascular exam reveals a regular rate and rhythm, no clicks rubs or murmurs are auscultated. Chest is clear to auscultation bilaterally. Lymphatic assessment is performed and does not reveal any adenopathy in the cervical, supraclavicular, axillary, or inguinal chains. Abdomen has active bowel sounds in all quadrants and is intact. The abdomen is soft, non tender, non distended. Lower extremities are negative for pretibial pitting edema, deep calf tenderness, cyanosis or clubbing.   ECOG = 1  0 - Asymptomatic (Fully active, able to carry on all predisease activities without restriction)  1 - Symptomatic but completely ambulatory (Restricted in physically strenuous activity but ambulatory and able to carry out work of a light or sedentary nature. For example, light housework, office work)  2 - Symptomatic, <50% in bed during the day (Ambulatory and capable of all self care but unable to carry out any work activities. Up and about more than 50% of waking hours)  3 - Symptomatic, >50% in bed, but not bedbound (Capable of only limited self-care, confined to bed or chair 50% or more of waking hours)  4 - Bedbound (Completely disabled. Cannot carry on  any self-care. Totally confined to bed or chair)  5 - Death   Eustace Pen MM, Creech RH, Tormey DC, et al. [phone]). "Toxicity and response criteria of the [facility]". [place] Oncol. 5 (6): 649-55    LABORATORY DATA:  Lab Results  Component Value Date   WBC 4.3 07/04/2017   HGB 13.3 07/04/2017   HCT 39.6 07/04/2017   MCV 83.4 07/04/2017   PLT 194 07/04/2017   Lab Results  Component Value Date   NA 139 07/05/2017   K 3.5 07/05/2017   CL 106 07/05/2017   CO2 28 07/05/2017   Lab Results  Component Value Date   ALT 23 07/04/2017   AST 21 07/04/2017   ALKPHOS 59 07/04/2017   BILITOT 0.7 07/04/2017      RADIOGRAPHY: Mr  Pelvis W Wo Contrast  Result Date: 07/06/2017 CLINICAL DATA:  Four-week history of watery diarrhea and abdominal cramping. Abdominal CT demonstrating rectosigmoid mass. Colonoscopy yesterday demonstrating malignant appearing mass at the rectosigmoid junction. EXAM: MRI PELVIS WITHOUT CONTRAST TECHNIQUE: Multiplanar multisequence MR imaging of the pelvis was performed. No intravenous contrast was administered. Small amount of [place] gel was administered per rectum to optimize tumor evaluation. COMPARISON:  CT 07/03/2017 FINDINGS: TUMOR LOCATION Location from Anal Verge: High-greater than 11 cm from the anal verge including on image 23/ series 4. At the rectosigmoid junction. Shortest Distance from Tumor to Anal Sphincter:  5-6 cm cm TUMOR DESCRIPTION Circumferential Extent:  Circumferential. Craniocaudal Extent:  4.5 cm T - CATEGORY Extension through Muscularis Propria: Yes -T3. Suspect extra colonic tumor extension most apparent about the 2 o'clock position, including on image 14/series 9 and image 15/series 13. Maximum extension beyond Muscularis Propria:  1-2 mm (early T3 = <69m; advanced T3 = >577m Extramural vascular invasion/tumor thrombus:  No Invasion of Anterior Peritoneal Reflection:  No Involvement of Adjacent Organs or Pelvic Sidewall  Structures:  No N - CATEGORY Mesorectal Lymph Nodes >=59m759mYes. Nodes within the mesorectum and sigmoid meso colon measure maximally 9 mm on image 10/ series 13. Extra-mesorectal Lymphadenopathy:  No Other: No significant free fluid. IMPRESSION: Rectosigmoid adenocarcinoma T stage:  Early T3 Rectal adenocarcinoma N stage:   N1 Distance from tumor to the anal sphincter is on the order of 5-6 cm. Electronically Signed   By: KylAbigail MiyamotoD.   On: 07/06/2017 15:12   Ct Abdomen Pelvis W Contrast  Result Date: 07/03/2017 CLINICAL DATA:  Abdominal pain.  Bloody diarrhea. EXAM: CT ABDOMEN AND PELVIS WITH CONTRAST TECHNIQUE: Multidetector CT imaging of the abdomen and pelvis was performed using the standard protocol following bolus administration of intravenous contrast. CONTRAST:  100m66mOVUE-300 IOPAMIDOL (ISOVUE-300) INJECTION 61% COMPARISON:  None. FINDINGS: Lower chest: Lung bases clear.  Heart size within normal limits Hepatobiliary: Normal liver.  Gallbladder and bile ducts normal Pancreas: Negative Spleen: Negative Adrenals/Urinary Tract: Normal kidneys. No renal mass or obstruction or stone. Normal bladder Stomach/Bowel: Stomach and small bowel normal. Negative for bowel obstruction. Normal appendix. Nondilated colon. Possible soft tissue mass in the mid rectum which could represent neoplasm. Correlate with endoscopy. Vascular/Lymphatic: Negative Reproductive: Mild prostate enlargement. 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 Endoscopy recommended for further evaluation. Otherwise negative. Electronically Signed   By: CharFranchot Gallo.   On: 07/03/2017 14:24       IMPRESSION/PLAN: 1. Stage IIIB, cT3N1M0 adenocarcinoma of the rectum. Dr. MoodLisbeth Renshawcusses the pathology findings and reviews the nature of rectal disease. We reviewed the rationale for concurrent chemoRT followed by  surgical resection. We discussed the risks, benefits, short, and long term effects of radiotherapy, and the patient is interested in proceeding. Dr. MoodLisbeth Renshawcusses the delivery and logistics of radiotherapy and he would recommend a course of 6 weeks of treatment. Written consent is obtained and placed in the chart, a copy was provided to the patient. He will return tomorrow for simulation. 2. Possible genetic predisposition to malignancy. Given his age he is eligible for genetic testing and will be referred for this.   The above documentation reflects my direct findings during this shared patient visit. Please see the separate note by Dr. MoodLisbeth Renshawthis date for the remainder of the patient's plan of care.    AlisCarola Rhine  PAC

## 2017-07-12 NOTE — Telephone Encounter (Signed)
Oral Chemotherapy Pharmacist Encounter   I spoke with patient for overview of new oral chemotherapy medication: Xeloda.   Counseled patient on administration, dosing, side effects, safe handling, and monitoring. Patient will take Xeloda 500mg  tablets, 4 tablets (2000mg ) by mouth in AM and 3 tabs (1500mg ) by mouth in PM, within 30 minutes of finishing meals, on days of radiation only. Xeloda and radiation start date: 07/23/17  Side effects include but not limited to: fatigue, decerased blood counts, GI upset, diarrhea, and hand-foot syndrome. Patient counseled to get loperamide to keep at home and will call the office if diarrhea develops.    Reviewed with patient importance of keeping a medication schedule and plan for any missed doses.  Mr. Rafalski voiced understanding and appreciation.   All questions answered.  Patient's Xeloda prescription will be ready for pick-up this afternoon (07/12/17) after radiation consult appointment.  Per insurance requirement, Xeloda will be dispensed as 2 separate fills, the 1st for a 30-calendar day supply, copayment $10. Second fill will be for the remainder of therapy. Patient aware of need for 2 fills for entire course of concurrent chemoradiation. Patient knows not to start Xeloda until the 1st day of radiation.  Patient knows to call the office with questions or concerns. Oral Oncology Clinic will continue to follow.  Thank you,  Johny Drilling, PharmD, BCPS, BCOP 07/12/2017  11:43 AM Oral Oncology Clinic [phone]

## 2017-07-13 ENCOUNTER — Ambulatory Visit
Admission: RE | Admit: 2017-07-13 | Discharge: 2017-07-13 | Disposition: A | Discharge status: Home / Self Care | Payer: BLUE CROSS/BLUE SHIELD | Source: Ambulatory Visit | Attending: Radiation Oncology | Admitting: Radiation Oncology

## 2017-07-13 DIAGNOSIS — C20 Malignant neoplasm of rectum: Secondary | ICD-10-CM

## 2017-07-13 DIAGNOSIS — Z510 Encounter for antineoplastic radiation therapy: Secondary | ICD-10-CM | POA: Diagnosis not present

## 2017-07-15 NOTE — Progress Notes (Addendum)
  Radiation Oncology         [phone] ________________________________  Name: Paul [name] MRN: [id number]  Date: 07/13/2017  DOB: [birth year]   SIMULATION AND TREATMENT PLANNING NOTE  DIAGNOSIS:     ICD-10-CM   1. Rectal adenocarcinoma ([place]) C20      The patient presented for simulation for the patient's upcoming course of radiation for the diagnosis of rectal cancer. The patient was placed in a supine position. A customized vac-lock bag was constructed to aid in patient immobilization on. This complex treatment device will be used on a daily basis during the treatment. In this fashion a CT scan was obtained through the pelvic region and the isocenter was placed near midline within the pelvis. Surface markings were placed.  The patient's imaging was loaded into the radiation treatment planning system. The patient will initially be planned to receive a course of radiation to a dose of 45 Gy. This will be accomplished in 25 fractions at 1.8 gray per fraction. This initial treatment will correspond to a 3-D conformal technique. The target has been contoured in addition to the rectum, bladder and femoral heads. Dose volume histograms of each of these structures have been requested and these will be carefully reviewed as part of the 3-D conformal treatment planning process. To accomplish this initial treatment, 3 customized blocks have been designed for this purpose. Each of these 4 complex treatment devices will be used on a daily basis during the initial course of the treatment. It is anticipated that the patient will then receive a boost for an additional 5.4 Gy. The anticipated total dose therefore will be 50.4 Gy.    Special treatment procedure The patient will receive chemotherapy during the course of radiation treatment. The patient may experience increased or overlapping toxicity due to this combined-modality approach and the patient will be monitored for such problems. This may include  extra lab work as necessary. This therefore constitutes a special treatment procedure.    ________________________________  Jodelle Gross, MD, PhD

## 2017-07-15 NOTE — Progress Notes (Signed)
  Radiation Oncology         [phone]) [phone] ________________________________  Name: Paul [name] MRN: [id number]  Date: 07/13/2017  DOB: [birth year]  Optical Surface Tracking Plan:  Since intensity modulated radiotherapy (IMRT) and 3D conformal radiation treatment methods are predicated on accurate and precise positioning for treatment, intrafraction motion monitoring is medically necessary to ensure accurate and safe treatment delivery.  The ability to quantify intrafraction motion without excessive ionizing radiation dose can only be performed with optical surface tracking. Accordingly, surface imaging offers the opportunity to obtain 3D measurements of patient position throughout IMRT and 3D treatments without excessive radiation exposure.  I am ordering optical surface tracking for this patient's upcoming course of radiotherapy. ________________________________  Kyung Rudd, MD 07/15/2017 11:40 AM    Reference:   Ursula Alert, J, et al. Surface imaging-based analysis of intrafraction motion for breast radiotherapy patients.Journal of [place], n. 6, nov. 2014. ISSN [id number].   Available at: <http://www.jacmp.org/index.php/jacmp/article/view/4957>.

## 2017-07-17 ENCOUNTER — Telehealth: Payer: Self-pay | Admitting: Hematology

## 2017-07-17 ENCOUNTER — Telehealth: Payer: Self-pay

## 2017-07-17 DIAGNOSIS — Z510 Encounter for antineoplastic radiation therapy: Secondary | ICD-10-CM | POA: Diagnosis not present

## 2017-07-17 NOTE — Telephone Encounter (Signed)
Spoke with patient regarding his appts that were scheduled per 9/18 sch msg.

## 2017-07-17 NOTE — Telephone Encounter (Signed)
Carlene RN case Freight forwarder with [employer] called to introduce herself and to get some information. Called back and LVM.  Her protected phone is [phone]

## 2017-07-17 NOTE — Addendum Note (Signed)
Encounter addended by: Kyung Rudd, MD on: 07/17/2017  9:46 AM<BR>    Actions taken: Sign clinical note

## 2017-07-17 NOTE — Telephone Encounter (Signed)
S/w Charlene. Discussed plan of care with Charlene.  She stated pt did not mention having xeloda in his possession to start with his radiation tomorrow. Called and s/w pt. He does have his xeloda.

## 2017-07-18 ENCOUNTER — Encounter (HOSPITAL_COMMUNITY): Payer: Self-pay

## 2017-07-18 ENCOUNTER — Ambulatory Visit
Admission: RE | Admit: 2017-07-18 | Discharge: 2017-07-18 | Disposition: A | Discharge status: Home / Self Care | Payer: BLUE CROSS/BLUE SHIELD | Source: Ambulatory Visit | Attending: Radiation Oncology | Admitting: Radiation Oncology

## 2017-07-18 ENCOUNTER — Ambulatory Visit (HOSPITAL_COMMUNITY)
Admission: RE | Admit: 2017-07-18 | Discharge: 2017-07-18 | Disposition: A | Discharge status: Home / Self Care | Payer: BLUE CROSS/BLUE SHIELD | Source: Ambulatory Visit | Attending: Hematology | Admitting: Hematology

## 2017-07-18 ENCOUNTER — Inpatient Hospital Stay
Admission: RE | Admit: 2017-07-18 | Discharge: 2017-07-18 | Disposition: A | Discharge status: Home / Self Care | Payer: Self-pay | Source: Ambulatory Visit | Attending: Radiation Oncology | Admitting: Radiation Oncology

## 2017-07-18 DIAGNOSIS — C20 Malignant neoplasm of rectum: Secondary | ICD-10-CM | POA: Insufficient documentation

## 2017-07-18 DIAGNOSIS — Z510 Encounter for antineoplastic radiation therapy: Secondary | ICD-10-CM | POA: Diagnosis not present

## 2017-07-18 NOTE — Progress Notes (Signed)
Done under linac treatment encounter

## 2017-07-18 NOTE — Progress Notes (Signed)
Pt education done,my business card, sitz bath, peri bottle ,radiation therapy and you book given to patient, discussed ways to manage, n,v,d, skin irritation, pain, weight loss, urinary symptoms, , for diarrhea, low fiber diet, no fresh fruits/vegs, imodium prn, ,nausea, Rx  May be needed,  may need to eat 5-6 smaller meals with snacks , ensure,boost in between, luke warm bath,showers, no rubbing scrubbing or scratching area,  Pat dry, use sizt bath prn, warm water only for soothing bottom, peri bottle and baby wipes, tucks prn,  Take rest periods as needed and get plenty sleep, do some form of exercise daily,increase protein in diet, stay hydrated, drink plenty water, bladder spasm,s burning, frequency can occur, inform MD when seen weekly and prn, teach back given 11:37 AM

## 2017-07-19 ENCOUNTER — Ambulatory Visit
Admission: RE | Admit: 2017-07-19 | Discharge: 2017-07-19 | Disposition: A | Discharge status: Home / Self Care | Payer: BLUE CROSS/BLUE SHIELD | Source: Ambulatory Visit | Attending: Radiation Oncology | Admitting: Radiation Oncology

## 2017-07-19 ENCOUNTER — Other Ambulatory Visit: Payer: Self-pay | Admitting: Radiation Oncology

## 2017-07-19 DIAGNOSIS — R112 Nausea with vomiting, unspecified: Secondary | ICD-10-CM

## 2017-07-19 DIAGNOSIS — C20 Malignant neoplasm of rectum: Secondary | ICD-10-CM

## 2017-07-19 DIAGNOSIS — Z510 Encounter for antineoplastic radiation therapy: Secondary | ICD-10-CM | POA: Diagnosis not present

## 2017-07-19 DIAGNOSIS — T451X5A Adverse effect of antineoplastic and immunosuppressive drugs, initial encounter: Secondary | ICD-10-CM

## 2017-07-19 MED ORDER — ONDANSETRON HCL 8 MG PO TABS: 8.0000 mg | ORAL_TABLET | Freq: Three times a day (TID) | ORAL | 3 refills | Status: DC | PRN

## 2017-07-20 ENCOUNTER — Ambulatory Visit
Admission: RE | Admit: 2017-07-20 | Discharge: 2017-07-20 | Disposition: A | Discharge status: Home / Self Care | Payer: BLUE CROSS/BLUE SHIELD | Source: Ambulatory Visit | Attending: Radiation Oncology | Admitting: Radiation Oncology

## 2017-07-20 DIAGNOSIS — Z510 Encounter for antineoplastic radiation therapy: Secondary | ICD-10-CM | POA: Diagnosis not present

## 2017-07-22 NOTE — Progress Notes (Signed)
[place]  [phone] [phone]  Clinic Follow Up Note   Patient Care Team: Eulas Post, MD as PCP - General (Family Medicine) Arta Silence, MD as Consulting Physician (Gastroenterology) Michael Boston, MD as Consulting Physician (General Surgery) Kyung Rudd, MD as Consulting Physician (Radiation Oncology) 07/23/2017   CHIEF COMPLAINTS:  Rectal cancer  Oncology History   Cancer Staging Rectal adenocarcinoma [facility]) Staging form: Colon and Rectum, AJCC 8th Edition - Clinical stage from 07/06/2017: Stage IIIB (cT3, cN1, cM0) - Signed by [name], NP on 07/10/2017      Rectal adenocarcinoma ([name])   07/03/2017 Imaging    CT ABD PELVIS W CONTRAST IMPRESSION: Possible mass in the rectum compatible carcinoma. Endoscopy recommended for further evaluation. Otherwise negative.      07/03/2017 Tumor Marker    CEA 2.7      07/05/2017 Initial Biopsy    Rectum biopsy -invasive adenocarcinoma, poorly differentiated      07/05/2017 Procedure    Colonoscopy Per Dr. Paulita Fujita Findings: The perianal and digital rectal examinations were normal.  Internal hemorrhoids were found during retroflexion. The hemorrhoids were moderate. No additional abnormalities were found on retroflexion.  A fungating, sessile and ulcerated partially obstructing large mass was found in the recto-sigmoid colon.The mass was partially circumferential (involving two-thirds of the lumen circumference). Oozing was present. Area was successfully injected with 2 mL [place] ink for tattooing. This was biopsied with a cold forceps for histology.      07/05/2017 Initial Diagnosis    Rectal adenocarcinoma ([place])      07/06/2017 Imaging    Staging MRI ABD/PELVIS revealed clinical stage T3N1M0, IIIb       07/18/2017 -  Radiation Therapy    The patient began radiation therapy on 07/18/17 supervised by Dr Lisbeth Renshaw.       07/18/2017 Imaging    CT CHEST   IMPRESSION: Negative. No evidence of thoracic metastatic disease or other significant abnormality.       07/18/2017 -  Chemotherapy    Xeloda 2000mg  am and 1500mg  pm every 12 hours, with concurrent radiation       HISTORY OF PRESENTING ILLNESS:  Paul [name] 51 y.o. male is here because of newly diagnosed rectal cancer. He has a long standing history of loose stool and developed diarrhea within the last 4 weeks. He noticed 3 episodes in 1 day of blood on toilet paper which prompted him to go to the ER on 07/03/2017. A CT ABD and pelvis revealed a mass in the rectum compatible with carcinoma. Incidentally, he tested positive for shiga like toxin producing E coli while in the ED. He was discharged from ED with no new medication. Colonoscopy on 07/05/2017 per Dr. Paulita Fujita revealed a fungating, sessile and ulcerated partially obstructing large mass in the recto-sigmoid colon. The mass was partially circumferential involving two-thirds of the lumen circumference. Oozing was present. Biopsy on 07/05/17 confirmed invasive adenocarcinoma of the rectum. Staging MRI on 07/06/17 revealed extension through muscularis propria 1-12mm and nodes within the mesorectum and sigmoid meso colon measuring 7mm, indicating cT3N1Mo, stage IIIb. He was referred to GI surgery Dr. Johney Maine, seen 07/09/2017.  Today he presents to clinic with a friend, referred by Dr. Johney Maine. He feels well overall with good appetite, stable weight, and no fatigue. His diarrhea has resolved but reports 2-3 loose stools per day, associated with cramping. No nausea, vomiting, or abdominal pain.   CURRENT THERAPY: Xeloda 2000mg  am and 1500mg  pm every 12 hours, with concurrent  radiation,started on 07/18/2017  INTERIM HISTORY:  Dekendrick Uzelac returns today for scheduled follow up. Pt began radiation therapy last week, and he reports that he has been experiencing some issues with nausea since beginning this. He did have several episodes of emesis and he  reports that this will relieve his nausea temporarily. He was prescribed anti-emetics to help control this which initially did not help with his nausea, but it has mildly helped since. He also reports that due to his nausea that he has eaten less than he typically would due to his decreased appetite. Additionally, he reports that he has been more constipated recently, noting that he has had to strain more with bowel movements and he is mostly passing flatus when attempting to void. He took some ginger root and Pepcid at home which did not help with this. He notes that with his constipation he will have some mild, intermittent lower abdominal pain as well which will improve following bowel movements. He reports that he did have his yearly influenza vaccination with his PCP approximately one month ago.   MEDICAL HISTORY:  Past Medical History:  Diagnosis Date  . Allergy    grass, dander  . Colon cancer ([place]) 07/05/2017   rectal adenocarcinoma  . GERD (gastroesophageal reflux disease)   . History of meningitis   . Hypertension    SURGICAL HISTORY: Past Surgical History:  Procedure Laterality Date  . COLONOSCOPY WITH PROPOFOL Left 07/05/2017   Procedure: COLONOSCOPY WITH PROPOFOL;  Surgeon: Arta Silence, MD;  Location: [place];  Service: Endoscopy;  Laterality: Left;   SOCIAL HISTORY: Social History   Social History  . Marital status: Married    Spouse name: N/A  . Number of children: N/A  . Years of education: N/A   Occupational History  . Not on file.   Social History Main Topics  . Smoking status: Never Smoker  . Smokeless tobacco: Never Used  . Alcohol use No  . Drug use: No  . Sexual activity: Not on file   Other Topics Concern  . Not on file   Social History Narrative   Originally from [place], [place]   Patient is married with 3 children, ages 65, 72, and 102. He lives in a home with his wife and kids. 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 He works 3-11:30 pm shift; he has morning/daytime availability for appointments and treatments. He prefers to work during anticipated treatment but can apply for [employer]. He will send appropriate paperwork when the time comes.   FAMILY HISTORY: Family History  Problem Relation Age of Onset  . Hypertension Mother   . Asthma Father   . Cancer Father        Prostate  . Asthma Son   . Asthma Son   . Asthma Daughter    ALLERGIES:  is allergic to chloroquine.  MEDICATIONS:  Current Outpatient Prescriptions  Medication Sig Dispense Refill  . amLODipine (NORVASC) 5 MG tablet TAKE 1 TABLET DAILY 90 tablet 1  . calcium carbonate (OS-CAL - DOSED IN MG OF ELEMENTAL CALCIUM) 1250 (500 Ca) MG tablet Take 1 tablet by mouth daily as needed (for bones).    . capecitabine (XELODA) 500 MG tablet Take 4 tabs (2000mg ) in AM & 3 tabs (1500mg ) in PM, within 36min of meals, days of radiation only, M-F 210 tablet 0  . cetirizine (ZYRTEC) 10 MG tablet Take 10 mg by  mouth daily as needed for allergies.    . Garlic 2 MG CAPS Take 2 mg by mouth daily.    . Multiple Vitamin (MULTIVITAMIN WITH MINERALS) TABS tablet Take 1 tablet by mouth daily.    Marland Kitchen omeprazole (PRILOSEC) 40 MG capsule Take 40 mg by mouth daily as needed (heart burn).     . ondansetron (ZOFRAN) 8 MG tablet Take 1 tablet (8 mg total) by mouth every 8 (eight) hours as needed for nausea or vomiting. 30 tablet 3  . prochlorperazine (COMPAZINE) 10 MG tablet Take 1 tablet (10 mg total) by mouth every 6 (six) hours as needed for nausea or vomiting. (Patient not taking: Reported on 07/23/2017) 30 tablet 0   No current facility-administered medications for this visit.    REVIEW OF SYSTEMS:   Constitutional: Denies fatigue, fevers, chills, abnormal night sweats, or weight loss. Eyes: Denies blurriness of vision, double vision or watery eyes Ears, nose, mouth, throat, and face: Denies mucositis or  sore throat Respiratory: Denies cough, dyspnea or wheezes Cardiovascular: Denies palpitation, chest discomfort or lower extremity swelling Gastrointestinal:  (+) nausea, vomiting, constipation, intermittent lower abdominal pain Skin: Denies abnormal skin rashes Lymphatics: Denies new lymphadenopathy or easy bruising Neurological:Denies numbness, tingling or new weaknesses Behavioral/Psych: Mood is stable, no new changes  All other systems were reviewed with the patient and are negative.  PHYSICAL EXAMINATION:  ECOG PERFORMANCE STATUS: 0  Vitals:   07/23/17 0850  BP: (!) 141/87  Pulse: 69  Resp: 20  Temp: 98.6 F (37 C)  SpO2: 100%   Filed Weights   07/23/17 0850  Weight: 191 lb 3.2 oz (86.7 kg)   GENERAL:alert, no distress and comfortable SKIN: skin color, texture, turgor are normal, no rashes or significant lesions EYES: normal, conjunctiva are pink and non-injected, sclera clear OROPHARYNX:no exudate, no erythema and lips, buccal mucosa, and tongue normal  NECK: supple, thyroid normal size, non-tender, without nodularity LYMPH:  no palpable cervical or supraclavicular lymphadenopathy  LUNGS: clear to auscultation bilaterally with normal breathing effort HEART: regular rate & rhythm, S1 and S2 present; no murmurs and no lower extremity edema ABDOMEN:abdomen soft, flat, non-tender and normal bowel sounds. No palpable hepatosplenomegaly or masses. MSK:no cyanosis of digits and no clubbing  PSYCH: alert & oriented x 3 with fluent speech NEURO: no focal motor/sensory deficits RECTAL: exam deferred   LABORATORY DATA:  I have reviewed the data as listed CBC Latest Ref Rng & Units 07/23/2017 07/04/2017 07/03/2017  WBC 4.0 - 10.3 10e3/uL 3.2(L) 4.3 5.3  Hemoglobin 13.0 - 17.1 g/dL 13.7 13.3 13.4  Hematocrit 38.4 - 49.9 % 40.8 39.6 40.3  Platelets 140 - 400 10e3/uL 222 194 220   CMP Latest Ref Rng & Units 07/23/2017 07/05/2017 07/04/2017  Glucose 70 - 140 mg/dl 103 104(H) 110(H)   BUN 7.0 - 26.0 mg/dL 12.5 5(L) 5(L)  Creatinine 0.7 - 1.3 mg/dL 0.9 0.83 0.76  Sodium 136 - 145 mEq/L 141 139 137  Potassium 3.5 - 5.1 mEq/L 4.0 3.5 3.6  Chloride 101 - 111 mmol/L - 106 106  CO2 22 - 29 mEq/L 31(H) 28 27  Calcium 8.4 - 10.4 mg/dL 9.6 8.6(L) 8.8(L)  Total Protein 6.4 - 8.3 g/dL 7.4 - 6.2(L)  Total Bilirubin 0.20 - 1.20 mg/dL 0.45 - 0.7  Alkaline Phos 40 - 150 U/L 78 - 59  AST 5 - 34 U/L 24 - 21  ALT 0 - 55 U/L 25 - 23   PATHOLOGY RESULTS: Diagnosis 07/05/17 Rectum, biopsy -  INVASIVE ADENOCARCINOMA. - SEE COMMENT.  Microscopic Comment The adenocarcinoma is poorly differentiated. Dr. Vicente Males has reviewed the case and concurs with this interpretation. Dr. Paulita Fujita was paged on 07/06/17. (JBK:gt, 07/06/17)  RADIOGRAPHIC STUDIES: I have personally reviewed the radiological images as listed and agreed with the findings in the report. Ct Chest Wo Contrast  Result Date: 07/18/2017 CLINICAL DATA:  Newly diagnosed rectal carcinoma.  Staging. EXAM: CT CHEST WITHOUT CONTRAST TECHNIQUE: Multidetector CT imaging of the chest was performed following the standard protocol without IV contrast. COMPARISON:  None. FINDINGS: Cardiovascular:  No acute findings. Mediastinum/Nodes: No masses or pathologically enlarged lymph nodes identified on this unenhanced exam. Lungs/Pleura: No pulmonary infiltrate or mass identified. No effusion present. Upper Abdomen:  Unremarkable. Musculoskeletal:  No suspicious bone lesions. IMPRESSION: Negative. No evidence of thoracic metastatic disease or other significant abnormality. Electronically Signed   By: Earle Gell M.D.   On: 07/18/2017 13:21   Mr Pelvis W Wo Contrast  Result Date: 07/06/2017 CLINICAL DATA:  Four-week history of watery diarrhea and abdominal cramping. Abdominal CT demonstrating rectosigmoid mass. Colonoscopy yesterday demonstrating malignant appearing mass at the rectosigmoid junction. EXAM: MRI PELVIS WITHOUT CONTRAST TECHNIQUE:  Multiplanar multisequence MR imaging of the pelvis was performed. No intravenous contrast was administered. Small amount of [place] gel was administered per rectum to optimize tumor evaluation. COMPARISON:  CT 07/03/2017 FINDINGS: TUMOR LOCATION Location from Anal Verge: High-greater than 11 cm from the anal verge including on image 23/ series 4. At the rectosigmoid junction. Shortest Distance from Tumor to Anal Sphincter:  5-6 cm cm TUMOR DESCRIPTION Circumferential Extent:  Circumferential. Craniocaudal Extent:  4.5 cm T - CATEGORY Extension through Muscularis Propria: Yes -T3. Suspect extra colonic tumor extension most apparent about the 2 o'clock position, including on image 14/series 9 and image 15/series 13. Maximum extension beyond Muscularis Propria:  1-2 mm (early T3 = <60mm; advanced T3 = >41mm) Extramural vascular invasion/tumor thrombus:  No Invasion of Anterior Peritoneal Reflection:  No Involvement of Adjacent Organs or Pelvic Sidewall Structures:  No N - CATEGORY Mesorectal Lymph Nodes >=59mm: Yes. Nodes within the mesorectum and sigmoid meso colon measure maximally 9 mm on image 10/ series 13. Extra-mesorectal Lymphadenopathy:  No Other: No significant free fluid. IMPRESSION: Rectosigmoid adenocarcinoma T stage:  Early T3 Rectal adenocarcinoma N stage:   N1 Distance from tumor to the anal sphincter is on the order of 5-6 cm. Electronically Signed   By: [name] M.D.   On: 07/06/2017 15:12   Ct Abdomen Pelvis W Contrast  Result Date: 07/03/2017 CLINICAL DATA:  Abdominal pain.  Bloody diarrhea. EXAM: CT ABDOMEN AND PELVIS WITH CONTRAST TECHNIQUE: Multidetector CT imaging of the abdomen and pelvis was performed using the standard protocol following bolus administration of intravenous contrast. CONTRAST:  185mL ISOVUE-300 IOPAMIDOL (ISOVUE-300) INJECTION 61% COMPARISON:  None. FINDINGS: Lower chest: Lung bases clear.  Heart size within normal limits Hepatobiliary: Normal liver.  Gallbladder and bile  ducts normal Pancreas: Negative Spleen: Negative Adrenals/Urinary Tract: Normal kidneys. No renal mass or obstruction or stone. Normal bladder Stomach/Bowel: Stomach and small bowel normal. Negative for bowel obstruction. Normal appendix. Nondilated colon. Possible soft tissue mass in the mid rectum which could represent neoplasm. Correlate with endoscopy. Vascular/Lymphatic: Negative Reproductive: Mild prostate enlargement. 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 Endoscopy recommended for further evaluation. Otherwise negative. Electronically Signed   By: Franchot Gallo M.D.   On: 07/03/2017  14:24   COLONOSCOPY: Dr. Paulita Fujita 07/05/2017 - Internal hemorrhoids. - Likely malignant partially obstructing tumor in the recto-sigmoid colon. Injected. Biopsied.  ASSESSMENT & PLAN:  Paul [name] is a 51 y.o.  with history of allergies, HTN, GERD, and chronic low back pain. He presents with rectal cancer diagnosed 07/05/2017.  1. Poorly differentiated invasive rectal adenocarcinoma, cT3N1M0, stage IIIb -I reviewed his biopsy and CT, MRI scan results with the patient and his friend in detail.  -His tumor is located in the rectosigmoid colon. -We discussed he has likely locally advanced stage IIIb disease based on tumor size, invasion into the muscularis, and positive lymph node, no distant metastasis on day CT and MRI of abdomen and pelvis. -To complete his staging work up, I will order a CT chest to evaluate for metastatic disease in the chest.  -He has seen Dr. Johney Maine on 07/09/17 who recommends neoadjuvant chemoradiation to help shrink tumor followed by surgical low anterior resection with possible adjuvant chemotherapy after surgery. We are in agreement with this plan. -I discussed chemotherapy with radiosensitizing Xeloda twice daily to be taken concurrently with radiation Monday -  Friday, no chemo or radiation on weekends for approximately 6 weeks  -We also discussed alternative intravenous chemotherapy 5-FU with concurrent radiation. Due to the convenience, if he does not have high co-pay, I'll prefer use Xeloda. He agrees. -I reviewed potential side effects of chemoradiation including but not limited to fatigue, skin toxicity, mucositis, diarrhea, rectal pain and bleeding, bladder irritation or hematuria, nausea, vomiting, dehydration, treatment interruption, decreased blood counts, and risk of infection. He agrees to proceed. Chemotherapy consent was obtained today. -The goal of therapy is curative -He has started concurrent chemoRT on 07/18/17.  -CT Chest was performed on 07/18/17, I discussed this with him in detail and informed him that there was NED within the chest on his scans.  -Has began taking Xeloda, he is tolerating this relatively well, but has mild to moderate nausea. He also began concurrent radiotherapy. We will continue these treatment lines for now as he tolerates.  -Labs reviewed; some leukopenia but adequate to continue treatment.  -I offered him his yearly flu shot but he reports that he had this with his PCP.   2. Genetics -Due to his age, he is a candidate for genetics referral to assess whether genetic mutation contributed to his cancer, he agrees  -Referral to genetics placed previously  3. Nausea and constipation -He was prescribed Zofran, but he states that this did not help like he would like to control his nausea. I will add on Compazine which I informed him he can take 2-3 times a day with Zofran for better control.  -Informed him that he can take Colace as a stool softener for his constipation, if this does not help I also told him that he could take a Laxative 2-3 times daily, such as Senokot or Colace.   PLAN: -Continue weekly lab, he is scheduled to see [place] next week  -F/u w/ Lacie on 10/22 and me on 10/29 -Continue concurrent  chemoradiation.  Orders Placed This Encounter  Procedures  . CBC with Differential    Standing Status:   Standing    Number of Occurrences:   50    Standing Expiration Date:   07/22/2022  . Comprehensive metabolic panel    Standing Status:   Standing    Number of Occurrences:   50    Standing Expiration Date:   07/22/2022  . CEA    Standing  Status:   Standing    Number of Occurrences:   20    Standing Expiration Date:   07/22/2022   All questions were answered. The patient knows to call the clinic with any problems, questions or concerns. I spent 25 counseling the patient face to face. The total time spent in the appointment was 30 and more than 50% was on counseling.  Truitt Merle, MD 07/23/2017 9:53 AM   This document serves as a record of services personally performed by Truitt Merle, MD. It was created on her behalf by Reola Mosher, a trained medical scribe. The creation of this record is based on the scribe's personal observations and the provider's statements to them. This document has been checked and approved by the attending provider.

## 2017-07-23 ENCOUNTER — Ambulatory Visit (HOSPITAL_BASED_OUTPATIENT_CLINIC_OR_DEPARTMENT_OTHER): Payer: BLUE CROSS/BLUE SHIELD | Admitting: Hematology

## 2017-07-23 ENCOUNTER — Ambulatory Visit: Payer: BLUE CROSS/BLUE SHIELD | Admitting: Hematology

## 2017-07-23 ENCOUNTER — Ambulatory Visit
Admission: RE | Admit: 2017-07-23 | Discharge: 2017-07-23 | Disposition: A | Discharge status: Home / Self Care | Payer: BLUE CROSS/BLUE SHIELD | Source: Ambulatory Visit | Attending: Radiation Oncology | Admitting: Radiation Oncology

## 2017-07-23 ENCOUNTER — Telehealth: Payer: Self-pay | Admitting: Hematology

## 2017-07-23 ENCOUNTER — Encounter: Payer: Self-pay | Admitting: Hematology

## 2017-07-23 ENCOUNTER — Other Ambulatory Visit (HOSPITAL_BASED_OUTPATIENT_CLINIC_OR_DEPARTMENT_OTHER): Payer: BLUE CROSS/BLUE SHIELD

## 2017-07-23 ENCOUNTER — Other Ambulatory Visit: Payer: BLUE CROSS/BLUE SHIELD

## 2017-07-23 VITALS — BP 141/87 | HR 69 | Temp 98.6°F | Resp 20 | Ht 73.0 in | Wt 191.2 lb

## 2017-07-23 DIAGNOSIS — I10 Essential (primary) hypertension: Secondary | ICD-10-CM

## 2017-07-23 DIAGNOSIS — R110 Nausea: Secondary | ICD-10-CM

## 2017-07-23 DIAGNOSIS — C20 Malignant neoplasm of rectum: Secondary | ICD-10-CM

## 2017-07-23 DIAGNOSIS — K5900 Constipation, unspecified: Secondary | ICD-10-CM | POA: Diagnosis not present

## 2017-07-23 DIAGNOSIS — Z510 Encounter for antineoplastic radiation therapy: Secondary | ICD-10-CM | POA: Diagnosis not present

## 2017-07-23 LAB — COMPREHENSIVE METABOLIC PANEL
ALT: 25 U/L (ref 0–55)
AST: 24 U/L (ref 5–34)
Albumin: 4 g/dL (ref 3.5–5.0)
Alkaline Phosphatase: 78 U/L (ref 40–150)
Anion Gap: 6 mEq/L (ref 3–11)
BILIRUBIN TOTAL: 0.45 mg/dL (ref 0.20–1.20)
BUN: 12.5 mg/dL (ref 7.0–26.0)
CHLORIDE: 104 meq/L (ref 98–109)
CO2: 31 meq/L — AB (ref 22–29)
CREATININE: 0.9 mg/dL (ref 0.7–1.3)
Calcium: 9.6 mg/dL (ref 8.4–10.4)
EGFR: >90 mL/min/{1.73_m2} (ref >90)
GLUCOSE: 103 mg/dL (ref 70–140)
Potassium: 4 mEq/L (ref 3.5–5.1)
SODIUM: 141 meq/L (ref 136–145)
TOTAL PROTEIN: 7.4 g/dL (ref 6.4–8.3)

## 2017-07-23 LAB — CBC WITH DIFFERENTIAL/PLATELET
BASO%: 0.8 % (ref 0.0–2.0)
Basophils Absolute: 0 10*3/uL (ref 0.0–0.1)
EOS ABS: 0.2 10*3/uL (ref 0.0–0.5)
EOS%: 6.3 % (ref 0.0–7.0)
HCT: 40.8 % (ref 38.4–49.9)
HGB: 13.7 g/dL (ref 13.0–17.1)
LYMPH%: 23.2 % (ref 14.0–49.0)
MCH: 29 pg (ref 27.2–33.4)
MCHC: 33.7 g/dL (ref 32.0–36.0)
MCV: 86.1 fL (ref 79.3–98.0)
MONO#: 0.3 10*3/uL (ref 0.1–0.9)
MONO%: 8.3 % (ref 0.0–14.0)
NEUT%: 61.4 % (ref 39.0–75.0)
NEUTROS ABS: 2 10*3/uL (ref 1.5–6.5)
Platelets: 222 10*3/uL (ref 140–400)
RBC: 4.74 10*6/uL (ref 4.20–5.82)
RDW: 13.9 % (ref 11.0–14.6)
WBC: 3.2 10*3/uL — AB (ref 4.0–10.3)
lymph#: 0.8 10*3/uL — ABNORMAL LOW (ref 0.9–3.3)

## 2017-07-23 LAB — CEA (IN HOUSE-CHCC): CEA (CHCC-In House): 2.9 ng/mL (ref 0.00–5.00)

## 2017-07-23 MED ORDER — PROCHLORPERAZINE MALEATE 10 MG PO TABS: 10.0000 mg | ORAL_TABLET | Freq: Four times a day (QID) | ORAL | 0 refills | Status: DC | PRN

## 2017-07-23 NOTE — Telephone Encounter (Signed)
Gave avs and calendar for October  °

## 2017-07-24 ENCOUNTER — Ambulatory Visit
Admission: RE | Admit: 2017-07-24 | Discharge: 2017-07-24 | Disposition: A | Discharge status: Home / Self Care | Payer: BLUE CROSS/BLUE SHIELD | Source: Ambulatory Visit | Attending: Radiation Oncology | Admitting: Radiation Oncology

## 2017-07-24 DIAGNOSIS — Z510 Encounter for antineoplastic radiation therapy: Secondary | ICD-10-CM | POA: Diagnosis not present

## 2017-07-25 ENCOUNTER — Ambulatory Visit
Admission: RE | Admit: 2017-07-25 | Discharge: 2017-07-25 | Disposition: A | Discharge status: Home / Self Care | Payer: BLUE CROSS/BLUE SHIELD | Source: Ambulatory Visit | Attending: Radiation Oncology | Admitting: Radiation Oncology

## 2017-07-25 DIAGNOSIS — Z510 Encounter for antineoplastic radiation therapy: Secondary | ICD-10-CM | POA: Diagnosis not present

## 2017-07-26 ENCOUNTER — Ambulatory Visit
Admission: RE | Admit: 2017-07-26 | Discharge: 2017-07-26 | Disposition: A | Discharge status: Home / Self Care | Payer: BLUE CROSS/BLUE SHIELD | Source: Ambulatory Visit | Attending: Radiation Oncology | Admitting: Radiation Oncology

## 2017-07-26 DIAGNOSIS — Z510 Encounter for antineoplastic radiation therapy: Secondary | ICD-10-CM | POA: Diagnosis not present

## 2017-07-27 ENCOUNTER — Ambulatory Visit
Admission: RE | Admit: 2017-07-27 | Discharge: 2017-07-27 | Disposition: A | Discharge status: Home / Self Care | Payer: BLUE CROSS/BLUE SHIELD | Source: Ambulatory Visit | Attending: Radiation Oncology | Admitting: Radiation Oncology

## 2017-07-27 DIAGNOSIS — Z510 Encounter for antineoplastic radiation therapy: Secondary | ICD-10-CM | POA: Diagnosis not present

## 2017-07-27 NOTE — Progress Notes (Signed)
[name]  [phone] [phone]) [phone]  Clinic Follow Up Note   Patient Care Team: Eulas Post, MD as PCP - General (Family Medicine) Arta Silence, MD as Consulting Physician (Gastroenterology) Michael Boston, MD as Consulting Physician (General Surgery) Kyung Rudd, MD as Consulting Physician (Radiation Oncology) 07/30/2017   CHIEF COMPLAINTS:  Rectal cancer  Oncology History   Cancer Staging Rectal adenocarcinoma [facility]) Staging form: Colon and Rectum, AJCC 8th Edition - Clinical stage from 07/06/2017: Stage IIIB (cT3, cN1, cM0) - Signed by [name], NP on 07/10/2017      Rectal adenocarcinoma ([place])   07/03/2017 Imaging    CT ABD PELVIS W CONTRAST IMPRESSION: Possible mass in the rectum compatible carcinoma. Endoscopy recommended for further evaluation. Otherwise negative.      07/03/2017 Tumor Marker    CEA 2.7      07/05/2017 Initial Biopsy    Rectum biopsy -invasive adenocarcinoma, poorly differentiated      07/05/2017 Procedure    Colonoscopy Per Dr. Paulita Fujita Findings: The perianal and digital rectal examinations were normal.  Internal hemorrhoids were found during retroflexion. The hemorrhoids were moderate. No additional abnormalities were found on retroflexion.  A fungating, sessile and ulcerated partially obstructing large mass was found in the recto-sigmoid colon.The mass was partially circumferential (involving two-thirds of the lumen circumference). Oozing was present. Area was successfully injected with 2 mL [place] ink for tattooing. This was biopsied with a cold forceps for histology.      07/05/2017 Initial Diagnosis    Rectal adenocarcinoma ([place])      07/06/2017 Imaging    Staging MRI ABD/PELVIS revealed clinical stage T3N1M0, IIIb       07/18/2017 -  Radiation Therapy    The patient began radiation therapy on 07/18/17 supervised by Dr Lisbeth Renshaw.       07/18/2017 Imaging    CT CHEST   IMPRESSION: Negative. No evidence of thoracic metastatic disease or other significant abnormality.       07/18/2017 -  Chemotherapy    Xeloda 2000mg  am and 1500mg  pm every 12 hours, with concurrent radiation       HISTORY OF PRESENTING ILLNESS:  [name] 51 y.o. male is here because of newly diagnosed rectal cancer. He has a long standing history of loose stool and developed diarrhea within the last 4 weeks. He noticed 3 episodes in 1 day of blood on toilet paper which prompted him to go to the ER on 07/03/2017. A CT ABD and pelvis revealed a mass in the rectum compatible with carcinoma. Incidentally, he tested positive for shiga like toxin producing E coli while in the ED. He was discharged from ED with no new medication. Colonoscopy on 07/05/2017 per Dr. Paulita Fujita revealed a fungating, sessile and ulcerated partially obstructing large mass in the recto-sigmoid colon. The mass was partially circumferential involving two-thirds of the lumen circumference. Oozing was present. Biopsy on 07/05/17 confirmed invasive adenocarcinoma of the rectum. Staging MRI on 07/06/17 revealed extension through muscularis propria 1-45mm and nodes within the mesorectum and sigmoid meso colon measuring 9mm, indicating cT3N1Mo, stage IIIb. He was referred to GI surgery Dr. Johney Maine, seen 07/09/2017.  Today he presents to clinic with a friend, referred by Dr. Johney Maine. He feels well overall with good appetite, stable weight, and no fatigue. His diarrhea has resolved but reports 2-3 loose stools per day, associated with cramping. No nausea, vomiting, or abdominal pain.   CURRENT THERAPY: Xeloda 2000mg  am and 1500mg  pm every 12 hours, with concurrent  radiation, started on 07/18/2017  INTERIM HISTORY:  Marqual Mi returns today for scheduled follow up. He presents to the clinic today noting no issues with chemoradiation currently. He notes the anti-constipation medication has helped him and his BM are better. He takes Miralax  twice daily. Nausea has improved as well. His symptoms are controlled.  He is still working and he is able to handle it currently. He will bring FMLA paperwork with him next time to fill out. If needed he will take time off as he continues treatment.    MEDICAL HISTORY:  Past Medical History:  Diagnosis Date  . Allergy    grass, dander  . Colon cancer ([place] ) 07/05/2017   rectal adenocarcinoma  . GERD (gastroesophageal reflux disease)   . History of meningitis   . Hypertension    SURGICAL HISTORY: Past Surgical History:  Procedure Laterality Date  . COLONOSCOPY WITH PROPOFOL Left 07/05/2017   Procedure: COLONOSCOPY WITH PROPOFOL;  Surgeon: Arta Silence, MD;  Location: [facility];  Service: Endoscopy;  Laterality: Left;   SOCIAL HISTORY: Social History   Social History  . Marital status: Married    Spouse name: N/A  . Number of children: N/A  . Years of education: N/A   Occupational History  . Not on file.   Social History Main Topics  . Smoking status: Never Smoker  . Smokeless tobacco: Never Used  . Alcohol use No  . Drug use: No  . Sexual activity: Not on file   Other Topics Concern  . Not on file   Social History Narrative   Originally from [place], [place]   Patient is married with 3 children, ages 15, 64, and 49. He lives in a home with his wife and kids. 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 He works 3-11:30 pm shift; he has morning/daytime availability for appointments and treatments. He prefers to work during anticipated treatment but can apply for [employer]. He will send appropriate paperwork when the time comes.   FAMILY HISTORY: Family History  Problem Relation Age of Onset  . Hypertension Mother   . Asthma Father   . Cancer Father        Prostate  . Asthma Son   . Asthma Son   . Asthma Daughter    ALLERGIES:  is allergic to chloroquine.  MEDICATIONS:   Current Outpatient Prescriptions  Medication Sig Dispense Refill  . amLODipine (NORVASC) 5 MG tablet TAKE 1 TABLET DAILY 90 tablet 1  . calcium carbonate (OS-CAL - DOSED IN MG OF ELEMENTAL CALCIUM) 1250 (500 Ca) MG tablet Take 1 tablet by mouth daily as needed (for bones).    . capecitabine (XELODA) 500 MG tablet Take 4 tabs (2000mg ) in AM & 3 tabs (1500mg ) in PM, within 67min of meals, days of radiation only, M-F 210 tablet 0  . cetirizine (ZYRTEC) 10 MG tablet Take 10 mg by mouth daily as needed for allergies.    . Garlic 2 MG CAPS Take 2 mg by mouth daily.    . Multiple Vitamin (MULTIVITAMIN WITH MINERALS) TABS tablet Take 1 tablet by mouth daily.    Marland Kitchen omeprazole (PRILOSEC) 40 MG capsule Take 40 mg by mouth daily as needed (heart burn).     . ondansetron (ZOFRAN) 8 MG tablet Take 1 tablet (8 mg total) by mouth every 8 (eight) hours as needed for nausea or vomiting. 30 tablet 3  . prochlorperazine (COMPAZINE)  10 MG tablet Take 1 tablet (10 mg total) by mouth every 6 (six) hours as needed for nausea or vomiting. 30 tablet 0   No current facility-administered medications for this visit.    REVIEW OF SYSTEMS:   Constitutional: Denies fatigue, fevers, chills, abnormal night sweats, or weight loss. Eyes: Denies blurriness of vision, double vision or watery eyes Ears, nose, mouth, throat, and face: Denies mucositis or sore throat Respiratory: Denies cough, dyspnea or wheezes Cardiovascular: Denies palpitation, chest discomfort or lower extremity swelling Gastrointestinal:  (+) nausea, vomiting, constipation, controlled Skin: Denies abnormal skin rashes Lymphatics: Denies new lymphadenopathy or easy bruising Neurological:Denies numbness, tingling or new weaknesses Behavioral/Psych: Mood is stable, no new changes  All other systems were reviewed with the patient and are negative.  PHYSICAL EXAMINATION:  ECOG PERFORMANCE STATUS: 0  Vitals:   07/30/17 1050  BP: (!) 137/92  Pulse: 60   Resp: 18  Temp: 98.6 F (37 C)  SpO2: 100%   Filed Weights   07/30/17 1050  Weight: 192 lb 12.8 oz (87.5 kg)     GENERAL:alert, no distress and comfortable SKIN: skin color, texture, turgor are normal, no rashes or significant lesions EYES: normal, conjunctiva are pink and non-injected, sclera clear OROPHARYNX:no exudate, no erythema and lips, buccal mucosa, and tongue normal  NECK: supple, thyroid normal size, non-tender, without nodularity LYMPH:  no palpable cervical or supraclavicular lymphadenopathy  LUNGS: clear to auscultation bilaterally with normal breathing effort HEART: regular rate & rhythm, S1 and S2 present; no murmurs and no lower extremity edema ABDOMEN:abdomen soft, flat, non-tender and normal bowel sounds. No palpable hepatosplenomegaly or masses. MSK:no cyanosis of digits and no clubbing  PSYCH: alert & oriented x 3 with fluent speech NEURO: no focal motor/sensory deficits RECTAL: exam deferred   LABORATORY DATA:  I have reviewed the data as listed CBC Latest Ref Rng & Units 07/30/2017 07/23/2017 07/04/2017  WBC 4.0 - 10.3 10e3/uL 2.3(L) 3.2(L) 4.3  Hemoglobin 13.0 - 17.1 g/dL 12.5(L) 13.7 13.3  Hematocrit 38.4 - 49.9 % 37.7(L) 40.8 39.6  Platelets 140 - 400 10e3/uL 174 222 194   CMP Latest Ref Rng & Units 07/30/2017 07/23/2017 07/05/2017  Glucose 70 - 140 mg/dl 93 103 104(H)  BUN 7.0 - 26.0 mg/dL 13.2 12.5 5(L)  Creatinine 0.7 - 1.3 mg/dL 0.8 0.9 0.83  Sodium 136 - 145 mEq/L 141 141 139  Potassium 3.5 - 5.1 mEq/L 4.3 4.0 3.5  Chloride 101 - 111 mmol/L - - 106  CO2 22 - 29 mEq/L 30(H) 31(H) 28  Calcium 8.4 - 10.4 mg/dL 9.4 9.6 8.6(L)  Total Protein 6.4 - 8.3 g/dL 6.9 7.4 -  Total Bilirubin 0.20 - 1.20 mg/dL 0.26 0.45 -  Alkaline Phos 40 - 150 U/L 72 78 -  AST 5 - 34 U/L 31 24 -  ALT 0 - 55 U/L 35 25 -   PATHOLOGY RESULTS: Diagnosis 07/05/17 Rectum, biopsy - INVASIVE ADENOCARCINOMA. - SEE COMMENT. Microscopic Comment The adenocarcinoma is poorly  differentiated. Dr. Vicente Males has reviewed the case and concurs with this interpretation. Dr. Paulita Fujita was paged on 07/06/17. (JBK:gt, 07/06/17)  PROCEDURES  COLONOSCOPY: Dr. Paulita Fujita 07/05/2017 - Internal hemorrhoids. - Likely malignant partially obstructing tumor in the recto-sigmoid colon. Injected. Biopsied.   RADIOGRAPHIC STUDIES: I have personally reviewed the radiological images as listed and agreed with the findings in the report. Ct Chest Wo Contrast  Result Date: 07/18/2017 CLINICAL DATA:  Newly diagnosed rectal carcinoma.  Staging. EXAM: CT CHEST WITHOUT CONTRAST TECHNIQUE:  Multidetector CT imaging of the chest was performed following the standard protocol without IV contrast. COMPARISON:  None. FINDINGS: Cardiovascular:  No acute findings. Mediastinum/Nodes: No masses or pathologically enlarged lymph nodes identified on this unenhanced exam. Lungs/Pleura: No pulmonary infiltrate or mass identified. No effusion present. Upper Abdomen:  Unremarkable. Musculoskeletal:  No suspicious bone lesions. IMPRESSION: Negative. No evidence of thoracic metastatic disease or other significant abnormality. Electronically Signed   By: Earle Gell M.D.   On: 07/18/2017 13:21   Mr Pelvis W Wo Contrast  Result Date: 07/06/2017 CLINICAL DATA:  Four-week history of watery diarrhea and abdominal cramping. Abdominal CT demonstrating rectosigmoid mass. Colonoscopy yesterday demonstrating malignant appearing mass at the rectosigmoid junction. EXAM: MRI PELVIS WITHOUT CONTRAST TECHNIQUE: Multiplanar multisequence MR imaging of the pelvis was performed. No intravenous contrast was administered. Small amount of [place] gel was administered per rectum to optimize tumor evaluation. COMPARISON:  CT 07/03/2017 FINDINGS: TUMOR LOCATION Location from Anal Verge: High-greater than 11 cm from the anal verge including on image 23/ series 4. At the rectosigmoid junction. Shortest Distance from Tumor to Anal Sphincter:  5-6 cm cm TUMOR  DESCRIPTION Circumferential Extent:  Circumferential. Craniocaudal Extent:  4.5 cm T - CATEGORY Extension through Muscularis Propria: Yes -T3. Suspect extra colonic tumor extension most apparent about the 2 o'clock position, including on image 14/series 9 and image 15/series 13. Maximum extension beyond Muscularis Propria:  1-2 mm (early T3 = <42mm; advanced T3 = >44mm) Extramural vascular invasion/tumor thrombus:  No Invasion of Anterior Peritoneal Reflection:  No Involvement of Adjacent Organs or Pelvic Sidewall Structures:  No N - CATEGORY Mesorectal Lymph Nodes >=61mm: Yes. Nodes within the mesorectum and sigmoid meso colon measure maximally 9 mm on image 10/ series 13. Extra-mesorectal Lymphadenopathy:  No Other: No significant free fluid. IMPRESSION: Rectosigmoid adenocarcinoma T stage:  Early T3 Rectal adenocarcinoma N stage:   N1 Distance from tumor to the anal sphincter is on the order of 5-6 cm. Electronically Signed   By: [name] M.D.   On: 07/06/2017 15:12   Ct Abdomen Pelvis W Contrast  Result Date: 07/03/2017 CLINICAL DATA:  Abdominal pain.  Bloody diarrhea. EXAM: CT ABDOMEN AND PELVIS WITH CONTRAST TECHNIQUE: Multidetector CT imaging of the abdomen and pelvis was performed using the standard protocol following bolus administration of intravenous contrast. CONTRAST:  13mL ISOVUE-300 IOPAMIDOL (ISOVUE-300) INJECTION 61% COMPARISON:  None. FINDINGS: Lower chest: Lung bases clear.  Heart size within normal limits Hepatobiliary: Normal liver.  Gallbladder and bile ducts normal Pancreas: Negative Spleen: Negative Adrenals/Urinary Tract: Normal kidneys. No renal mass or obstruction or stone. Normal bladder Stomach/Bowel: Stomach and small bowel normal. Negative for bowel obstruction. Normal appendix. Nondilated colon. Possible soft tissue mass in the mid rectum which could represent neoplasm. Correlate with endoscopy. Vascular/Lymphatic: Negative Reproductive: Mild prostate enlargement. Seminal  vesicles also prominent Other: Negative for free fluid or adenopathy Musculoskeletal: Negative IMPRESSION: Possible mass in the rectum compatible carcinoma. Endoscopy recommended for further evaluation. Otherwise negative. Electronically Signed   By: Franchot Gallo M.D.   On: 07/03/2017 14:24    ASSESSMENT & PLAN:  Paul [name] is a 51 y.o.  with history of allergies, HTN, GERD, and chronic low back pain. He presents with rectal cancer diagnosed 07/05/2017.  1. Poorly differentiated invasive rectal adenocarcinoma, cT3N1M0, stage IIIb -I reviewed his biopsy and CT, MRI scan results with the patient and his friend in detail.  -His tumor is located in the rectosigmoid colon. -We discussed he has likely locally advanced  stage IIIb disease based on tumor size, invasion into the muscularis, and positive lymph node, no distant metastasis on day CT and MRI of abdomen and pelvis. -CT chest was negative for metastasis -He has seen Dr. Johney Maine on 07/09/17 who recommends neoadjuvant chemoradiation to help shrink tumor followed by surgical low anterior resection with possible adjuvant chemotherapy after surgery. We are in agreement with this plan. -He has started concurrent chemoRT with Xeloda on 07/18/17.  -He has mild nausea from Xeloda, this is well controlled with antiemetics.  -Labs reviewed; some leukopenia but adequate to continue treatment. I discussed that if he is neutropenic is worse, I'll reduce his relatives, if [place] below 0.5 we will need to hold chemo and radiation. I discussed staying away from those who are sick and if he develops a fever of 100.4 or higher he should contact or go to ER.  -F/u with Lacie on 10/15 to see if we need to adjust to his Xeloda dose  2. Genetics -Due to his age, he is a candidate for genetics referral to assess whether genetic mutation contributed to his cancer, he agrees  -Referral to genetics placed previously  3. Nausea and constipation -He was prescribed Zofran,  but he states that this did not help like he would like to control his nausea. I will add on Compazine which I informed him he can take 2-3 times a day with Zofran for better control.  -Informed him that he can take Colace as a stool softener for his constipation, if this does not help I also told him that he could take a Laxative 2-3 times daily, such as Senokot or Colace.  -Well controlled lately with Miralax, Zofran and compazine. I suggest if needed he can increase his Miralax to 3-4 times daily.   4. Neutropenia, secondary to chemotherapy -ANC 1.4 today, we'll continue weekly lab -if he develops moderate to severe neutropenia, will reduce or hold Xeloda  -If ANC<0.5, will inform rad/onc to hold radiation   PLAN: -Continue weekly lab -Add f/u at 9am to see Lacie on 10/15  -Continue concurrent chemoradiation. Will reduce Xeloda dose if ANC<1.0K, and hold chemo and or radiation if ANC<0.5K  No orders of the defined types were placed in this encounter.  All questions were answered. The patient knows to call the clinic with any problems, questions or concerns.  Truitt Merle, MD 07/30/2017 8:07 PM   This document serves as a record of services personally performed by Truitt Merle, MD. It was created on her behalf by Joslyn Devon, a trained medical scribe. The creation of this record is based on the scribe's personal observations and the provider's statements to them. This document has been checked and approved by the attending provider.

## 2017-07-30 ENCOUNTER — Other Ambulatory Visit (HOSPITAL_BASED_OUTPATIENT_CLINIC_OR_DEPARTMENT_OTHER): Payer: BLUE CROSS/BLUE SHIELD

## 2017-07-30 ENCOUNTER — Ambulatory Visit
Admission: RE | Admit: 2017-07-30 | Discharge: 2017-07-30 | Disposition: A | Discharge status: Home / Self Care | Payer: BLUE CROSS/BLUE SHIELD | Source: Ambulatory Visit | Attending: Radiation Oncology | Admitting: Radiation Oncology

## 2017-07-30 ENCOUNTER — Ambulatory Visit (HOSPITAL_BASED_OUTPATIENT_CLINIC_OR_DEPARTMENT_OTHER): Payer: BLUE CROSS/BLUE SHIELD | Admitting: Hematology

## 2017-07-30 ENCOUNTER — Other Ambulatory Visit: Payer: BLUE CROSS/BLUE SHIELD

## 2017-07-30 ENCOUNTER — Encounter: Payer: Self-pay | Admitting: Hematology

## 2017-07-30 ENCOUNTER — Telehealth: Payer: Self-pay | Admitting: Hematology

## 2017-07-30 ENCOUNTER — Ambulatory Visit: Payer: BLUE CROSS/BLUE SHIELD | Admitting: Hematology

## 2017-07-30 VITALS — BP 137/92 | HR 60 | Temp 98.6°F | Resp 18 | Ht 73.0 in | Wt 192.8 lb

## 2017-07-30 DIAGNOSIS — C20 Malignant neoplasm of rectum: Secondary | ICD-10-CM | POA: Diagnosis not present

## 2017-07-30 DIAGNOSIS — I10 Essential (primary) hypertension: Secondary | ICD-10-CM | POA: Diagnosis not present

## 2017-07-30 DIAGNOSIS — D701 Agranulocytosis secondary to cancer chemotherapy: Secondary | ICD-10-CM

## 2017-07-30 DIAGNOSIS — R110 Nausea: Secondary | ICD-10-CM

## 2017-07-30 DIAGNOSIS — Z510 Encounter for antineoplastic radiation therapy: Secondary | ICD-10-CM | POA: Diagnosis not present

## 2017-07-30 LAB — COMPREHENSIVE METABOLIC PANEL
ALT: 35 U/L (ref 0–55)
ANION GAP: 5 meq/L (ref 3–11)
AST: 31 U/L (ref 5–34)
Albumin: 3.9 g/dL (ref 3.5–5.0)
Alkaline Phosphatase: 72 U/L (ref 40–150)
BILIRUBIN TOTAL: 0.26 mg/dL (ref 0.20–1.20)
BUN: 13.2 mg/dL (ref 7.0–26.0)
CALCIUM: 9.4 mg/dL (ref 8.4–10.4)
CHLORIDE: 106 meq/L (ref 98–109)
CO2: 30 meq/L — AB (ref 22–29)
Creatinine: 0.8 mg/dL (ref 0.7–1.3)
Glucose: 93 mg/dl (ref 70–140)
Potassium: 4.3 mEq/L (ref 3.5–5.1)
Sodium: 141 mEq/L (ref 136–145)
Total Protein: 6.9 g/dL (ref 6.4–8.3)

## 2017-07-30 LAB — CBC WITH DIFFERENTIAL/PLATELET
BASO%: 0.4 % (ref 0.0–2.0)
BASOS ABS: 0 10*3/uL (ref 0.0–0.1)
EOS ABS: 0.2 10*3/uL (ref 0.0–0.5)
EOS%: 7.1 % — ABNORMAL HIGH (ref 0.0–7.0)
HEMATOCRIT: 37.7 % — AB (ref 38.4–49.9)
HGB: 12.5 g/dL — ABNORMAL LOW (ref 13.0–17.1)
LYMPH%: 22.7 % (ref 14.0–49.0)
MCH: 28.7 pg (ref 27.2–33.4)
MCHC: 33.2 g/dL (ref 32.0–36.0)
MCV: 86.7 fL (ref 79.3–98.0)
MONO#: 0.2 10*3/uL (ref 0.1–0.9)
MONO%: 8.4 % (ref 0.0–14.0)
NEUT#: 1.4 10*3/uL — ABNORMAL LOW (ref 1.5–6.5)
NEUT%: 61.4 % (ref 39.0–75.0)
PLATELETS: 174 10*3/uL (ref 140–400)
RBC: 4.35 10*6/uL (ref 4.20–5.82)
RDW: 13.7 % (ref 11.0–14.6)
WBC: 2.3 10*3/uL — ABNORMAL LOW (ref 4.0–10.3)
lymph#: 0.5 10*3/uL — ABNORMAL LOW (ref 0.9–3.3)

## 2017-07-30 NOTE — Telephone Encounter (Signed)
Unable to schedule appt 10/8 los due to block on schedule - patient is aware of what date and time to see MD and did not want AVS . Message Audie Clear to override

## 2017-07-31 ENCOUNTER — Ambulatory Visit
Admission: RE | Admit: 2017-07-31 | Discharge: 2017-07-31 | Disposition: A | Discharge status: Home / Self Care | Payer: BLUE CROSS/BLUE SHIELD | Source: Ambulatory Visit | Attending: Radiation Oncology | Admitting: Radiation Oncology

## 2017-07-31 DIAGNOSIS — Z510 Encounter for antineoplastic radiation therapy: Secondary | ICD-10-CM | POA: Diagnosis not present

## 2017-08-01 ENCOUNTER — Ambulatory Visit
Admission: RE | Admit: 2017-08-01 | Discharge: 2017-08-01 | Disposition: A | Discharge status: Home / Self Care | Payer: BLUE CROSS/BLUE SHIELD | Source: Ambulatory Visit | Attending: Radiation Oncology | Admitting: Radiation Oncology

## 2017-08-01 DIAGNOSIS — Z510 Encounter for antineoplastic radiation therapy: Secondary | ICD-10-CM | POA: Diagnosis not present

## 2017-08-02 ENCOUNTER — Ambulatory Visit
Admission: RE | Admit: 2017-08-02 | Discharge: 2017-08-02 | Disposition: A | Discharge status: Home / Self Care | Payer: BLUE CROSS/BLUE SHIELD | Source: Ambulatory Visit | Attending: Radiation Oncology | Admitting: Radiation Oncology

## 2017-08-02 DIAGNOSIS — Z510 Encounter for antineoplastic radiation therapy: Secondary | ICD-10-CM | POA: Diagnosis not present

## 2017-08-03 ENCOUNTER — Ambulatory Visit
Admission: RE | Admit: 2017-08-03 | Discharge: 2017-08-03 | Disposition: A | Discharge status: Home / Self Care | Payer: BLUE CROSS/BLUE SHIELD | Source: Ambulatory Visit | Attending: Radiation Oncology | Admitting: Radiation Oncology

## 2017-08-03 DIAGNOSIS — Z510 Encounter for antineoplastic radiation therapy: Secondary | ICD-10-CM | POA: Diagnosis not present

## 2017-08-06 ENCOUNTER — Other Ambulatory Visit (HOSPITAL_BASED_OUTPATIENT_CLINIC_OR_DEPARTMENT_OTHER): Payer: BLUE CROSS/BLUE SHIELD

## 2017-08-06 ENCOUNTER — Encounter: Payer: Self-pay | Admitting: Nurse Practitioner

## 2017-08-06 ENCOUNTER — Ambulatory Visit (HOSPITAL_BASED_OUTPATIENT_CLINIC_OR_DEPARTMENT_OTHER): Payer: BLUE CROSS/BLUE SHIELD | Admitting: Nurse Practitioner

## 2017-08-06 ENCOUNTER — Ambulatory Visit
Admission: RE | Admit: 2017-08-06 | Discharge: 2017-08-06 | Disposition: A | Discharge status: Home / Self Care | Payer: BLUE CROSS/BLUE SHIELD | Source: Ambulatory Visit | Attending: Radiation Oncology | Admitting: Radiation Oncology

## 2017-08-06 VITALS — BP 134/94 | HR 63 | Temp 98.1°F | Resp 18 | Ht 73.0 in | Wt 195.1 lb

## 2017-08-06 DIAGNOSIS — Z510 Encounter for antineoplastic radiation therapy: Secondary | ICD-10-CM | POA: Diagnosis not present

## 2017-08-06 DIAGNOSIS — C20 Malignant neoplasm of rectum: Secondary | ICD-10-CM | POA: Diagnosis not present

## 2017-08-06 DIAGNOSIS — D708 Other neutropenia: Secondary | ICD-10-CM | POA: Diagnosis not present

## 2017-08-06 LAB — COMPREHENSIVE METABOLIC PANEL
ALBUMIN: 4 g/dL (ref 3.5–5.0)
ALK PHOS: 75 U/L (ref 40–150)
ALT: 53 U/L (ref 0–55)
ANION GAP: 7 meq/L (ref 3–11)
AST: 45 U/L — ABNORMAL HIGH (ref 5–34)
BILIRUBIN TOTAL: 0.34 mg/dL (ref 0.20–1.20)
BUN: 10.9 mg/dL (ref 7.0–26.0)
CALCIUM: 9.3 mg/dL (ref 8.4–10.4)
CO2: 28 mEq/L (ref 22–29)
CREATININE: 0.9 mg/dL (ref 0.7–1.3)
Chloride: 105 mEq/L (ref 98–109)
Glucose: 113 mg/dl (ref 70–140)
Potassium: 4 mEq/L (ref 3.5–5.1)
Sodium: 141 mEq/L (ref 136–145)
TOTAL PROTEIN: 7.1 g/dL (ref 6.4–8.3)

## 2017-08-06 LAB — CBC WITH DIFFERENTIAL/PLATELET
BASO%: 0.7 % (ref 0.0–2.0)
Basophils Absolute: 0 10*3/uL (ref 0.0–0.1)
EOS ABS: 0.1 10*3/uL (ref 0.0–0.5)
EOS%: 5.5 % (ref 0.0–7.0)
HEMATOCRIT: 39.6 % (ref 38.4–49.9)
HGB: 13.2 g/dL (ref 13.0–17.1)
LYMPH#: 0.3 10*3/uL — AB (ref 0.9–3.3)
LYMPH%: 14 % (ref 14.0–49.0)
MCH: 29.1 pg (ref 27.2–33.4)
MCHC: 33.3 g/dL (ref 32.0–36.0)
MCV: 87.6 fL (ref 79.3–98.0)
MONO#: 0.3 10*3/uL (ref 0.1–0.9)
MONO%: 13.1 % (ref 0.0–14.0)
NEUT%: 66.7 % (ref 39.0–75.0)
NEUTROS ABS: 1.5 10*3/uL (ref 1.5–6.5)
PLATELETS: 144 10*3/uL (ref 140–400)
RBC: 4.52 10*6/uL (ref 4.20–5.82)
RDW: 14.3 % (ref 11.0–14.6)
WBC: 2.3 10*3/uL — AB (ref 4.0–10.3)

## 2017-08-06 MED ORDER — PROCHLORPERAZINE MALEATE 10 MG PO TABS: 10.0000 mg | ORAL_TABLET | Freq: Four times a day (QID) | ORAL | 2 refills | Status: DC | PRN

## 2017-08-06 NOTE — Progress Notes (Signed)
[place]  [phone] [phone]  Clinic Follow up Note   Patient Care Team: Eulas Post, MD as PCP - General (Family Medicine) Arta Silence, MD as Consulting Physician (Gastroenterology) Michael Boston, MD as Consulting Physician (General Surgery) Kyung Rudd, MD as Consulting Physician (Radiation Oncology) 08/06/2017  SUMMARY OF ONCOLOGIC HISTORY: Oncology History   Cancer Staging Rectal adenocarcinoma Paul [name]) Staging form: Colon and Rectum, AJCC 8th Edition - Clinical stage from 07/06/2017: Stage IIIB (cT3, cN1, cM0) - Signed by [name], NP on 07/10/2017      Rectal adenocarcinoma ([place])   07/03/2017 Imaging    CT ABD PELVIS W CONTRAST IMPRESSION: Possible mass in the rectum compatible carcinoma. Endoscopy recommended for further evaluation. Otherwise negative.      07/03/2017 Tumor Marker    CEA 2.7      07/05/2017 Initial Biopsy    Rectum biopsy -invasive adenocarcinoma, poorly differentiated      07/05/2017 Procedure    Colonoscopy Per Dr. Paulita Fujita Findings: The perianal and digital rectal examinations were normal.  Internal hemorrhoids were found during retroflexion. The hemorrhoids were moderate. No additional abnormalities were found on retroflexion.  A fungating, sessile and ulcerated partially obstructing large mass was found in the recto-sigmoid colon.The mass was partially circumferential (involving two-thirds of the lumen circumference). Oozing was present. Area was successfully injected with 2 mL [place] ink for tattooing. This was biopsied with a cold forceps for histology.      07/05/2017 Initial Diagnosis    Rectal adenocarcinoma ([place])      07/06/2017 Imaging    Staging MRI ABD/PELVIS revealed clinical stage T3N1M0, IIIb       07/18/2017 -  Radiation Therapy    The patient began radiation therapy on 07/18/17 supervised by Dr Lisbeth Renshaw.       07/18/2017 Imaging    CT CHEST  IMPRESSION: Negative. No  evidence of thoracic metastatic disease or other significant abnormality.       07/18/2017 -  Chemotherapy    Xeloda 2000mg  am and 1500mg  pm every 12 hours, with concurrent radiation       CURRENT THERAPY: Xeloda 2000mg  am and 1500mg  pm every 12 hours, with concurrent radiation, started on 07/18/2017  INTERVAL HISTORY: Mr. Scalera today returns for follow-up as scheduled. He is ending the second week of concurrent chemoradiation with Xeloda, 2000 mg in the morning and 1500 mg in the evening Monday through Friday. He had some difficulty with nausea and vomiting during the first week, this has now improved with alternating Zofran and Compazine. He took MiraLAX for constipation during his first week on treatment but has since stopped taking this, his bowels are moving regularly now. He infrequently notes small amount of blood streaks in stool. He has mild fatigue towards end of the treatment week but continues to work full time. He is asking about FMLA papers. He denies rectal pain, skin breakdown, palmar-plantar pain or sensitivity, mouth sores, fever, or decreased appetite.   REVIEW OF SYSTEMS:   Constitutional: Denies fevers, chills or abnormal weight loss (+) mild fatigue over baseline Eyes: Denies blurriness of vision Ears, nose, mouth, throat, and face: Denies mucositis or sore throat Respiratory: Denies cough, dyspnea or wheezes Cardiovascular: Denies palpitation, chest discomfort or lower extremity swelling Gastrointestinal:  Denies vomiting, constipation, diarrhea, heartburn or change in bowel habits (+) mild nausea, controlled with zofran and compazine (+) infrequent small amount of blood streaks in stool Skin: Denies abnormal skin rashes, itching, peeling, blisters, cracks, redness, pain,  sensitivity, or breakdown (+) dry skin) Lymphatics: Denies new lymphadenopathy or easy bruising Neurological:Denies numbness, tingling or new weaknesses Behavioral/Psych: Mood is stable, no new  changes  All other systems were reviewed with the patient and are negative.  MEDICAL HISTORY:  Past Medical History:  Diagnosis Date  . Allergy    grass, dander  . Colon cancer ([place]) 07/05/2017   rectal adenocarcinoma  . GERD (gastroesophageal reflux disease)   . History of meningitis   . Hypertension     SURGICAL HISTORY: Past Surgical History:  Procedure Laterality Date  . COLONOSCOPY WITH PROPOFOL Left 07/05/2017   Procedure: COLONOSCOPY WITH PROPOFOL;  Surgeon: Arta Silence, MD;  Location: [facility];  Service: Endoscopy;  Laterality: Left;    I have reviewed the social history and family history with the patient and they are unchanged from previous note.  ALLERGIES:  is allergic to chloroquine.  MEDICATIONS:  Current Outpatient Prescriptions  Medication Sig Dispense Refill  . amLODipine (NORVASC) 5 MG tablet TAKE 1 TABLET DAILY 90 tablet 1  . calcium carbonate (OS-CAL - DOSED IN MG OF ELEMENTAL CALCIUM) 1250 (500 Ca) MG tablet Take 1 tablet by mouth daily as needed (for bones).    . capecitabine (XELODA) 500 MG tablet Take 4 tabs (2000mg ) in AM & 3 tabs (1500mg ) in PM, within 26min of meals, days of radiation only, M-F 210 tablet 0  . cetirizine (ZYRTEC) 10 MG tablet Take 10 mg by mouth daily as needed for allergies.    . Garlic 2 MG CAPS Take 2 mg by mouth daily.    . Multiple Vitamin (MULTIVITAMIN WITH MINERALS) TABS tablet Take 1 tablet by mouth daily.    Marland Kitchen omeprazole (PRILOSEC) 40 MG capsule Take 40 mg by mouth daily as needed (heart burn).     . ondansetron (ZOFRAN) 8 MG tablet Take 1 tablet (8 mg total) by mouth every 8 (eight) hours as needed for nausea or vomiting. 30 tablet 3  . prochlorperazine (COMPAZINE) 10 MG tablet Take 1 tablet (10 mg total) by mouth every 6 (six) hours as needed for nausea or vomiting. 30 tablet 2   No current facility-administered medications for this visit.     PHYSICAL EXAMINATION: ECOG PERFORMANCE STATUS: 1 - Symptomatic but  completely ambulatory  Vitals:   08/06/17 0959  BP: (!) 134/94  Pulse: 63  Resp: 18  Temp: 98.1 F (36.7 C)  SpO2: 100%   Filed Weights   08/06/17 0959  Weight: 195 lb 1.6 oz (88.5 kg)    GENERAL:alert, no distress and comfortable SKIN: skin color, texture, turgor are normal, no rashes or significant lesions. Palms are dry and intact, no erythema or cracks. Hyperpigmentation noted to palmar surfaces bilaterally. No erythema, hyperpigmentation, or skin breakdown to pelvic area. EYES: normal, Conjunctiva are pink and non-injected, sclera clear OROPHARYNX:no exudate, no erythema and lips, buccal mucosa, and tongue normal  NECK: supple, thyroid normal size, non-tender, without nodularity LYMPH:  no palpable cervical, supraclavicular, axillary, or inguinal lymphadenopathy LUNGS: clear to auscultation bilaterally with normal breathing effort HEART: regular rate & rhythm, S1 and S2 present, no murmurs and no lower extremity edema ABDOMEN:abdomen soft, non-tender and normal bowel sounds. No palpable hepatomegaly or masses Musculoskeletal:no cyanosis of digits and no clubbing  NEURO: alert & oriented x 3 with fluent speech, no focal motor/sensory deficits  LABORATORY DATA:  I have reviewed the data as listed CBC Latest Ref Rng & Units 08/06/2017 07/30/2017 07/23/2017  WBC 4.0 - 10.3 10e3/uL 2.3(L) 2.3(L) 3.2(L)  Hemoglobin 13.0 - 17.1 g/dL 13.2 12.5(L) 13.7  Hematocrit 38.4 - 49.9 % 39.6 37.7(L) 40.8  Platelets 140 - 400 10e3/uL 144 174 222     CMP Latest Ref Rng & Units 08/06/2017 07/30/2017 07/23/2017  Glucose 70 - 140 mg/dl 113 93 103  BUN 7.0 - 26.0 mg/dL 10.9 13.2 12.5  Creatinine 0.7 - 1.3 mg/dL 0.9 0.8 0.9  Sodium 136 - 145 mEq/L 141 141 141  Potassium 3.5 - 5.1 mEq/L 4.0 4.3 4.0  Chloride 101 - 111 mmol/L - - -  CO2 22 - 29 mEq/L 28 30(H) 31(H)  Calcium 8.4 - 10.4 mg/dL 9.3 9.4 9.6  Total Protein 6.4 - 8.3 g/dL 7.1 6.9 7.4  Total Bilirubin 0.20 - 1.20 mg/dL 0.34 0.26 0.45    Alkaline Phos 40 - 150 U/L 75 72 78  AST 5 - 34 U/L 45(H) 31 24  ALT 0 - 55 U/L 53 35 25    RADIOGRAPHIC STUDIES: I have personally reviewed the radiological images as listed and agreed with the findings in the report. No results found.   ASSESSMENT & PLAN:  Jawon Dipiero is a 51 y.o.  with history of allergies, HTN, GERD, and chronic low back pain. He presents with rectal cancer diagnosed 07/05/2017.  1. Poorly differentiated invasive rectal adenocarcinoma, cT3N1M0, stage IIIb 2. Genetics 3. Nausea and constipation 4. Neutropenia, secondary to chemotherapy  Mr. Mayol appears stable today, he seems to be tolerating concurrent chemoradiation with Xeloda well overall. His BP is elevated today, 134/94, however he did not take his amlodipine today. He will take it when he gets home today and continue daily as prescribed. He is able to function normally and work full time with mild fatigue. His nausea is improved with PRN zofran and compazine, I reviewed dosage instructions and refilled compazine today. His skin is dry with palmar hyperpigmentation, no breakdown, he will continue topical cream to hands, feet, and radiation target area. Labs reviewed, [place] has normalized to 1.5, CBC and Cmet otherwise unremarkable, physical exam is WNL. Per Dr. Ernestina Penna previous note, if [place] drops below 0.5 we will need to hold chemo/radiation; will need to dose reduce Xeloda if ANC below 1.0.  He will continue concurrent chemoradiation with Xeloda 2000 mg AM and 1500 PM Monday through Friday at current dose. He will see genetic counselor on 10/17 and back to see me on 10/22.   PLAN:  -take amlodipine today and continue daily as instructed -continue PRN anti-emetics, Compazine refilled today -continue topical skin cream  -Continue concurrent chemoradiation with Xeloda 4 tablets AM (2000 mg) and 3 tablets PM (1500 mg) Monday through Friday only -lab and f/u in 1 week  -genetic counselor apt on 10/17  All  questions were answered. The patient knows to call the clinic with any problems, questions or concerns. No barriers to learning was detected. I spent 25 counseling the patient face to face. The total time spent in the appointment was 30 and more than 50% was on counseling and review of test results     [name], NP 08/06/17

## 2017-08-07 ENCOUNTER — Telehealth: Payer: Self-pay | Admitting: Pharmacist

## 2017-08-07 ENCOUNTER — Ambulatory Visit
Admission: RE | Admit: 2017-08-07 | Discharge: 2017-08-07 | Disposition: A | Discharge status: Home / Self Care | Payer: BLUE CROSS/BLUE SHIELD | Source: Ambulatory Visit | Attending: Radiation Oncology | Admitting: Radiation Oncology

## 2017-08-07 DIAGNOSIS — C20 Malignant neoplasm of rectum: Secondary | ICD-10-CM

## 2017-08-07 DIAGNOSIS — Z510 Encounter for antineoplastic radiation therapy: Secondary | ICD-10-CM | POA: Diagnosis not present

## 2017-08-07 MED ORDER — CAPECITABINE 500 MG PO TABS: ORAL_TABLET | ORAL | 0 refills | Status: DC

## 2017-08-07 NOTE — Telephone Encounter (Signed)
Oral Oncology Pharmacist Encounter  Received notification from [facility] that the remaining 56 tablets of patient's Xeloda prescription would need to be filled through [facility] (ph: [phone]) per insurance requirement.  Prescription e-scribed this morning. I will follow-up later today with Pharmacy to ensure Rx is moving.  Patient states he has enough Xeloda tablets on hand for the rest of this week but will need the rest of his tablets by Monday am 10/22.  Oral Oncology Clinic will continue to follow.  Johny Drilling, PharmD, BCPS, BCOP 08/07/2017 8:48 AM Oral Oncology Clinic [phone]

## 2017-08-07 NOTE — Telephone Encounter (Signed)
Oral Oncology Pharmacist Encounter  I called [facility] (ph: [phone]) to follow-up on patient's Xeloda prescription e-scribed this morning.  Prescription verified by pharmacist over the phone.  Copayment $0. The pharmacy will reach out to patient by tomorrow (08/08/17) to schedule overnight delivery.  Patient called and updated. I provided patient with pharmacy phone number and instructions to call them by tomorrow afternoon if they had not heard from them.  Patient knows to call the office with questions concerns.  Oral Oncology Clinic will continue to follow.  Johny Drilling, PharmD, BCPS, BCOP 08/07/2017 4:38 PM Oral Oncology Clinic [phone]

## 2017-08-08 ENCOUNTER — Encounter: Payer: Self-pay | Admitting: Genetic Counselor

## 2017-08-08 ENCOUNTER — Other Ambulatory Visit: Payer: BLUE CROSS/BLUE SHIELD

## 2017-08-08 ENCOUNTER — Ambulatory Visit
Admission: RE | Admit: 2017-08-08 | Discharge: 2017-08-08 | Disposition: A | Discharge status: Home / Self Care | Payer: BLUE CROSS/BLUE SHIELD | Source: Ambulatory Visit | Attending: Radiation Oncology | Admitting: Radiation Oncology

## 2017-08-08 ENCOUNTER — Ambulatory Visit (HOSPITAL_BASED_OUTPATIENT_CLINIC_OR_DEPARTMENT_OTHER): Payer: BLUE CROSS/BLUE SHIELD | Admitting: Genetic Counselor

## 2017-08-08 DIAGNOSIS — Z510 Encounter for antineoplastic radiation therapy: Secondary | ICD-10-CM | POA: Diagnosis not present

## 2017-08-08 DIAGNOSIS — Z7183 Encounter for nonprocreative genetic counseling: Secondary | ICD-10-CM | POA: Diagnosis not present

## 2017-08-08 DIAGNOSIS — C20 Malignant neoplasm of rectum: Secondary | ICD-10-CM

## 2017-08-08 DIAGNOSIS — Z8042 Family history of malignant neoplasm of prostate: Secondary | ICD-10-CM

## 2017-08-08 NOTE — Progress Notes (Signed)
REFERRING PROVIDER: Feng, Yan, MD [address] [place], [place] [address]  PRIMARY PROVIDER:  Burchette, Bruce W, MD  PRIMARY REASON FOR VISIT:  1. Rectal adenocarcinoma (HCC)   2. Family history of prostate cancer      HISTORY OF PRESENT ILLNESS:   Paul [name], a 51 y.o. male, was seen for a [place] cancer genetics consultation at the request of Dr. Feng due to a personal and family history of cancer.  Paul [name] presents to clinic today to discuss the possibility of a hereditary predisposition to cancer, genetic testing, and to further clarify his future cancer risks, as well as potential cancer risks for family members.   In 2018, at the age of 51, Paul [name] was diagnosed with rectal cancer. This was treated with chemotherapy, radiation and surgery.      CANCER HISTORY:  Oncology History   Cancer Staging Rectal adenocarcinoma ([facility]) Staging form: Colon and Rectum, AJCC 8th Edition - Clinical stage from 07/06/2017: Stage IIIB (cT3, cN1, cM0) - Signed by Burton, Lacie K, NP on 07/10/2017      Rectal adenocarcinoma ([facility])   07/03/2017 Imaging    CT ABD PELVIS W CONTRAST IMPRESSION: Possible mass in the rectum compatible carcinoma. Endoscopy recommended for further evaluation. Otherwise negative.      07/03/2017 Tumor Marker    CEA 2.7      07/05/2017 Initial Biopsy    Rectum biopsy -invasive adenocarcinoma, poorly differentiated      07/05/2017 Procedure    Colonoscopy Per Dr. Outlaw Findings: The perianal and digital rectal examinations were normal.  Internal hemorrhoids were found during retroflexion. The hemorrhoids were moderate. No additional abnormalities were found on retroflexion.  A fungating, sessile and ulcerated partially obstructing large mass was found in the recto-sigmoid colon.The mass was partially circumferential (involving two-thirds of the lumen circumference). Oozing was present. Area was successfully injected with 2 mL [place] ink for  tattooing. This was biopsied with a cold forceps for histology.      07/05/2017 Initial Diagnosis    Rectal adenocarcinoma (HCC)      07/06/2017 Imaging    Staging MRI ABD/PELVIS revealed clinical stage T3N1M0, IIIb       07/18/2017 -  Radiation Therapy    The patient began radiation therapy on 07/18/17 supervised by Dr Moody.       07/18/2017 Imaging    CT CHEST  IMPRESSION: Negative. No evidence of thoracic metastatic disease or other significant abnormality.       07/18/2017 -  Chemotherapy    Xeloda 2000mg am and 1500mg pm every 12 hours, with concurrent radiation           Past Medical History:  Diagnosis Date  . Allergy    grass, dander  . Colon cancer ([facility]) 07/05/2017   rectal adenocarcinoma  . Family history of prostate cancer   . GERD (gastroesophageal reflux disease)   . History of meningitis   . Hypertension     Past Surgical History:  Procedure Laterality Date  . COLONOSCOPY WITH PROPOFOL Left 07/05/2017   Procedure: COLONOSCOPY WITH PROPOFOL;  Surgeon: Outlaw, William, MD;  Location: [facility];  Service: Endoscopy;  Laterality: Left;    Social History   Social History  . Marital status: Married    Spouse name: N/A  . Number of children: N/A  . Years of education: N/A   Social History Main Topics  . Smoking status: Never Smoker  . Smokeless tobacco: Never Used  . Alcohol use No  .   Drug use: No  . Sexual activity: Not Asked   Other Topics Concern  . None   Social History Narrative   Originally from [place]     FAMILY HISTORY:  We obtained a detailed, 4-generation family history.  Significant diagnoses are listed below: Family History  Problem Relation Age of Onset  . Hypertension Mother   . Asthma Father   . Cancer Father        Prostate  . Asthma Son   . Asthma Son   . Asthma Daughter   . Dementia Maternal Grandmother     The patient has two sons and a daughter who are cancer free.   He has 18 full and half  siblings, none who have cancer that he is aware of.  The patient's father was diagnosed with prostate cancer at 76 and died of prostate cancer at 78.  His mother is alive at 70.  She has not had cancer.  There is no other reported family history of cancer.  Paul [name] is unaware of previous family history of genetic testing for hereditary cancer risks. Patient's maternal ancestors are of [place] descent, and paternal ancestors are of [place] descent. There is no reported Ashkenazi Jewish ancestry. There is no known consanguinity.  GENETIC COUNSELING ASSESSMENT: Paul [name] is a 51 y.o. male with a personal history of rectal cancer and family history of prostate cancer which is somewhat suggestive of a hereditary cancer syndrome and predisposition to cancer. We, therefore, discussed and recommended the following at today's visit.   DISCUSSION: We discussed that about 5-6% of rectal cancer is hereditary with most cases due to Lynch syndrome.  We discussed that Lynch syndrome is associated with several cancers that run together in families, including colon cancer, other GI cancers such as stomach and pancreatic, as well as GYN cancers and sometimes prostate cancer.  There are other genes that are associated with hereditary colon cancer that can sometimes be associated with early onset cancer.  We reviewed the characteristics, features and inheritance patterns of hereditary cancer syndromes. We also discussed genetic testing, including the appropriate family members to test, the process of testing, insurance coverage and turn-around-time for results. We discussed the implications of a negative, positive and/or variant of uncertain significant result. We recommended Paul [name] pursue genetic testing for the common hereditary cancer syndrome gene panel. The Hereditary Gene Panel offered by [facility] includes sequencing and/or deletion duplication testing of the following 47 genes: APC, ATM, AXIN2, BARD1,  BMPR1A, BRCA1, BRCA2, BRIP1, CDH1, CDK4, CDKN2A (p14ARF), CDKN2A (p16INK4a), CHEK2, CTNNA1, DICER1, EPCAM (Deletion/duplication testing only), GREM1 (promoter region deletion/duplication testing only), KIT, MEN1, MLH1, MSH2, MSH3, MSH6, MUTYH, NBN, NF1, NHTL1, PALB2, PDGFRA, PMS2, POLD1, POLE, PTEN, RAD50, RAD51C, RAD51D, SDHB, SDHC, SDHD, SMAD4, SMARCA4. STK11, TP53, TSC1, TSC2, and VHL.  The following genes were evaluated for sequence changes only: SDHA and HOXB13 c.251G>A variant only.   Based on Paul [name]'s personal and family history of cancer, he meets medical criteria for genetic testing. Despite that he meets criteria, he may still have an out of pocket cost. We discussed that if his out of pocket cost for testing is over $100, the laboratory will call and confirm whether he wants to proceed with testing.  If the out of pocket cost of testing is less than $100 he will be billed by the genetic testing laboratory.   PLAN: After considering the risks, benefits, and limitations, Paul [name]  provided informed consent to pursue genetic testing and   the blood sample was sent to [facility] for analysis of the common hereditary cancer syndrome. Results should be available within approximately 2-3 weeks' time, at which point they will be disclosed by telephone to Paul [name], as will any additional recommendations warranted by these results. Paul [name] will receive a summary of his genetic counseling visit and a copy of his results once available. This information will also be available in Epic. We encouraged Paul [name] to remain in contact with cancer genetics annually so that we can continuously update the family history and inform him of any changes in cancer genetics and testing that may be of benefit for his family. Paul [name] questions were answered to his satisfaction today. Our contact information was provided should additional questions or concerns arise.  Lastly, we encouraged [name] to remain in contact with cancer genetics annually so that we can continuously update the family history and inform him of any changes in cancer genetics and testing that may be of benefit for this family.   [name] questions were answered to his satisfaction today. Our contact information was provided should additional questions or concerns arise. Thank you for the referral and allowing [place] to share in the care of your patient.   Ashaki Frosch P. Florene Glen, [place], [facility] Certified Genetic Counselor [place] [email or web address] phone: [phone]  The patient was seen for a total of 35 minutes in face-to-face genetic counseling.  This patient was discussed with Drs. Magrinat, Lindi Adie and/or Burr Medico who agrees with the above.    _______________________________________________________________________ For Office Staff:  Number of people involved in session: 1 Was an Intern/ student involved with case: no

## 2017-08-09 ENCOUNTER — Ambulatory Visit
Admission: RE | Admit: 2017-08-09 | Discharge: 2017-08-09 | Disposition: A | Discharge status: Home / Self Care | Payer: BLUE CROSS/BLUE SHIELD | Source: Ambulatory Visit | Attending: Radiation Oncology | Admitting: Radiation Oncology

## 2017-08-09 ENCOUNTER — Encounter: Payer: Self-pay | Admitting: Family Medicine

## 2017-08-09 ENCOUNTER — Telehealth: Payer: Self-pay | Admitting: Nurse Practitioner

## 2017-08-09 DIAGNOSIS — Z510 Encounter for antineoplastic radiation therapy: Secondary | ICD-10-CM | POA: Diagnosis not present

## 2017-08-09 NOTE — Telephone Encounter (Signed)
Patient had a conflict with previous time so he wanted to reschedule

## 2017-08-10 ENCOUNTER — Ambulatory Visit
Admission: RE | Admit: 2017-08-10 | Discharge: 2017-08-10 | Disposition: A | Discharge status: Home / Self Care | Payer: BLUE CROSS/BLUE SHIELD | Source: Ambulatory Visit | Attending: Radiation Oncology | Admitting: Radiation Oncology

## 2017-08-10 ENCOUNTER — Ambulatory Visit: Payer: Self-pay | Admitting: Surgery

## 2017-08-10 DIAGNOSIS — Z510 Encounter for antineoplastic radiation therapy: Secondary | ICD-10-CM | POA: Diagnosis not present

## 2017-08-13 ENCOUNTER — Telehealth: Payer: Self-pay | Admitting: Nurse Practitioner

## 2017-08-13 ENCOUNTER — Ambulatory Visit (HOSPITAL_BASED_OUTPATIENT_CLINIC_OR_DEPARTMENT_OTHER): Payer: BLUE CROSS/BLUE SHIELD | Admitting: Nurse Practitioner

## 2017-08-13 ENCOUNTER — Other Ambulatory Visit (HOSPITAL_BASED_OUTPATIENT_CLINIC_OR_DEPARTMENT_OTHER): Payer: BLUE CROSS/BLUE SHIELD

## 2017-08-13 ENCOUNTER — Ambulatory Visit: Payer: BLUE CROSS/BLUE SHIELD | Admitting: Nurse Practitioner

## 2017-08-13 ENCOUNTER — Ambulatory Visit
Admission: RE | Admit: 2017-08-13 | Discharge: 2017-08-13 | Disposition: A | Discharge status: Home / Self Care | Payer: BLUE CROSS/BLUE SHIELD | Source: Ambulatory Visit | Attending: Radiation Oncology | Admitting: Radiation Oncology

## 2017-08-13 ENCOUNTER — Encounter: Payer: Self-pay | Admitting: Nurse Practitioner

## 2017-08-13 VITALS — BP 122/85 | HR 68 | Temp 98.2°F | Resp 18 | Ht 73.0 in | Wt 192.4 lb

## 2017-08-13 DIAGNOSIS — C20 Malignant neoplasm of rectum: Secondary | ICD-10-CM

## 2017-08-13 DIAGNOSIS — Z510 Encounter for antineoplastic radiation therapy: Secondary | ICD-10-CM | POA: Diagnosis not present

## 2017-08-13 DIAGNOSIS — D701 Agranulocytosis secondary to cancer chemotherapy: Secondary | ICD-10-CM | POA: Diagnosis not present

## 2017-08-13 LAB — COMPREHENSIVE METABOLIC PANEL
ALBUMIN: 4.1 g/dL (ref 3.5–5.0)
ALK PHOS: 72 U/L (ref 40–150)
ALT: 40 U/L (ref 0–55)
AST: 36 U/L — ABNORMAL HIGH (ref 5–34)
Anion Gap: 7 mEq/L (ref 3–11)
BILIRUBIN TOTAL: 0.49 mg/dL (ref 0.20–1.20)
BUN: 13 mg/dL (ref 7.0–26.0)
CO2: 28 meq/L (ref 22–29)
CREATININE: 0.9 mg/dL (ref 0.7–1.3)
Calcium: 9.4 mg/dL (ref 8.4–10.4)
Chloride: 104 mEq/L (ref 98–109)
EGFR: >60 mL/min/{1.73_m2} (ref >60)
GLUCOSE: 114 mg/dL (ref 70–140)
Potassium: 4.5 mEq/L (ref 3.5–5.1)
SODIUM: 140 meq/L (ref 136–145)
TOTAL PROTEIN: 7.3 g/dL (ref 6.4–8.3)

## 2017-08-13 LAB — CBC WITH DIFFERENTIAL/PLATELET
BASO%: 0.4 % (ref 0.0–2.0)
Basophils Absolute: 0 10*3/uL (ref 0.0–0.1)
EOS ABS: 0.2 10*3/uL (ref 0.0–0.5)
EOS%: 6.7 % (ref 0.0–7.0)
HCT: 38.3 % — ABNORMAL LOW (ref 38.4–49.9)
HEMOGLOBIN: 12.8 g/dL — AB (ref 13.0–17.1)
LYMPH%: 8.9 % — AB (ref 14.0–49.0)
MCH: 29.2 pg (ref 27.2–33.4)
MCHC: 33.4 g/dL (ref 32.0–36.0)
MCV: 87.4 fL (ref 79.3–98.0)
MONO#: 0.5 10*3/uL (ref 0.1–0.9)
MONO%: 17.7 % — AB (ref 0.0–14.0)
NEUT%: 66.3 % (ref 39.0–75.0)
NEUTROS ABS: 1.9 10*3/uL (ref 1.5–6.5)
PLATELETS: 141 10*3/uL (ref 140–400)
RBC: 4.38 10*6/uL (ref 4.20–5.82)
RDW: 15 % — AB (ref 11.0–14.6)
WBC: 2.8 10*3/uL — AB (ref 4.0–10.3)
lymph#: 0.3 10*3/uL — ABNORMAL LOW (ref 0.9–3.3)

## 2017-08-13 NOTE — Progress Notes (Signed)
[place]  [phone] [phone]  Clinic Follow up Note   Patient Care Team: Eulas Post, MD as PCP - General (Family Medicine) Arta Silence, MD as Consulting Physician (Gastroenterology) Michael Boston, MD as Consulting Physician (General Surgery) Kyung Rudd, MD as Consulting Physician (Radiation Oncology) 08/13/2017  SUMMARY OF ONCOLOGIC HISTORY: Oncology History   Cancer Staging Rectal adenocarcinoma [facility]) Staging form: Colon and Rectum, AJCC 8th Edition - Clinical stage from 07/06/2017: Stage IIIB (cT3, cN1, cM0) - Signed by [name], NP on 07/10/2017      Rectal adenocarcinoma ([place])   07/03/2017 Imaging    CT ABD PELVIS W CONTRAST IMPRESSION: Possible mass in the rectum compatible carcinoma. Endoscopy recommended for further evaluation. Otherwise negative.      07/03/2017 Tumor Marker    CEA 2.7      07/05/2017 Initial Biopsy    Rectum biopsy -invasive adenocarcinoma, poorly differentiated      07/05/2017 Procedure    Colonoscopy Per Dr. Paulita Fujita Findings: The perianal and digital rectal examinations were normal.  Internal hemorrhoids were found during retroflexion. The hemorrhoids were moderate. No additional abnormalities were found on retroflexion.  A fungating, sessile and ulcerated partially obstructing large mass was found in the recto-sigmoid colon.The mass was partially circumferential (involving two-thirds of the lumen circumference). Oozing was present. Area was successfully injected with 2 mL [place] ink for tattooing. This was biopsied with a cold forceps for histology.      07/05/2017 Initial Diagnosis    Rectal adenocarcinoma ([place])      07/06/2017 Imaging    Staging MRI ABD/PELVIS revealed clinical stage T3N1M0, IIIb       07/18/2017 -  Radiation Therapy    The patient began radiation therapy on 07/18/17 supervised by Dr Lisbeth Renshaw.       07/18/2017 Imaging    CT CHEST  IMPRESSION: Negative. No  evidence of thoracic metastatic disease or other significant abnormality.       07/18/2017 -  Chemotherapy    Xeloda 2000mg  am and 1500mg  pm every 12 hours, with concurrent radiation      CURRENT THERAPY: Xeloda 2000mg  am and 1500mg  pm every 12 hours, with concurrent radiation, started on 07/18/2017  INTERVAL HISTORY: [name] returns today for follow-up as scheduled.he continues concurrent chemoradiation with Xeloda, 2000 mg in the morning and 1500 mg in the evening Monday through Friday. He has had bright red blood in stool and per rectum 1 week. He has 3-4 episodes of stool per day, large amount blood noted with each episode in toilet, with wiping, and mixed in stool. He has rectal pain worse with BM and intermittent abdominal cramping. He does not use Imodium, his stool is formed. Manages mild nausea with Zofran when necessary. Palms and feet are dry with hyperpigmentation, uses Eucerin lotion intermittently; denies pain, sensitivity, blisters, numbness or tingling. He is eating and drinking well, denies fatigue, cough, chest pain, shortness of breath, lower extremity edema, fever, chills. He is concerned about FMLA paperwork, it was submitted after the deadline and he isn't sure about the stability of his job.  REVIEW OF SYSTEMS:   Constitutional: Denies fatigue, fevers, chills (+) good appetite (+) weight stable overall Eyes: Denies blurriness of vision Ears, nose, mouth, throat, and face: Denies mucositis or sore throat Respiratory: Denies cough, dyspnea or wheezes Cardiovascular: Denies palpitation, chest discomfort or lower extremity swelling Gastrointestinal:  Denies constipation, diarrhea, vomiting, heartburn or change in bowel habits (+) hematochezia (+) intermittent mild nausea Skin:  Denies abnormal skin rashes (+) dry, hyperpigmentation to hands and feet  Lymphatics: Denies new lymphadenopathy or easy bruising Neurological:Denies numbness, tingling or new weaknesses. Denies  lightheadedness or dizziness.  Behavioral/Psych: Mood is stable, no new changes  All other systems were reviewed with the patient and are negative.  MEDICAL HISTORY:  Past Medical History:  Diagnosis Date  . Allergy    grass, dander  . Colon cancer ([place]) 07/05/2017   rectal adenocarcinoma  . Family history of prostate cancer   . GERD (gastroesophageal reflux disease)   . History of meningitis   . Hypertension     SURGICAL HISTORY: Past Surgical History:  Procedure Laterality Date  . COLONOSCOPY WITH PROPOFOL Left 07/05/2017   Procedure: COLONOSCOPY WITH PROPOFOL;  Surgeon: Arta Silence, MD;  Location: [facility];  Service: Endoscopy;  Laterality: Left;    I have reviewed the social history and family history with the patient and they are unchanged from previous note.  ALLERGIES:  is allergic to chloroquine.  MEDICATIONS:  Current Outpatient Prescriptions  Medication Sig Dispense Refill  . amLODipine (NORVASC) 5 MG tablet TAKE 1 TABLET DAILY 90 tablet 1  . calcium carbonate (OS-CAL - DOSED IN MG OF ELEMENTAL CALCIUM) 1250 (500 Ca) MG tablet Take 1 tablet by mouth daily as needed (for bones).    . capecitabine (XELODA) 500 MG tablet Take 4 tabs (2000mg ) in AM & 3 tabs (1500mg ) in PM, within 12min of meals, days of radiation only, M-F 56 tablet 0  . cetirizine (ZYRTEC) 10 MG tablet Take 10 mg by mouth daily as needed for allergies.    . Garlic 2 MG CAPS Take 2 mg by mouth daily.    . Multiple Vitamin (MULTIVITAMIN WITH MINERALS) TABS tablet Take 1 tablet by mouth daily.    Marland Kitchen omeprazole (PRILOSEC) 40 MG capsule Take 40 mg by mouth daily as needed (heart burn).     . ondansetron (ZOFRAN) 8 MG tablet Take 1 tablet (8 mg total) by mouth every 8 (eight) hours as needed for nausea or vomiting. 30 tablet 3  . prochlorperazine (COMPAZINE) 10 MG tablet Take 1 tablet (10 mg total) by mouth every 6 (six) hours as needed for nausea or vomiting. (Patient not taking: Reported on 08/13/2017)  30 tablet 2   No current facility-administered medications for this visit.     PHYSICAL EXAMINATION: ECOG PERFORMANCE STATUS: 1 - Symptomatic but completely ambulatory  Vitals:   08/13/17 0911  BP: 122/85  Pulse: 68  Resp: 18  Temp: 98.2 F (36.8 C)  SpO2: 100%   Filed Weights   08/13/17 0911  Weight: 192 lb 6.4 oz (87.3 kg)    GENERAL:alert, no distress and comfortable SKIN: skin color, texture, turgor are normal, no rashes or significant lesions. Palmar - plantar hyperpigmentation with dryness; no erythema, blisters, peeling, or breakdown. EYES: normal, Conjunctiva are pink and non-injected, sclera clear OROPHARYNX:no exudate, no erythema and lips, buccal mucosa, and tongue normal  NECK: supple, thyroid normal size, non-tender, without nodularity LYMPH:  no palpable cervical or supraclavicular lymphadenopathy  LUNGS: clear to auscultation bilaterally with normal breathing effort HEART: regular rate & rhythm  no murmurs and no lower extremity edema ABDOMEN:abdomen soft, non-tender and normal bowel sounds. No palpable hepatomegaly or masses. No obvious skin changes to radiation site. Musculoskeletal:no cyanosis of digits and no clubbing  NEURO: alert & oriented x 3 with fluent speech, no focal motor/sensory deficits Rectal: external exam reveals no abnormalities, skin is intact, normal color. Digital rectal exam  deferred  LABORATORY DATA:  I have reviewed the data as listed CBC Latest Ref Rng & Units 08/13/2017 08/06/2017 07/30/2017  WBC 4.0 - 10.3 10e3/uL 2.8(L) 2.3(L) 2.3(L)  Hemoglobin 13.0 - 17.1 g/dL 12.8(L) 13.2 12.5(L)  Hematocrit 38.4 - 49.9 % 38.3(L) 39.6 37.7(L)  Platelets 140 - 400 10e3/uL 141 144 174     CMP Latest Ref Rng & Units 08/13/2017 08/06/2017 07/30/2017  Glucose 70 - 140 mg/dl 114 113 93  BUN 7.0 - 26.0 mg/dL 13.0 10.9 13.2  Creatinine 0.7 - 1.3 mg/dL 0.9 0.9 0.8  Sodium 136 - 145 mEq/L 140 141 141  Potassium 3.5 - 5.1 mEq/L 4.5 4.0 4.3  Chloride  101 - 111 mmol/L - - -  CO2 22 - 29 mEq/L 28 28 30(H)  Calcium 8.4 - 10.4 mg/dL 9.4 9.3 9.4  Total Protein 6.4 - 8.3 g/dL 7.3 7.1 6.9  Total Bilirubin 0.20 - 1.20 mg/dL 0.49 0.34 0.26  Alkaline Phos 40 - 150 U/L 72 75 72  AST 5 - 34 U/L 36(H) 45(H) 31  ALT 0 - 55 U/L 40 53 35    RADIOGRAPHIC STUDIES: I have personally reviewed the radiological images as listed and agreed with the findings in the report. No results found.   ASSESSMENT & PLAN: Paul [name] is a 52 y.o.with history of allergies, HTN, GERD, and chronic low back pain. He presents with rectal cancer diagnosed 07/05/2017.  1. Poorly differentiated invasive rectal adenocarcinoma, cT3N1M0, stage IIIb 2. Genetics 3. Nausea and constipation 4. Neutropenia, secondary to chemotherapy 5. Rectal bleeding 6. Mild transaminitis  Mr. Curt appears stable today. Vital signs and weight stable overall.  Mild nausea is well-managed with PRN Zofran. We discussed rectal bleeding is likely related to radiation side effects and tumor bleeding. I discussed this with Dr. Benay Spice; he will continue to monitor amount of rectal bleeding and call clinic if this increases. Mild AST elevation on Cmet likely secondary to xeloda is improved from last week. He is mildly leukopenic, secondary to chemotherapy, and anemic on CBC, secondary to chemotherapy and possibly GI blood loss; hgb 12.8. We'll continue to monitor closely. ANC is adequate to continue treatment. He does not require a Xeloda dose adjustment or treatment break at this time. If he becomes symptomatic from his anemia, experiences increased GI blood loss, increased fatigue, lightheadedness, dizziness, or dyspnea will add on lab appointment this week to check CBC. He has some hyperpigmentation and dryness to hands and feet, no redness. He will continue Eucerin and use hydrocortisone PRN. He'll proceed with concurrent chemoradiation with Xeloda 2000 mg in the morning and 1500 mg in the evening  Monday through Friday. Due to complete treatment on 11/2. He will return 11/5 for lab and f/u with Dr. Burr Medico.  PLAN:  -labs reviewed, mild leukopenia, anemia, and elevated AST will continue to monitor -continue concurrent chemoradiation with xeloda 2000 mg AM and 1500 mg PM Mon - Fri -call clinic with increased bleeding or new symptoms of anemia, can add on CBC check -return in 1 week for lab and f/u with Dr. Burr Medico -continue eucerin to hands and feet, hydrocortisone PRN  All questions were answered. The patient knows to call the clinic with any problems, questions or concerns. No barriers to learning was detected.     [name], NP 08/13/17

## 2017-08-13 NOTE — Telephone Encounter (Signed)
Per 10/22 no los 

## 2017-08-14 ENCOUNTER — Ambulatory Visit
Admission: RE | Admit: 2017-08-14 | Discharge: 2017-08-14 | Disposition: A | Discharge status: Home / Self Care | Payer: BLUE CROSS/BLUE SHIELD | Source: Ambulatory Visit | Attending: Radiation Oncology | Admitting: Radiation Oncology

## 2017-08-14 DIAGNOSIS — Z510 Encounter for antineoplastic radiation therapy: Secondary | ICD-10-CM | POA: Diagnosis not present

## 2017-08-15 ENCOUNTER — Ambulatory Visit
Admission: RE | Admit: 2017-08-15 | Discharge: 2017-08-15 | Disposition: A | Discharge status: Home / Self Care | Payer: BLUE CROSS/BLUE SHIELD | Source: Ambulatory Visit | Attending: Radiation Oncology | Admitting: Radiation Oncology

## 2017-08-15 DIAGNOSIS — Z510 Encounter for antineoplastic radiation therapy: Secondary | ICD-10-CM | POA: Diagnosis not present

## 2017-08-16 ENCOUNTER — Ambulatory Visit
Admission: RE | Admit: 2017-08-16 | Discharge: 2017-08-16 | Disposition: A | Discharge status: Home / Self Care | Payer: BLUE CROSS/BLUE SHIELD | Source: Ambulatory Visit | Attending: Radiation Oncology | Admitting: Radiation Oncology

## 2017-08-16 DIAGNOSIS — Z510 Encounter for antineoplastic radiation therapy: Secondary | ICD-10-CM | POA: Diagnosis not present

## 2017-08-17 ENCOUNTER — Ambulatory Visit
Admission: RE | Admit: 2017-08-17 | Discharge: 2017-08-17 | Disposition: A | Discharge status: Home / Self Care | Payer: BLUE CROSS/BLUE SHIELD | Source: Ambulatory Visit | Attending: Radiation Oncology | Admitting: Radiation Oncology

## 2017-08-17 DIAGNOSIS — Z510 Encounter for antineoplastic radiation therapy: Secondary | ICD-10-CM | POA: Diagnosis not present

## 2017-08-17 NOTE — Progress Notes (Signed)
[name]  [phone] [phone]  Clinic Follow Up Note   Patient Care Team: Eulas Post, MD as PCP - General (Family Medicine) Arta Silence, MD as Consulting Physician (Gastroenterology) Michael Boston, MD as Consulting Physician (General Surgery) Kyung Rudd, MD as Consulting Physician (Radiation Oncology) 08/20/2017   CHIEF COMPLAINTS:  F/u rectal cancer  Oncology History   Cancer Staging Rectal adenocarcinoma [facility]) Staging form: Colon and Rectum, AJCC 8th Edition - Clinical stage from 07/06/2017: Stage IIIB (cT3, cN1, cM0) - Signed by [name], NP on 07/10/2017      Rectal adenocarcinoma ([place])   07/03/2017 Imaging    CT ABD PELVIS W CONTRAST IMPRESSION: Possible mass in the rectum compatible carcinoma. Endoscopy recommended for further evaluation. Otherwise negative.      07/03/2017 Tumor Marker    CEA 2.7      07/05/2017 Initial Biopsy    Rectum biopsy -invasive adenocarcinoma, poorly differentiated      07/05/2017 Procedure    Colonoscopy Per Dr. Paulita Fujita Findings: The perianal and digital rectal examinations were normal.  Internal hemorrhoids were found during retroflexion. The hemorrhoids were moderate. No additional abnormalities were found on retroflexion.  A fungating, sessile and ulcerated partially obstructing large mass was found in the recto-sigmoid colon.The mass was partially circumferential (involving two-thirds of the lumen circumference). Oozing was present. Area was successfully injected with 2 mL [place] ink for tattooing. This was biopsied with a cold forceps for histology.      07/05/2017 Initial Diagnosis    Rectal adenocarcinoma ([place])      07/06/2017 Imaging    Staging MRI ABD/PELVIS revealed clinical stage T3N1M0, IIIb       07/18/2017 -  Radiation Therapy    The patient began radiation therapy on 07/18/17 supervised by Dr Lisbeth Renshaw. Plan to complete on 08/24/17      07/18/2017 Imaging   CT CHEST  IMPRESSION: Negative. No evidence of thoracic metastatic disease or other significant abnormality.       07/18/2017 -  Chemotherapy    Xeloda 2000mg  am and 1500mg  pm every 12 hours, with concurrent radiation       HISTORY OF PRESENTING ILLNESS:  Paul [name] 51 y.o. male is here because of newly diagnosed rectal cancer. He has a long standing history of loose stool and developed diarrhea within the last 4 weeks. He noticed 3 episodes in 1 day of blood on toilet paper which prompted him to go to the ER on 07/03/2017. A CT ABD and pelvis revealed a mass in the rectum compatible with carcinoma. Incidentally, he tested positive for shiga like toxin producing E coli while in the ED. He was discharged from ED with no new medication. Colonoscopy on 07/05/2017 per Dr. Paulita Fujita revealed a fungating, sessile and ulcerated partially obstructing large mass in the recto-sigmoid colon. The mass was partially circumferential involving two-thirds of the lumen circumference. Oozing was present. Biopsy on 07/05/17 confirmed invasive adenocarcinoma of the rectum. Staging MRI on 07/06/17 revealed extension through muscularis propria 1-40mm and nodes within the mesorectum and sigmoid meso colon measuring 61mm, indicating cT3N1Mo, stage IIIb. He was referred to GI surgery Dr. Johney Maine, seen 07/09/2017.  Today he presents to clinic with a friend, referred by Dr. Johney Maine. He feels well overall with good appetite, stable weight, and no fatigue. His diarrhea has resolved but reports 2-3 loose stools per day, associated with cramping. No nausea, vomiting, or abdominal pain.   CURRENT THERAPY: Xeloda 2000mg  am and 1500mg  pm every 12  hours, with concurrent radiation, started on 07/18/2017, plan to complete on 08/24/17  INTERIM HISTORY:  Paul [name] returns today for scheduled follow up. He presents to the clinic today and he wants to know since he started his Xeloda in the middle of the week but his radiation ends on a  Friday he will end Xeloda earlier and Dr. Lisbeth Renshaw said that was fine.  He has rectum pain that came back last week. He would like something for the pain. He is still having rectal bleeding. He is not on any blood thinners. He does not want to be out of work this week but due to pain and his last treatment he would like a letter excusing him from work.     MEDICAL HISTORY:  Past Medical History:  Diagnosis Date  . Allergy    grass, dander  . Colon cancer ([place]) 07/05/2017   rectal adenocarcinoma  . Family history of prostate cancer   . GERD (gastroesophageal reflux disease)   . History of meningitis   . Hypertension    SURGICAL HISTORY: Past Surgical History:  Procedure Laterality Date  . COLONOSCOPY WITH PROPOFOL Left 07/05/2017   Procedure: COLONOSCOPY WITH PROPOFOL;  Surgeon: Arta Silence, MD;  Location: [place];  Service: Endoscopy;  Laterality: Left;   SOCIAL HISTORY: Social History   Social History  . Marital status: Married    Spouse name: N/A  . Number of children: N/A  . Years of education: N/A   Occupational History  . Not on file.   Social History Main Topics  . Smoking status: Never Smoker  . Smokeless tobacco: Never Used  . Alcohol use No  . Drug use: No  . Sexual activity: Not on file   Other Topics Concern  . Not on file   Social History Narrative   Originally from [place], [place]   Patient is married with 3 children, ages 9, 39, and 44. He lives in a home with his wife and kids. 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 He works 3-11:30 pm shift; he has morning/daytime availability for appointments and treatments. He prefers to work during anticipated treatment but can apply for [employer]. He will send appropriate paperwork when the time comes.   FAMILY HISTORY: Family History  Problem Relation Age of Onset  . Hypertension Mother   . Asthma Father   . Cancer  Father        Prostate  . Asthma Son   . Asthma Son   . Asthma Daughter   . Dementia Maternal Grandmother    ALLERGIES:  is allergic to chloroquine.  MEDICATIONS:  Current Outpatient Prescriptions  Medication Sig Dispense Refill  . amLODipine (NORVASC) 5 MG tablet TAKE 1 TABLET DAILY 90 tablet 1  . calcium carbonate (OS-CAL - DOSED IN MG OF ELEMENTAL CALCIUM) 1250 (500 Ca) MG tablet Take 1 tablet by mouth daily as needed (for bones).    . capecitabine (XELODA) 500 MG tablet Take 4 tabs (2000mg ) in AM & 3 tabs (1500mg ) in PM, within 35min of meals, days of radiation only, M-F 56 tablet 0  . cetirizine (ZYRTEC) 10 MG tablet Take 10 mg by mouth daily as needed for allergies.    . Garlic 2 MG CAPS Take 2 mg by mouth daily.    . Multiple Vitamin (MULTIVITAMIN WITH MINERALS) TABS tablet Take 1 tablet by mouth daily.    Marland Kitchen omeprazole (PRILOSEC) 40  MG capsule Take 40 mg by mouth daily as needed (heart burn).     . ondansetron (ZOFRAN) 8 MG tablet Take 1 tablet (8 mg total) by mouth every 8 (eight) hours as needed for nausea or vomiting. 30 tablet 3  . psyllium (METAMUCIL) 58.6 % powder Take 1 packet by mouth 3 (three) times daily as needed.    . prochlorperazine (COMPAZINE) 10 MG tablet Take 1 tablet (10 mg total) by mouth every 6 (six) hours as needed for nausea or vomiting. (Patient not taking: Reported on 08/13/2017) 30 tablet 2  . traMADol (ULTRAM) 50 MG tablet Take 1 tablet (50 mg total) by mouth every 6 (six) hours as needed. 30 tablet 1   No current facility-administered medications for this visit.    REVIEW OF SYSTEMS:   Constitutional: Denies fatigue, fevers, chills, abnormal night sweats, or weight loss. Eyes: Denies blurriness of vision, double vision or watery eyes Ears, nose, mouth, throat, and face: Denies mucositis or sore throat Respiratory: Denies cough, dyspnea or wheezes Cardiovascular: Denies palpitation, chest discomfort or lower extremity swelling Gastrointestinal:  (+)  nausea, vomiting, constipation, controlled (+) rectal pain due to radiation, rectal bleeding Skin: Denies abnormal skin rashes Lymphatics: Denies new lymphadenopathy or easy bruising Neurological:Denies numbness, tingling or new weaknesses Behavioral/Psych: Mood is stable, no new changes  All other systems were reviewed with the patient and are negative.  PHYSICAL EXAMINATION:  ECOG PERFORMANCE STATUS: 1  Vitals:   08/20/17 1004  BP: (!) 144/89  Pulse: 74  Resp: 18  Temp: 98.4 F (36.9 C)  SpO2: 100%   Filed Weights   08/20/17 1004  Weight: 191 lb 14.4 oz (87 kg)     GENERAL:alert, no distress and comfortable SKIN: skin color, texture, turgor are normal, no rashes or significant lesions EYES: normal, conjunctiva are pink and non-injected, sclera clear OROPHARYNX:no exudate, no erythema and lips, buccal mucosa, and tongue normal  NECK: supple, thyroid normal size, non-tender, without nodularity LYMPH:  no palpable cervical or supraclavicular lymphadenopathy  LUNGS: clear to auscultation bilaterally with normal breathing effort HEART: regular rate & rhythm, S1 and S2 present; no murmurs and no lower extremity edema ABDOMEN:abdomen soft, flat, non-tender and normal bowel sounds. No palpable hepatosplenomegaly or masses. MSK:no cyanosis of digits and no clubbing  PSYCH: alert & oriented x 3 with fluent speech NEURO: no focal motor/sensory deficits RECTAL: external exam: (+) perianal skin hyperpigmentation, no blisters, skin peeling or ulcers. Digital exam was deferred   LABORATORY DATA:  I have reviewed the data as listed CBC Latest Ref Rng & Units 08/20/2017 08/13/2017 08/06/2017  WBC 4.0 - 10.3 10e3/uL 3.0(L) 2.8(L) 2.3(L)  Hemoglobin 13.0 - 17.1 g/dL 12.0(L) 12.8(L) 13.2  Hematocrit 38.4 - 49.9 % 36.1(L) 38.3(L) 39.6  Platelets 140 - 400 10e3/uL 167 141 144   CMP Latest Ref Rng & Units 08/20/2017 08/13/2017 08/06/2017  Glucose 70 - 140 mg/dl 115 114 113  BUN 7.0 - 26.0  mg/dL 9.5 13.0 10.9  Creatinine 0.7 - 1.3 mg/dL 0.9 0.9 0.9  Sodium 136 - 145 mEq/L 141 140 141  Potassium 3.5 - 5.1 mEq/L 4.4 4.5 4.0  Chloride 101 - 111 mmol/L - - -  CO2 22 - 29 mEq/L 27 28 28   Calcium 8.4 - 10.4 mg/dL 9.4 9.4 9.3  Total Protein 6.4 - 8.3 g/dL 7.2 7.3 7.1  Total Bilirubin 0.20 - 1.20 mg/dL 0.55 0.49 0.34  Alkaline Phos 40 - 150 U/L 72 72 75  AST 5 - 34 U/L  24 36(H) 45(H)  ALT 0 - 55 U/L 28 40 53   PATHOLOGY RESULTS: Diagnosis 07/05/17 Rectum, biopsy - INVASIVE ADENOCARCINOMA. - SEE COMMENT. Microscopic Comment The adenocarcinoma is poorly differentiated. Dr. Vicente Males has reviewed the case and concurs with this interpretation. Dr. Paulita Fujita was paged on 07/06/17. (JBK:gt, 07/06/17)  PROCEDURES  COLONOSCOPY: Dr. Paulita Fujita 07/05/2017 - Internal hemorrhoids. - Likely malignant partially obstructing tumor in the recto-sigmoid colon. Injected. Biopsied.   RADIOGRAPHIC STUDIES: I have personally reviewed the radiological images as listed and agreed with the findings in the report. No results found.  ASSESSMENT & PLAN:  Zarian Colpitts is a 51 y.o.  with history of allergies, HTN, GERD, and chronic low back pain. He presents with rectal cancer diagnosed 07/05/2017.  1. Poorly differentiated invasive rectal adenocarcinoma, cT3N1M0, stage IIIb -I reviewed his biopsy and CT, MRI scan results with the patient and his friend in detail.  -His tumor is located in the rectosigmoid colon. -We discussed he has likely locally advanced stage IIIb disease based on tumor size, invasion into the muscularis, and positive lymph node, no distant metastasis on day CT and MRI of abdomen and pelvis. -CT chest was negative for metastasis -He has seen Dr. Johney Maine on 07/09/17 who recommends neoadjuvant chemoradiation to help shrink tumor followed by surgical low anterior resection with possible adjuvant chemotherapy after surgery. We are in agreement with this plan. -He has started concurrent  chemoRT with Xeloda on 07/18/17 and plan to complete on 08/24/17 -He has mild nausea from Xeloda, this is well controlled with antiemetics.  -He has experienced some rectal bleeding likely related to radiation and or hemorrhoids. He will continue to monitor amount of rectal bleeding and call clinic if this increases. His 08/13/17 Hg was 12.8.  -His Hg is slightly dropping with continued rectal bleeding, Hg now 12.0. No need for blood transfusion at this time. ANC in normal range at 2.0. He is fine to proceed with treatment. -he is going to finish treatment this week -he will f/u with Dr. Johney Maine to discuss surgery  -f/u in 2 weeks   2. Rectal pain and bleeding  -Secondary to chemoradiation, and possible hemorrhoids bleeding. -prescribed tramadol every 6 hours today (08/20/17) -He also has internal rectal bleeding only with BM.  -His Hg is slightly dropping, Hg now 12.0 (08/17/17). He is not of any blood thinner.  -I will discussed if Dr. Lisbeth Renshaw can give him cream for his inflammation.  -I encouraged him to take stool softer so he is not straining to have a bowel movement.  -Will monitor.   3. Genetics -Due to his age, he is a candidate for genetics referral to assess whether genetic mutation contributed to his cancer, he agrees  -Referral to genetics placed previously -Seen by Genetics on 08/08/17, results are pending.   4. Nausea and constipation -He was prescribed Zofran, but he states that this did not help like he would like to control his nausea. I will add on Compazine which I informed him he can take 2-3 times a day with Zofran for better control.  -Informed him that he can take Colace as a stool softener for his constipation, if this does not help I also told him that he could take a Laxative 2-3 times daily, such as Senokot or Colace.  -Well controlled lately with Miralax, Zofran and compazine. I suggest if needed he can increase his Miralax to 3-4 times daily.   5. Neutropenia,  secondary to chemotherapy -ANC 1.4 today, we'll continue  weekly lab -if he develops moderate to severe neutropenia, will reduce or hold Xeloda  -If ANC<0.5, will inform rad/onc to hold radiation   6 Palmer dryness and hyperpigmentation -Secondary to Xeloda -He will continue Eucerin and use hydrocortisone PRN   PLAN: -Provided letter for time off from work  -Prescribed Tramadol today -Continue concurrent radiation, plan to complete on 11/2  - Lab and f/u in 2 weeks     No orders of the defined types were placed in this encounter.  All questions were answered. The patient knows to call the clinic with any problems, questions or concerns.  Truitt Merle, MD 08/20/2017 11:33 PM   This document serves as a record of services personally performed by Truitt Merle, MD. It was created on her behalf by Joslyn Devon, a trained medical scribe. The creation of this record is based on the scribe's personal observations and the provider's statements to them. This document has been checked and approved by the attending provider.

## 2017-08-20 ENCOUNTER — Ambulatory Visit
Admission: RE | Admit: 2017-08-20 | Discharge: 2017-08-20 | Disposition: A | Discharge status: Home / Self Care | Payer: BLUE CROSS/BLUE SHIELD | Source: Ambulatory Visit | Attending: Radiation Oncology | Admitting: Radiation Oncology

## 2017-08-20 ENCOUNTER — Encounter: Payer: Self-pay | Admitting: Hematology

## 2017-08-20 ENCOUNTER — Other Ambulatory Visit (HOSPITAL_BASED_OUTPATIENT_CLINIC_OR_DEPARTMENT_OTHER): Payer: BLUE CROSS/BLUE SHIELD

## 2017-08-20 ENCOUNTER — Ambulatory Visit (HOSPITAL_BASED_OUTPATIENT_CLINIC_OR_DEPARTMENT_OTHER): Payer: BLUE CROSS/BLUE SHIELD | Admitting: Hematology

## 2017-08-20 VITALS — BP 144/89 | HR 74 | Temp 98.4°F | Resp 18 | Ht 73.0 in | Wt 191.9 lb

## 2017-08-20 DIAGNOSIS — I10 Essential (primary) hypertension: Secondary | ICD-10-CM

## 2017-08-20 DIAGNOSIS — C20 Malignant neoplasm of rectum: Secondary | ICD-10-CM

## 2017-08-20 DIAGNOSIS — Z510 Encounter for antineoplastic radiation therapy: Secondary | ICD-10-CM | POA: Diagnosis not present

## 2017-08-20 DIAGNOSIS — D701 Agranulocytosis secondary to cancer chemotherapy: Secondary | ICD-10-CM | POA: Diagnosis not present

## 2017-08-20 LAB — COMPREHENSIVE METABOLIC PANEL
ALT: 28 U/L (ref 0–55)
ANION GAP: 7 meq/L (ref 3–11)
AST: 24 U/L (ref 5–34)
Albumin: 4 g/dL (ref 3.5–5.0)
Alkaline Phosphatase: 72 U/L (ref 40–150)
BUN: 9.5 mg/dL (ref 7.0–26.0)
CALCIUM: 9.4 mg/dL (ref 8.4–10.4)
CHLORIDE: 106 meq/L (ref 98–109)
CO2: 27 meq/L (ref 22–29)
CREATININE: 0.9 mg/dL (ref 0.7–1.3)
Glucose: 115 mg/dl (ref 70–140)
Potassium: 4.4 mEq/L (ref 3.5–5.1)
Sodium: 141 mEq/L (ref 136–145)
Total Bilirubin: 0.55 mg/dL (ref 0.20–1.20)
Total Protein: 7.2 g/dL (ref 6.4–8.3)

## 2017-08-20 LAB — CBC WITH DIFFERENTIAL/PLATELET
BASO%: 0.3 % (ref 0.0–2.0)
BASOS ABS: 0 10*3/uL (ref 0.0–0.1)
EOS ABS: 0.2 10*3/uL (ref 0.0–0.5)
EOS%: 6.4 % (ref 0.0–7.0)
HEMATOCRIT: 36.1 % — AB (ref 38.4–49.9)
HGB: 12 g/dL — ABNORMAL LOW (ref 13.0–17.1)
LYMPH#: 0.3 10*3/uL — AB (ref 0.9–3.3)
LYMPH%: 9.8 % — AB (ref 14.0–49.0)
MCH: 29.1 pg (ref 27.2–33.4)
MCHC: 33.2 g/dL (ref 32.0–36.0)
MCV: 87.6 fL (ref 79.3–98.0)
MONO#: 0.5 10*3/uL (ref 0.1–0.9)
MONO%: 16.3 % — ABNORMAL HIGH (ref 0.0–14.0)
NEUT#: 2 10*3/uL (ref 1.5–6.5)
NEUT%: 67.2 % (ref 39.0–75.0)
PLATELETS: 167 10*3/uL (ref 140–400)
RBC: 4.12 10*6/uL — AB (ref 4.20–5.82)
RDW: 15.8 % — ABNORMAL HIGH (ref 11.0–14.6)
WBC: 3 10*3/uL — ABNORMAL LOW (ref 4.0–10.3)

## 2017-08-20 MED ORDER — TRAMADOL HCL 50 MG PO TABS: 50.0000 mg | ORAL_TABLET | Freq: Four times a day (QID) | ORAL | 1 refills | Status: DC | PRN

## 2017-08-21 ENCOUNTER — Telehealth: Payer: Self-pay

## 2017-08-21 ENCOUNTER — Ambulatory Visit
Admission: RE | Admit: 2017-08-21 | Discharge: 2017-08-21 | Disposition: A | Discharge status: Home / Self Care | Payer: BLUE CROSS/BLUE SHIELD | Source: Ambulatory Visit | Attending: Radiation Oncology | Admitting: Radiation Oncology

## 2017-08-21 ENCOUNTER — Encounter: Payer: Self-pay | Admitting: Hematology

## 2017-08-21 ENCOUNTER — Ambulatory Visit: Payer: BLUE CROSS/BLUE SHIELD | Admitting: Gastroenterology

## 2017-08-21 DIAGNOSIS — Z510 Encounter for antineoplastic radiation therapy: Secondary | ICD-10-CM | POA: Diagnosis not present

## 2017-08-21 NOTE — Telephone Encounter (Signed)
Spoke with patient and informed him of the appointment change per MD. Request. Per 10/29 los

## 2017-08-22 ENCOUNTER — Telehealth: Payer: Self-pay | Admitting: *Deleted

## 2017-08-22 ENCOUNTER — Other Ambulatory Visit: Payer: Self-pay | Admitting: Radiation Oncology

## 2017-08-22 ENCOUNTER — Ambulatory Visit
Admission: RE | Admit: 2017-08-22 | Discharge: 2017-08-22 | Disposition: A | Discharge status: Home / Self Care | Payer: BLUE CROSS/BLUE SHIELD | Source: Ambulatory Visit | Attending: Radiation Oncology | Admitting: Radiation Oncology

## 2017-08-22 DIAGNOSIS — C20 Malignant neoplasm of rectum: Secondary | ICD-10-CM

## 2017-08-22 DIAGNOSIS — Z510 Encounter for antineoplastic radiation therapy: Secondary | ICD-10-CM | POA: Diagnosis not present

## 2017-08-22 MED ORDER — HYDROCODONE-ACETAMINOPHEN 5-325 MG PO TABS: 1.0000 | ORAL_TABLET | Freq: Four times a day (QID) | ORAL | 0 refills | Status: DC | PRN

## 2017-08-22 NOTE — Telephone Encounter (Signed)
I would prefer he try this first without oxycodone

## 2017-08-22 NOTE — Telephone Encounter (Signed)
Returned call to patient, can pick up pain rx oxycodone tomorrwo at nursing

## 2017-08-22 NOTE — Telephone Encounter (Signed)
Called [facility] at [facility] per patient request at [phone],  Per written order from Shona Simpson, PA-C, RX anusol suppositories(1 box24), to take 1 suppository rectal bid x 1 week, no refills,, patient aware to pick and try will call, he asked for oxycodone, rx,, he took 2 of his wife's yesterday stated that worked for his rectal pain, will let Bryson Ha know , he will call  Today to give dosage of the oxycodone he is requesting, 9:22 AM

## 2017-08-23 ENCOUNTER — Ambulatory Visit
Admission: RE | Admit: 2017-08-23 | Discharge: 2017-08-23 | Disposition: A | Discharge status: Home / Self Care | Payer: BLUE CROSS/BLUE SHIELD | Source: Ambulatory Visit | Attending: Radiation Oncology | Admitting: Radiation Oncology

## 2017-08-23 DIAGNOSIS — Z510 Encounter for antineoplastic radiation therapy: Secondary | ICD-10-CM | POA: Diagnosis not present

## 2017-08-24 ENCOUNTER — Encounter: Payer: Self-pay | Admitting: Radiation Oncology

## 2017-08-24 ENCOUNTER — Ambulatory Visit
Admission: RE | Admit: 2017-08-24 | Discharge: 2017-08-24 | Disposition: A | Discharge status: Home / Self Care | Payer: BLUE CROSS/BLUE SHIELD | Source: Ambulatory Visit | Attending: Radiation Oncology | Admitting: Radiation Oncology

## 2017-08-24 DIAGNOSIS — Z510 Encounter for antineoplastic radiation therapy: Secondary | ICD-10-CM | POA: Diagnosis not present

## 2017-08-27 ENCOUNTER — Ambulatory Visit: Payer: BLUE CROSS/BLUE SHIELD | Admitting: Hematology

## 2017-08-27 ENCOUNTER — Telehealth: Payer: Self-pay | Admitting: *Deleted

## 2017-08-27 ENCOUNTER — Other Ambulatory Visit: Payer: BLUE CROSS/BLUE SHIELD

## 2017-08-27 NOTE — Telephone Encounter (Signed)
CALLED PATIENT TO INFORM OF FU APPT. BEING MOVED TO 09-20-17 @ 8 AM, LVM FOR A RETURN CALL

## 2017-08-30 ENCOUNTER — Telehealth: Payer: Self-pay | Admitting: Genetic Counselor

## 2017-08-30 ENCOUNTER — Ambulatory Visit: Payer: Self-pay | Admitting: Genetic Counselor

## 2017-08-30 ENCOUNTER — Telehealth: Payer: Self-pay | Admitting: Hematology

## 2017-08-30 ENCOUNTER — Encounter: Payer: Self-pay | Admitting: Genetic Counselor

## 2017-08-30 DIAGNOSIS — C20 Malignant neoplasm of rectum: Secondary | ICD-10-CM

## 2017-08-30 DIAGNOSIS — Z8042 Family history of malignant neoplasm of prostate: Secondary | ICD-10-CM

## 2017-08-30 DIAGNOSIS — Z1379 Encounter for other screening for genetic and chromosomal anomalies: Secondary | ICD-10-CM | POA: Insufficient documentation

## 2017-08-30 NOTE — Telephone Encounter (Signed)
Revealed negative genetic testing.  Discussed that we do not know why he has colon cancer or why there is cancer in the family. It could be due to a different gene that we are not testing, or maybe our current technology may not be able to pick something up.  It will be important for him to keep in contact with genetics to keep up with whether additional testing may be needed.  Discussed AXIN2 VUS and that these are typically reclassified and determined to be benign.  We will recontact him when this is reclassified.

## 2017-08-30 NOTE — Progress Notes (Signed)
HPI: [name] was previously seen in the [place] clinic due to a personal and family history of cancer and concerns regarding a hereditary predisposition to cancer. Please refer to our prior cancer genetics clinic note for more information regarding [name] medical, social and family histories, and our assessment and recommendations, at the time. [name] recent genetic test results were disclosed to him, as were recommendations warranted by these results. These results and recommendations are discussed in more detail below.  CANCER HISTORY:  Oncology History   Cancer Staging Rectal adenocarcinoma [facility]) Staging form: Colon and Rectum, AJCC 8th Edition - Clinical stage from 07/06/2017: Stage IIIB (cT3, cN1, cM0) - Signed by [name], NP on 07/10/2017      Rectal adenocarcinoma ([place])   07/03/2017 Imaging    CT ABD PELVIS W CONTRAST IMPRESSION: Possible mass in the rectum compatible carcinoma. Endoscopy recommended for further evaluation. Otherwise negative.      07/03/2017 Tumor Marker    CEA 2.7      07/05/2017 Initial Biopsy    Rectum biopsy -invasive adenocarcinoma, poorly differentiated      07/05/2017 Procedure    Colonoscopy Per Dr. Paulita Fujita Findings: The perianal and digital rectal examinations were normal.  Internal hemorrhoids were found during retroflexion. The hemorrhoids were moderate. No additional abnormalities were found on retroflexion.  A fungating, sessile and ulcerated partially obstructing large mass was found in the recto-sigmoid colon.The mass was partially circumferential (involving two-thirds of the lumen circumference). Oozing was present. Area was successfully injected with 2 mL [place] ink for tattooing. This was biopsied with a cold forceps for histology.      07/05/2017 Initial Diagnosis    Rectal adenocarcinoma ([place])      07/06/2017 Imaging    Staging MRI ABD/PELVIS revealed clinical stage T3N1M0, IIIb       07/18/2017 -  Radiation Therapy    The patient began radiation therapy on 07/18/17 supervised by Dr Lisbeth Renshaw. Plan to complete on 08/24/17      07/18/2017 Imaging    CT CHEST  IMPRESSION: Negative. No evidence of thoracic metastatic disease or other significant abnormality.       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  The Hereditary Gene Panel offered by [facility] includes sequencing and/or deletion duplication testing of the following 47 genes: APC, ATM, AXIN2, BARD1, BMPR1A, BRCA1, BRCA2, BRIP1, CDH1, CDK4, CDKN2A (p14ARF), CDKN2A (p16INK4a), CHEK2, CTNNA1, DICER1, EPCAM (Deletion/duplication testing only), GREM1 (promoter region deletion/duplication testing only), KIT, MEN1, MLH1, MSH2, MSH3, MSH6, MUTYH, NBN, NF1, NHTL1, PALB2, PDGFRA, PMS2, POLD1, POLE, PTEN, RAD50, RAD51C, RAD51D, SDHB, SDHC, SDHD, SMAD4, SMARCA4. STK11, TP53, TSC1, TSC2, and VHL.  The following genes were evaluated for sequence changes only: SDHA and HOXB13 c.251G>A variant only.  The report date is August 27, 2017.        FAMILY HISTORY:  We obtained a detailed, 4-generation family history.  Significant diagnoses are listed below: Family History  Problem Relation Age of Onset  . Hypertension Mother   . Asthma Father   . Cancer Father        Prostate  . Asthma Son   . Asthma Son   . Asthma Daughter   . Dementia Maternal Grandmother     The patient has two sons and a daughter  who are cancer free.   He has 18 full and half siblings, none who have cancer that he is aware of.  The patient's father was diagnosed with prostate cancer at 46 and died of prostate cancer at 59.  His mother is alive at 36.  She has not had cancer.  There is no other reported family history of cancer.  [name] is unaware of previous family history  of genetic testing for hereditary cancer risks. Patient's maternal ancestors are of [place] descent, and paternal ancestors are of [place] descent. There is no reported Ashkenazi Jewish ancestry. There is no known consanguinity.  GENETIC TEST RESULTS: Genetic testing reported out on August 27, 2017 through the common hereditary cancer panel found no deleterious mutations.  The Hereditary Gene Panel offered by [facility] includes sequencing and/or deletion duplication testing of the following 47 genes: APC, ATM, AXIN2, BARD1, BMPR1A, BRCA1, BRCA2, BRIP1, CDH1, CDK4, CDKN2A (p14ARF), CDKN2A (p16INK4a), CHEK2, CTNNA1, DICER1, EPCAM (Deletion/duplication testing only), GREM1 (promoter region deletion/duplication testing only), KIT, MEN1, MLH1, MSH2, MSH3, MSH6, MUTYH, NBN, NF1, NHTL1, PALB2, PDGFRA, PMS2, POLD1, POLE, PTEN, RAD50, RAD51C, RAD51D, SDHB, SDHC, SDHD, SMAD4, SMARCA4. STK11, TP53, TSC1, TSC2, and VHL.  The following genes were evaluated for sequence changes only: SDHA and HOXB13 c.251G>A variant only. The test report has been scanned into EPIC and is located under the Molecular Pathology section of the Results Review tab.   We discussed with [name] that since the current genetic testing is not perfect, it is possible there may be a gene mutation in one of these genes that current testing cannot detect, but that chance is small. We also discussed, that it is possible that another gene that has not yet been discovered, or that we have not yet tested, is responsible for the cancer diagnoses in the family, and it is, therefore, important to remain in touch with cancer genetics in the future so that we can continue to offer [name] the most up to date genetic testing.   Genetic testing did detect a Variant of Unknown Significance in the AXIN2 gene called c.1448C>G (p.Pro483Arg).  At this time, it is unknown if this variant is associated with increased cancer risk or if this is a normal finding, but  most variants such as this get reclassified to being inconsequential. It should not be used to make medical management decisions. With time, we suspect the lab will determine the significance of this variant, if any. If we do learn more about it, we will try to contact [name] to discuss it further. However, it is important to stay in touch with [place] periodically and keep the address and phone number up to date.       CANCER SCREENING RECOMMENDATIONS:  This result is reassuring and indicates that [name] likely does not have an increased risk for a future cancer due to a mutation in one of these genes. This normal test also suggests that [name] cancer was most likely not due to an inherited predisposition associated with one of these genes.  Most cancers happen by chance and this negative test suggests that his cancer falls into this category.  We, therefore, recommended he continue to follow the cancer management and screening guidelines provided by his oncology and primary healthcare provider.   RECOMMENDATIONS FOR FAMILY MEMBERS: Women in this family might be at some increased risk of developing cancer, over the general population risk, simply due to the family history of cancer. We recommended women in  this family have a yearly mammogram beginning at age 13, or 31 years younger than the earliest onset of cancer, an annual clinical breast exam, and perform monthly breast self-exams. Women in this family should also have a gynecological exam as recommended by their primary provider. All family members should have a colonoscopy by age 76.  FOLLOW-UP: Lastly, we discussed with [name] that cancer genetics is a rapidly advancing field and it is possible that new genetic tests will be appropriate for him and/or his family members in the future. We encouraged him to remain in contact with cancer genetics on an annual basis so we can update his personal and family histories and let him know  of advances in cancer genetics that may benefit this family.   Our contact number was provided. [name] questions were answered to his satisfaction, and he knows he is welcome to call us at anytime with additional questions or concerns.   Roma Kayser, MS, [facility] Certified Genetic Counselor [email or web address]

## 2017-08-30 NOTE — Telephone Encounter (Signed)
08/27/2017 Patient will come and pick up FMLA/Disability forms @ front desk receptionist

## 2017-08-31 NOTE — Progress Notes (Signed)
Paul [name]  [phone] [phone]  Clinic Follow Up Note   Patient Care Team: Eulas Post, MD as PCP - General (Family Medicine) Arta Silence, MD as Consulting Physician (Gastroenterology) Michael Boston, MD as Consulting Physician (General Surgery) Kyung Rudd, MD as Consulting Physician (Radiation Oncology) 09/03/2017   CHIEF COMPLAINTS:  F/u rectal cancer  Oncology History   Cancer Staging Rectal adenocarcinoma [facility]) Staging form: Colon and Rectum, AJCC 8th Edition - Clinical stage from 07/06/2017: Stage IIIB (cT3, cN1, cM0) - Signed by [name], NP on 07/10/2017      Rectal adenocarcinoma ([place])   07/03/2017 Imaging    CT ABD PELVIS W CONTRAST IMPRESSION: Possible mass in the rectum compatible carcinoma. Endoscopy recommended for further evaluation. Otherwise negative.      07/03/2017 Tumor Marker    CEA 2.7      07/05/2017 Initial Biopsy    Rectum biopsy -invasive adenocarcinoma, poorly differentiated      07/05/2017 Procedure    Colonoscopy Per Dr. Paulita Fujita Findings: The perianal and digital rectal examinations were normal.  Internal hemorrhoids were found during retroflexion. The hemorrhoids were moderate. No additional abnormalities were found on retroflexion.  A fungating, sessile and ulcerated partially obstructing large mass was found in the recto-sigmoid colon.The mass was partially circumferential (involving two-thirds of the lumen circumference). Oozing was present. Area was successfully injected with 2 mL [place] ink for tattooing. This was biopsied with a cold forceps for histology.      07/05/2017 Initial Diagnosis    Rectal adenocarcinoma ([place])      07/06/2017 Imaging    Staging MRI ABD/PELVIS revealed clinical stage T3N1M0, IIIb       07/18/2017 - 08/24/2017 Radiation Therapy    The patient began radiation therapy on 07/18/17 supervised by Dr Lisbeth Renshaw. Plan to complete on 08/24/17      07/18/2017  Imaging    CT CHEST  IMPRESSION: Negative. No evidence of thoracic metastatic disease or other significant abnormality.       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  The Hereditary Gene Panel offered by [facility] includes sequencing and/or deletion duplication testing of the following 47 genes: APC, ATM, AXIN2, BARD1, BMPR1A, BRCA1, BRCA2, BRIP1, CDH1, CDK4, CDKN2A (p14ARF), CDKN2A (p16INK4a), CHEK2, CTNNA1, DICER1, EPCAM (Deletion/duplication testing only), GREM1 (promoter region deletion/duplication testing only), KIT, MEN1, MLH1, MSH2, MSH3, MSH6, MUTYH, NBN, NF1, NHTL1, PALB2, PDGFRA, PMS2, POLD1, POLE, PTEN, RAD50, RAD51C, RAD51D, SDHB, SDHC, SDHD, SMAD4, SMARCA4. STK11, TP53, TSC1, TSC2, and VHL.  The following genes were evaluated for sequence changes only: SDHA and HOXB13 c.251G>A variant only.  The report date is August 27, 2017.       HISTORY OF PRESENTING ILLNESS:  Paul [name] [age].o. male is here because of newly diagnosed rectal cancer. He has a long standing history of loose stool and developed diarrhea within the last 4 weeks. He noticed 3 episodes in 1 day of blood on toilet paper which prompted him to go to the ER on 07/03/2017. A CT ABD and pelvis revealed a mass in the rectum compatible with carcinoma. Incidentally, he tested positive for shiga like toxin producing E coli while in the ED. He was discharged from ED with no new medication. Colonoscopy on 07/05/2017 per Dr. OuPaulita Fujitaevealed a fungating, sessile  and ulcerated partially obstructing large mass in the recto-sigmoid colon. The mass was partially circumferential involving two-thirds of the lumen circumference. Oozing was present. Biopsy on 07/05/17 confirmed invasive adenocarcinoma of the rectum. Staging MRI on 07/06/17 revealed  extension through muscularis propria 1-49m and nodes within the mesorectum and sigmoid meso colon measuring 961m indicating cT3N1Mo, stage IIIb. He was referred to GI surgery Dr. GrJohney Maineseen 07/09/2017.  Today he presents to clinic with a friend, referred by Dr. GrJohney MaineHe feels well overall with good appetite, stable weight, and no fatigue. His diarrhea has resolved but reports 2-3 loose stools per day, associated with cramping. No nausea, vomiting, or abdominal pain.   CURRENT THERAPY: PENDING Surgery    INTERIM HISTORY:  MaJanthony Hollemaneturns today for scheduled follow up post chemoradiation. He presents to the clinic today noting he still has rectal pain. He takes pain medication 1-2 a day especially after his first BM because that is when he starts to feel the pain. He has 2-3 BM a day. He notes still seeing blood in stool and rectal bleeding, but not as bad as before. He is not taking oral iron pill. He is scheduled for surgery on 10/10/17.  He notes nausea and his appetite was effected by Xeloda and wonders will this be the case with his next chemo treatment. He would like help with a financial advisor as [facility] and needs help. He requested a letter to return to work until his surgery.  He notes change in urination flow, it will stop and start. He denies urgency or dysuria.   MEDICAL HISTORY:  Past Medical History:  Diagnosis Date  . Allergy    grass, dander  . [place]   rectal adenocarcinoma  . Family history of prostate cancer   . GERD (gastroesophageal reflux disease)   . History of meningitis   . Hypertension    SURGICAL HISTORY: History reviewed. No pertinent surgical history. SOCIAL HISTORY: Social History   Socioeconomic History  . Marital status: Married    Spouse name: Not on file  . Number of children: Not on file  . Years of education: Not on file    . Highest education level: Not on file  Social Needs  . Financial resource strain: Not on file  . Food insecurity - worry: Not on file  . Food insecurity - inability: Not on file  . Transportation needs - medical: Not on file  . Transportation needs - non-medical: Not on file  Occupational History  . Not on file  Tobacco Use  . Smoking status: Never Smoker  . Smokeless tobacco: Never Used  Substance and Sexual Activity  . Alcohol use: No  . Drug use: No  . Sexual activity: Not on file  Other Topics Concern  . Not on file  Social History Narrative   [place] Patient is married with 3 children, ages 12689,67and 7.33He lives in a home with his wife and kids. 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 He works 3-11:30 pm shift; he has morning/daytime availability for appointments and treatments. He prefers to work during anticipated treatment but can apply for FMFortune BrandsHe will send appropriate paperwork when the time comes.   FAMILY HISTORY: Family History  Problem Relation Age  of Onset  . Hypertension Mother   . Asthma Father   . Cancer Father        Prostate  . Asthma Son   . Asthma Son   . Asthma Daughter   . Dementia Maternal Grandmother    ALLERGIES:  is allergic to chloroquine.  MEDICATIONS:  Current Outpatient Medications  Medication Sig Dispense Refill  . amLODipine (NORVASC) 5 MG tablet TAKE 1 TABLET DAILY 90 tablet 1  . calcium carbonate (OS-CAL - DOSED IN MG OF ELEMENTAL CALCIUM) 1250 (500 Ca) MG tablet Take 1 tablet by mouth daily as needed (for bones).    . cetirizine (ZYRTEC) 10 MG tablet Take 10 mg by mouth daily as needed for allergies.    . Garlic 2 MG CAPS Take 2 mg by mouth daily.    Marland Kitchen HYDROcodone-acetaminophen (NORCO) 5-325 MG tablet Take 1-2 tablets by mouth every 6 (six) hours as needed for moderate pain. 60 tablet 0  . hydrocortisone (ANUSOL-HC)  25 MG suppository Place 25 mg rectally 2 (two) times daily. Take 1 suppository 2x day for 1 week 1 box (24 in a box)[facility] [address]    . Multiple Vitamin (MULTIVITAMIN WITH MINERALS) TABS tablet Take 1 tablet by mouth daily.    Marland Kitchen omeprazole (PRILOSEC) 40 MG capsule Take 40 mg by mouth daily as needed (heart burn).     . ondansetron (ZOFRAN) 8 MG tablet Take 1 tablet (8 mg total) by mouth every 8 (eight) hours as needed for nausea or vomiting. 30 tablet 3  . psyllium (METAMUCIL) 58.6 % powder Take 1 packet by mouth 3 (three) times daily as needed.    . capecitabine (XELODA) 500 MG tablet Take 4 tabs (2060m) in AM & 3 tabs (15067m in PM, within 3017mof meals, days of radiation only, M-F (Patient not taking: Reported on 09/03/2017) 56 tablet 0  . oxyCODONE (OXY IR/ROXICODONE) 5 MG immediate release tablet Take 1-2 tablets (5-10 mg total) every 4 (four) hours as needed by mouth for severe pain. 60 tablet 0  . prochlorperazine (COMPAZINE) 10 MG tablet Take 1 tablet (10 mg total) by mouth every 6 (six) hours as needed for nausea or vomiting. (Patient not taking: Reported on 08/13/2017) 30 tablet 2  . traMADol (ULTRAM) 50 MG tablet Take 50 mg every 6 (six) hours as needed by mouth.  1   No current facility-administered medications for this visit.    REVIEW OF SYSTEMS:   Constitutional: Denies fatigue, fevers, chills, abnormal night sweats, or weight loss. Eyes: Denies blurriness of vision, double vision or watery eyes Ears, nose, mouth, throat, and face: Denies mucositis or sore throat Respiratory: Denies cough, dyspnea or wheezes Cardiovascular: Denies palpitation, chest discomfort or lower extremity swelling Gastrointestinal:  no nausea, vomiting, constipation, controlled (+) rectal pain due to radiation, managed with pain medication (+) mild rectal bleeding Urinary: (+) stop and start of urination flow Skin: Denies abnormal skin rashes Lymphatics: Denies new lymphadenopathy or easy  bruising Neurological:Denies numbness, tingling or new weaknesses Behavioral/Psych: Mood is stable, no new changes  All other systems were reviewed with the patient and are negative.  PHYSICAL EXAMINATION:  ECOG PERFORMANCE STATUS: 1  Vitals:   09/03/17 0902  BP: 133/89  Pulse: 68  Resp: 17  Temp: 98.4 F (36.9 C)  SpO2: 100%   Filed Weights   09/03/17 0902  Weight: 192 lb 1.6 oz (87.1 kg)     GENERAL:alert, no distress and comfortable SKIN: skin color, texture, turgor are  normal, no rashes or significant lesions EYES: normal, conjunctiva are pink and non-injected, sclera clear OROPHARYNX:no exudate, no erythema and lips, buccal mucosa, and tongue normal  NECK: supple, thyroid normal size, non-tender, without nodularity LYMPH:  no palpable cervical or supraclavicular lymphadenopathy  LUNGS: clear to auscultation bilaterally with normal breathing effort HEART: regular rate & rhythm, S1 and S2 present; no murmurs and no lower extremity edema ABDOMEN:abdomen soft, flat, non-tender and normal bowel sounds. No palpable hepatosplenomegaly or masses. MSK:no cyanosis of digits and no clubbing  PSYCH: alert & oriented x 3 with fluent speech NEURO: no focal motor/sensory deficits RECTAL: exam was deferred   LABORATORY DATA:  I have reviewed the data as listed CBC Latest Ref Rng & Units 09/03/2017 08/20/2017 08/13/2017  WBC 4.0 - 10.3 10e3/uL 2.3(L) 3.0(L) 2.8(L)  Hemoglobin 13.0 - 17.1 g/dL 11.4(L) 12.0(L) 12.8(L)  Hematocrit 38.4 - 49.9 % 34.4(L) 36.1(L) 38.3(L)  Platelets 140 - 400 10e3/uL 187 167 141   CMP Latest Ref Rng & Units 09/03/2017 08/20/2017 08/13/2017  Glucose 70 - 140 mg/dl 93 115 114  BUN 7.0 - 26.0 mg/dL 9.5 9.5 13.0  Creatinine 0.7 - 1.3 mg/dL 0.8 0.9 0.9  Sodium 136 - 145 mEq/L 139 141 140  Potassium 3.5 - 5.1 mEq/L 3.9 4.4 4.5  Chloride 101 - 111 mmol/L - - -  CO2 22 - 29 mEq/L _0 Calcium 8.4 - 10.4 mg/dL 9.2 9.4 9.4  Total Protein 6.4 - 8.3 g/dL  7.4 7.2 7.3  Total Bilirubin 0.20 - 1.20 mg/dL 0.63 0.55 0.49  Alkaline Phos 40 - 150 U/L 75 72 72  AST 5 - 34 U/L 51(H) 24 36(H)  ALT 0 - 55 U/L 48 28 40   PATHOLOGY RESULTS: Diagnosis 07/05/17 Rectum, biopsy - INVASIVE ADENOCARCINOMA. - SEE COMMENT. Microscopic Comment The adenocarcinoma is poorly differentiated. Dr. Vicente Males has reviewed the case and concurs with this interpretation. Dr. Paulita Fujita was paged on 07/06/17. (JBK:gt, 07/06/17)  PROCEDURES  COLONOSCOPY: Dr. Paulita Fujita 07/05/2017 - Internal hemorrhoids. - Likely malignant partially obstructing tumor in the recto-sigmoid colon. Injected. Biopsied.   RADIOGRAPHIC STUDIES: I have personally reviewed the radiological images as listed and agreed with the findings in the report. No results found.  ASSESSMENT & PLAN:  Vicente Weidler is a 51 y.o.  with history of allergies, HTN, GERD, and chronic low back pain. He presents with rectal cancer diagnosed 07/05/2017.  1. Poorly differentiated invasive rectal adenocarcinoma, cT3N1M0, stage IIIb -I reviewed his biopsy and CT, MRI scan results with the patient and his friend in detail.  -His tumor is located in the rectosigmoid colon. -We discussed he has likely locally advanced stage IIIb disease based on tumor size, invasion into the muscularis, and positive lymph node, no distant metastasis on day CT and MRI of abdomen and pelvis. -CT chest was negative for metastasis -He has seen Dr. Johney Maine on 07/09/17 who recommends neoadjuvant chemoradiation to help shrink tumor followed by surgical low anterior resection with possible adjuvant chemotherapy after surgery. We are in agreement with this plan. -He underwent concurrent chemoRT with Xeloda on 07/18/17-08/24/17, with mild nausea and rectal bleeding and residual effects on his urine output flow and rectal pain.  -He is scheduled to have surgery on 10/10/17 with Dr. Johney Maine. -I discussed the role of adjuvant chemotherapy after surgery.  Given  his locally advanced disease, positive notes on the initial scan, I recommend FOLFORX or CAPOX for 4 months.  The logistics and potential side effects from chemotherapy  were discussed with patient in details.  Given his young age, his wish not to have a port, I recommend him to try Capox.  He is agreeable. -Labs reviewed, his white blood counts are still slightly low from chemoradiatoin, his Hg is 11.4 -F/u 1/3 or 1/4 after surgery.    2. Rectal pain and bleeding  -Secondary to chemoradiation, and possible hemorrhoids bleeding. -prescribed tramadol every 6 hours today (08/20/17) -He also has internal rectal bleeding only with BM.  -His Hg is slightly dropping, Hg now 12.0 (08/17/17). He is not of any blood thinner.  -I will discussed if Dr. Lisbeth Renshaw can give him cream for his inflammation.  -I encouraged him to take stool softer so he is not straining to have a bowel movement.  -Will monitor.  -He will get refill of pain medication with Ebony Hail from rad/onc today (09/03/17) -Hg now 11.4 today (09/03/17), I recommend he take OTC ferrous sulfate to help his iron level.   3. 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    4. Nausea and constipation -He was prescribed Zofran, but he states that this did not help like he would like to control his nausea. I will add on Compazine which I informed him he can take 2-3 times a day with Zofran for better control.  -Informed him that he can take Colace as a stool softener for his constipation, if this does not help I also told him that he could take a Laxative 2-3 times daily, such as Senokot or Colace.  -Well controlled lately with Miralax, Zofran and compazine. I suggest if needed he can  increase his Miralax to 3-4 times daily.   5. Neutropenia, secondary to chemotherapy -Improved, not completely resolved, ANC 1.3 today.  Continue monitoring  6 Palmer dryness and hyperpigmentation -Secondary to Xeloda -He will continue Eucerin and use hydrocortisone PRN  7. Financial support -He would like to see a Music therapist -He is getting billed from his stay in [facility] when he first was diagnosed and has not been back to work due to his treatment.  -I guided him to finical advisor, and if needed I will provide him with a Education officer, museum referral    PLAN: -Provided return to work letter for patient  -Start OTC ferrous sulfate  -Refill pain medication with Ebony Hail from rad/onc today  -Plan to proceed with surgery on 12/19 -Lab and f/u on 1/3 or 1/4 to discuss adjuvant chemo     No orders of the defined types were placed in this encounter.  All questions were answered. The patient knows to call the clinic with any problems, questions or concerns.  Truitt Merle, MD 09/03/2017 11:58 AM   This document serves as a record of services personally performed by Truitt Merle, MD. It was created on her behalf by Joslyn Devon, a trained medical scribe. The creation of this record is based on the scribe's personal observations and the provider's statements to them.     I have reviewed the above documentation for accuracy and completeness, and I agree with the above.

## 2017-09-03 ENCOUNTER — Encounter: Payer: Self-pay | Admitting: Hematology

## 2017-09-03 ENCOUNTER — Other Ambulatory Visit: Payer: Self-pay | Admitting: Radiation Oncology

## 2017-09-03 ENCOUNTER — Encounter: Payer: Self-pay | Admitting: Radiation Oncology

## 2017-09-03 ENCOUNTER — Other Ambulatory Visit (HOSPITAL_BASED_OUTPATIENT_CLINIC_OR_DEPARTMENT_OTHER): Payer: BLUE CROSS/BLUE SHIELD

## 2017-09-03 ENCOUNTER — Ambulatory Visit (HOSPITAL_BASED_OUTPATIENT_CLINIC_OR_DEPARTMENT_OTHER): Payer: BLUE CROSS/BLUE SHIELD | Admitting: Hematology

## 2017-09-03 VITALS — BP 133/89 | HR 68 | Temp 98.4°F | Resp 17 | Ht 73.0 in | Wt 192.1 lb

## 2017-09-03 DIAGNOSIS — K5900 Constipation, unspecified: Secondary | ICD-10-CM | POA: Diagnosis not present

## 2017-09-03 DIAGNOSIS — C20 Malignant neoplasm of rectum: Secondary | ICD-10-CM

## 2017-09-03 DIAGNOSIS — Z923 Personal history of irradiation: Secondary | ICD-10-CM | POA: Diagnosis not present

## 2017-09-03 DIAGNOSIS — D701 Agranulocytosis secondary to cancer chemotherapy: Secondary | ICD-10-CM

## 2017-09-03 DIAGNOSIS — Z9221 Personal history of antineoplastic chemotherapy: Secondary | ICD-10-CM

## 2017-09-03 DIAGNOSIS — K625 Hemorrhage of anus and rectum: Secondary | ICD-10-CM

## 2017-09-03 DIAGNOSIS — R110 Nausea: Secondary | ICD-10-CM | POA: Diagnosis not present

## 2017-09-03 DIAGNOSIS — E611 Iron deficiency: Secondary | ICD-10-CM | POA: Diagnosis not present

## 2017-09-03 DIAGNOSIS — Z809 Family history of malignant neoplasm, unspecified: Secondary | ICD-10-CM

## 2017-09-03 DIAGNOSIS — K6289 Other specified diseases of anus and rectum: Secondary | ICD-10-CM

## 2017-09-03 DIAGNOSIS — D500 Iron deficiency anemia secondary to blood loss (chronic): Secondary | ICD-10-CM

## 2017-09-03 DIAGNOSIS — L818 Other specified disorders of pigmentation: Secondary | ICD-10-CM

## 2017-09-03 LAB — COMPREHENSIVE METABOLIC PANEL
ALBUMIN: 4.2 g/dL (ref 3.5–5.0)
ALK PHOS: 75 U/L (ref 40–150)
ALT: 48 U/L (ref 0–55)
ANION GAP: 6 meq/L (ref 3–11)
AST: 51 U/L — ABNORMAL HIGH (ref 5–34)
BILIRUBIN TOTAL: 0.63 mg/dL (ref 0.20–1.20)
BUN: 9.5 mg/dL (ref 7.0–26.0)
CO2: 27 meq/L (ref 22–29)
CREATININE: 0.8 mg/dL (ref 0.7–1.3)
Calcium: 9.2 mg/dL (ref 8.4–10.4)
Chloride: 105 mEq/L (ref 98–109)
GLUCOSE: 93 mg/dL (ref 70–140)
Potassium: 3.9 mEq/L (ref 3.5–5.1)
SODIUM: 139 meq/L (ref 136–145)
TOTAL PROTEIN: 7.4 g/dL (ref 6.4–8.3)

## 2017-09-03 LAB — CBC WITH DIFFERENTIAL/PLATELET
BASO%: 0.4 % (ref 0.0–2.0)
BASOS ABS: 0 10*3/uL (ref 0.0–0.1)
EOS%: 7.5 % — AB (ref 0.0–7.0)
Eosinophils Absolute: 0.2 10*3/uL (ref 0.0–0.5)
HEMATOCRIT: 34.4 % — AB (ref 38.4–49.9)
HGB: 11.4 g/dL — ABNORMAL LOW (ref 13.0–17.1)
LYMPH#: 0.5 10*3/uL — AB (ref 0.9–3.3)
LYMPH%: 20.6 % (ref 14.0–49.0)
MCH: 29.5 pg (ref 27.2–33.4)
MCHC: 33.1 g/dL (ref 32.0–36.0)
MCV: 88.9 fL (ref 79.3–98.0)
MONO#: 0.3 10*3/uL (ref 0.1–0.9)
MONO%: 14 % (ref 0.0–14.0)
NEUT#: 1.3 10*3/uL — ABNORMAL LOW (ref 1.5–6.5)
NEUT%: 57.5 % (ref 39.0–75.0)
Platelets: 187 10*3/uL (ref 140–400)
RBC: 3.87 10*6/uL — AB (ref 4.20–5.82)
RDW: 17.6 % — ABNORMAL HIGH (ref 11.0–14.6)
WBC: 2.3 10*3/uL — ABNORMAL LOW (ref 4.0–10.3)

## 2017-09-03 MED ORDER — OXYCODONE HCL 5 MG PO TABS: 5.0000 mg | ORAL_TABLET | ORAL | 0 refills | Status: DC | PRN

## 2017-09-03 NOTE — Progress Notes (Signed)
Radiation Oncology         [phone] ________________________________  Name: Paul [name] MRN: [id number]  Date of Service: 09/03/2017 DOB: 15-Jan-1966  Post Treatment Note  CC: Eulas Post, MD  No ref. provider found  Diagnosis:  Stage IIIB cT3, cN1, cM0 adenocarcinoma of the rectum  Interval Since Last Radiation: 2  weeks   9/26-11/2/18:  45 Gy in 25 fractions to rectum  5.4 Gy boost in 3 fractions   Narrative:  The patient returns today for routine follow-up. In summary this is a pleasant 51 y.o. male who has a history of Stage IIIB adenocarcinoma of the rectum. He completed radiotherapy on 08/24/17. He's planning to return on 09/20/17 for his 1 month follow up with me. He has had pain in the rectum following a bowel movement and has been taking Oxycodone 5mg  tablets 1-2 tablets at a time daily. This has relieved his pain.                       On review of systems, the patient states he continues to have difficulties with pain following a bowel movement. He denies any rectal bleeding. He has some loose stool and other times where this is firm. He denies any abdominal pain.   ALLERGIES:  is allergic to chloroquine.  Meds: Current Outpatient Medications  Medication Sig Dispense Refill  . amLODipine (NORVASC) 5 MG tablet TAKE 1 TABLET DAILY 90 tablet 1  . calcium carbonate (OS-CAL - DOSED IN MG OF ELEMENTAL CALCIUM) 1250 (500 Ca) MG tablet Take 1 tablet by mouth daily as needed (for bones).    . capecitabine (XELODA) 500 MG tablet Take 4 tabs (2000mg ) in AM & 3 tabs (1500mg ) in PM, within 70min of meals, days of radiation only, M-F (Patient not taking: Reported on 09/03/2017) 56 tablet 0  . cetirizine (ZYRTEC) 10 MG tablet Take 10 mg by mouth daily as needed for allergies.    . Garlic 2 MG CAPS Take 2 mg by mouth daily.    Marland Kitchen HYDROcodone-acetaminophen (NORCO) 5-325 MG tablet Take 1-2 tablets by mouth every 6 (six) hours as needed for moderate pain. 60 tablet 0  .  hydrocortisone (ANUSOL-HC) 25 MG suppository Place 25 mg rectally 2 (two) times daily. Take 1 suppository 2x day for 1 week 1 box (24 in a box)[facility] [address]    . Multiple Vitamin (MULTIVITAMIN WITH MINERALS) TABS tablet Take 1 tablet by mouth daily.    Marland Kitchen omeprazole (PRILOSEC) 40 MG capsule Take 40 mg by mouth daily as needed (heart burn).     . ondansetron (ZOFRAN) 8 MG tablet Take 1 tablet (8 mg total) by mouth every 8 (eight) hours as needed for nausea or vomiting. 30 tablet 3  . prochlorperazine (COMPAZINE) 10 MG tablet Take 1 tablet (10 mg total) by mouth every 6 (six) hours as needed for nausea or vomiting. (Patient not taking: Reported on 08/13/2017) 30 tablet 2  . psyllium (METAMUCIL) 58.6 % powder Take 1 packet by mouth 3 (three) times daily as needed.    . traMADol (ULTRAM) 50 MG tablet Take 50 mg every 6 (six) hours as needed by mouth.  1   No current facility-administered medications for this visit.     Physical Findings: In general this is a well appearing African male in no acute distress. He's alert and oriented x4 and appropriate throughout the examination. Cardiopulmonary assessment is negative for acute distress and he exhibits normal effort.  Lab Findings: Lab Results  Component Value Date   WBC 2.3 (L) 09/03/2017   HGB 11.4 (L) 09/03/2017   HCT 34.4 (L) 09/03/2017   MCV 88.9 09/03/2017   PLT 187 09/03/2017     Radiographic Findings: No results found.  Impression/Plan: 1. Stage IIIB cT3, cN1, cM0 adenocarcinoma of the rectum with pain. The patient continues with pain secondary to radiotherapy induced side effects. We discussed the plans to give a new prescription for oxycodone. We will see how he's feeling but would expect that by the end of the month that his symptoms have resolved.     Carola Rhine, PAC

## 2017-09-03 NOTE — Progress Notes (Signed)
Patient came in to discuss financial assistance. Patient is insured and states he still has a lot of bills. Patient has Access [employer] but is experiencing financial hardship with making requested monthly payment due to reduction in hours. Advised patient to contact patient accounting to discuss arrangements and to make some type of payment if he is able to. Patient verbalized understanding. Asked patient if he would like to apply for Medicaid. He states he didn't know that he could. Gave patient an application and advised what is needed to go with the application and he can return here for me to fax or at [place]. Patient verbalized understanding and has my card for any additional financial questions or concerns.

## 2017-09-04 ENCOUNTER — Telehealth: Payer: Self-pay | Admitting: Hematology

## 2017-09-04 NOTE — Telephone Encounter (Signed)
Called patient regarding current schedule °

## 2017-09-07 NOTE — Progress Notes (Signed)
  Radiation Oncology         [phone]) [phone] ________________________________  Name: Paul [name] MRN: [id number]  Date: 08/24/2017  DOB: 03-29-1966  End of Treatment Note  Diagnosis:   51 y.o. male with Stage IIIB, cT3N1M0 adenocarcinoma of the rectum     Indication for treatment::  Curative       Radiation treatment dates:   07/18/2017 - 08/24/2017  Site/dose:   The rectum was treated to 45 Gy in 25 fractions of 1.8 Gy, followed by a 5.4 Gy boost in 3 fractions to yield a total dose of 50.4 Gy.  Narrative: The patient tolerated radiation treatment relatively well.   He had rectal bleeding with clots that was bright red after bowel movements. He reported loose stools that were painful. The skin to the radiation site was irritated, but no skin breakage was noted.  Plan: The patient has completed radiation treatment. He has continued to notice bleeding with bowel movements. Hopefully this will subside following treatment, but he will let us know if this persists. He may need to see GI medicine again if he has worsening or continued bleeding. Otherwise the patient will return to radiation oncology clinic for routine followup in one month. I advised the patient to call or return sooner if they have any questions or concerns related to their recovery or treatment. ________________________________  ------------------------------------------------  Jodelle Gross, MD, PhD  This document serves as a record of services personally performed by Kyung Rudd, MD. It was created on his behalf by Rae Lips, a trained medical scribe. The creation of this record is based on the scribe's personal observations and the provider's statements to them. This document has been checked and approved by the attending provider.

## 2017-09-11 ENCOUNTER — Other Ambulatory Visit: Payer: Self-pay | Admitting: *Deleted

## 2017-09-18 ENCOUNTER — Ambulatory Visit: Payer: Self-pay | Admitting: Surgery

## 2017-09-18 ENCOUNTER — Ambulatory Visit: Payer: Self-pay | Admitting: Radiation Oncology

## 2017-09-18 NOTE — H&P (View-Only) (Signed)
Paul [name] 09/18/2017 8:35 AM Location: [facility] Patient #: [id number] DOB: Sep 24, 1966 Married / Language: English / Race: Black or African American Male  History of Present Illness [name] MD; 09/18/2017 8:59 AM) The patient is a 51 year old male who presents with colorectal cancer. Note for "Colorectal cancer": ` ` ` Patient sent for surgical consultation at the request of Dr. Paulita Fujita  Chief Complaint: New rectal cancer  Patient returns today. He completed neoadjuvant chemoradiation therapy earlier this month, to November 2018. He is scheduled for surgery on 10/10/2017, 7.5 weeks later, trying to get it done before the end of the year.  He is feeling better. Less pain. No more cramping. Moving his bowels more easily. Appetite good. Energy level coming back. He comes today by himself.   The patient is a pleasant gentleman originally from [place]. Bilingual in [place]. He comes in today by himself. Unfortunately notice some bowel changes with cramping and diarrhea. Came much more severe. Was admitted the hospital. CAT scan revealed bulky mid rectal mass. Gastroenterology was consulted. Colonoscopy by Dr. Paulita Fujita suspected tumor. Biopsy consistent with adenocarcinoma. MRI pelvis noticed it as T3 N1. I thought it was 5-6 centimeters from anal verge. Looks more like 7-8 centimeters to May. Surgical consultation requested. Seen as an inpatient by Dr. Brantley Stage, my partner. Referred to me for my minimally invasive colorectal expertise.  No personal nor family history of other GI/colon cancer, inflammatory bowel disease, irritable bowel syndrome, allergy such as Celiac Sprue, dietary/dairy problems, colitis, ulcers nor gastritis. No recent sick contacts/gastroenteritis. No travel outside the country. No changes in diet. No dysphagia to solids or liquids. No significant heartburn or reflux. No hematochezia, hematemesis, coffee ground emesis.  No evidence of prior gastric/peptic ulceration. Patient usually moves his bowels a few times a day. Her injuries. He does not smoke. He does not recall any family history of any bowel problems. No cardiac or pulmonary issues.   (Review of systems as stated in this history (HPI) or in the review of systems. Otherwise all other 12 point ROS are negative)   Oncology History Cancer Staging <epic:EPIC?ACTIVITY&MR_ONC_STAGING_LINK> Rectal adenocarcinoma ([place]) Staging form: Colon and Rectum, AJCC 8th Edition - Clinical stage from 07/06/2017: Stage IIIB (cT3, cN1, cM0) - Signed by [name], NP on 07/10/2017   Rectal adenocarcinoma ([place]) 07/03/2017 Imaging CT ABD PELVIS W CONTRAST IMPRESSION: Possible mass in the rectum compatible carcinoma. Endoscopy recommended for further evaluation. Otherwise negative.   07/03/2017 Tumor Marker CEA 2.7   07/05/2017 Initial Biopsy Rectum biopsy -invasive adenocarcinoma, poorly differentiated   07/05/2017 Procedure Colonoscopy Per Dr. Paulita Fujita Findings: The perianal and digital rectal examinations were normal. Internal hemorrhoids were found during retroflexion. The hemorrhoids were moderate. No additional abnormalities were found on retroflexion. A fungating, sessile and ulcerated partially obstructing large mass was found in the recto-sigmoid colon.The mass was partially circumferential (involving two-thirds of the lumen circumference). Oozing was present. Area was successfully injected with 2 mL [place] ink for tattooing. This was biopsied with a cold forceps for histology.   07/05/2017 Initial Diagnosis Rectal adenocarcinoma ([place])   07/06/2017 Imaging Staging MRI ABD/PELVIS revealed clinical stage T3N1M0, IIIb   07/18/2017 - 08/24/2017 Radiation Therapy The patient began radiation therapy on 07/18/17 supervised by Dr Lisbeth Renshaw. Plan to complete on 08/24/17   07/18/2017 Imaging CT CHEST IMPRESSION: Negative. No evidence of thoracic metastatic  disease or other significant abnormality.   07/18/2017 - 08/24/2017 Chemotherapy Xeloda 2019m am and 15065mpm every 12  hours, with concurrent radiation   08/27/2017 Genetic Testing AXIN2 c.1448C>G (p.Pro483Arg) VUS identified on the common hereditary cancer panel. The Hereditary Gene Panel offered by [facility] includes sequencing and/or deletion duplication testing of the following 47 genes: APC, ATM, AXIN2, BARD1, BMPR1A, BRCA1, BRCA2, BRIP1, CDH1, CDK4, CDKN2A (p14ARF), CDKN2A (p16INK4a), CHEK2, CTNNA1, DICER1, EPCAM (Deletion/duplication testing only), GREM1 (promoter region deletion/duplication testing only), KIT, MEN1, MLH1, MSH2, MSH3, MSH6, MUTYH, NBN, NF1, NHTL1, PALB2, PDGFRA, PMS2, POLD1, POLE, PTEN, RAD50, RAD51C, RAD51D, SDHB, SDHC, SDHD, SMAD4, SMARCA4. STK11, TP53, TSC1, TSC2, and VHL. The following genes were evaluated for sequence changes only: SDHA and HOXB13 c.251G>A variant only. The report date is August 27, 2017.    Problem List/Past Medical [name], MD; 09/18/2017 8:45 AM) RECTAL ADENOCARCINOMA (C20) PREOP COLON - ENCOUNTER FOR PREOPERATIVE EXAMINATION FOR GENERAL SURGICAL PROCEDURE (X90.240)  Past Surgical History [name], MD; 09/18/2017 8:45 AM) No pertinent past surgical history  Diagnostic Studies History [name], MD; 09/18/2017 8:45 AM) Colonoscopy within last year  Allergies Malachy Moan, RMA; 09/18/2017 8:35 AM) No Known Allergies 07/09/2017  Medication History Malachy Moan, RMA; 09/18/2017 8:35 AM) AmLODIPine Besylate (5MG Tablet, Oral) Active. Calcium Carbonate (1250 (500 Ca)MG Tablet, Oral) Active. Cetirizine HCl (10MG Tablet, Oral) Active. Multiple Vitamins-Minerals (Oral) Active. Omeprazole (40MG Capsule DR, Oral) Active. Medications Reconciled  Social History [name], MD; 09/18/2017 8:45 AM) Caffeine use Carbonated beverages, Coffee, Tea. No alcohol use No drug use Tobacco use  Never smoker. Originally from [place], [place]  Family History [name], MD; 09/18/2017 8:45 AM) Prostate Cancer Father.  Other Problems [name], MD; 09/18/2017 8:45 AM) Back Pain Gastroesophageal Reflux Disease High blood pressure Rectal Cancer     Review of Systems [name], MD; 09/18/2017 8:45 AM) General Not Present- Appetite Loss, Chills, Fatigue, Fever, Night Sweats, Weight Gain and Weight Loss. Skin Not Present- Change in Wart/Mole, Dryness, Hives, Jaundice, New Lesions, Non-Healing Wounds, Rash and Ulcer. HEENT Present- Seasonal Allergies. Not Present- Earache, Hearing Loss, Hoarseness, Nose Bleed, Oral Ulcers, Ringing in the Ears, Sinus Pain, Sore Throat, Visual Disturbances, Wears glasses/contact lenses and Yellow Eyes. Respiratory Present- Snoring. Not Present- Bloody sputum, Chronic Cough, Difficulty Breathing and Wheezing. Breast Not Present- Breast Mass, Breast Pain, Nipple Discharge and Skin Changes. Cardiovascular Not Present- Chest Pain, Difficulty Breathing Lying Down, Leg Cramps, Palpitations, Rapid Heart Rate, Shortness of Breath and Swelling of Extremities. Gastrointestinal Present- Excessive gas. Not Present- Abdominal Pain, Bloating, Bloody Stool, Change in Bowel Habits, Chronic diarrhea, Constipation, Difficulty Swallowing, Gets full quickly at meals, Hemorrhoids, Indigestion, Nausea, Rectal Pain and Vomiting. Male Genitourinary Present- Frequency. Not Present- Blood in Urine, Change in Urinary Stream, Impotence, Nocturia, Painful Urination, Urgency and Urine Leakage. Musculoskeletal Not Present- Back Pain, Joint Pain, Joint Stiffness, Muscle Pain, Muscle Weakness and Swelling of Extremities. Neurological Not Present- Decreased Memory, Fainting, Headaches, Numbness, Seizures, Tingling, Tremor, Trouble walking and Weakness. Psychiatric Not Present- Anxiety, Bipolar, Change in Sleep Pattern, Depression, Fearful and Frequent  crying. Endocrine Not Present- Cold Intolerance, Excessive Hunger, Hair Changes, Heat Intolerance and New Diabetes. Hematology Not Present- Blood Thinners, Easy Bruising, Excessive bleeding, Gland problems, HIV and Persistent Infections.  Vitals Malachy Moan RMA; 09/18/2017 8:36 AM) 09/18/2017 8:35 AM Weight: 193.4 lb Height: 73in Body Surface Area: 2.12 m Body Mass Index: 25.52 kg/m  Temp.: 97.75F  Pulse: 64 (Regular)  BP: 134/80 (Sitting, Left Arm, Standard)      Physical Exam [name] MD; 09/18/2017 8:56 AM)  General Mental Status-Alert. General Appearance-Not in acute distress, Not Sickly. Orientation-Oriented X3. Hydration-Well hydrated. Voice-Normal.  Integumentary Global Assessment Normal Exam - Axillae: non-tender, no inflammation or ulceration, no drainage. and Distribution of scalp and body hair is normal. General Characteristics Temperature - normal warmth is noted.  Head and Neck Head-normocephalic, atraumatic with no lesions or palpable masses. Face Global Assessment - atraumatic, no absence of expression. Neck Global Assessment - no abnormal movements, no bruit auscultated on the right, no bruit auscultated on the left, no decreased range of motion, non-tender. Trachea-midline. Thyroid Gland Characteristics - non-tender.  Eye Eyeball - Left-Extraocular movements intact, No Nystagmus. Eyeball - Right-Extraocular movements intact, No Nystagmus. Cornea - Left-No Hazy. Cornea - Right-No Hazy. Sclera/Conjunctiva - Left-No scleral icterus, No Discharge. Sclera/Conjunctiva - Right-No scleral icterus, No Discharge. Pupil - Left-Direct reaction to light normal. Pupil - Right-Direct reaction to light normal.  ENMT Ears Pinna - Left - no drainage observed, no generalized tenderness observed. Right - no drainage observed, no generalized tenderness observed. Nose and Sinuses Nose - no destructive lesion  observed. Nares - Left - quiet respiration. Right - quiet respiration. Mouth and Throat Lips - Upper Lip - no fissures observed, no pallor noted. Lower Lip - no fissures observed, no pallor noted. Nasopharynx - no discharge present. Oral Cavity/Oropharynx - Tongue - no dryness observed. Oral Mucosa - no cyanosis observed. Hypopharynx - no evidence of airway distress observed.  Chest and Lung Exam Inspection Movements - Normal and Symmetrical. Accessory muscles - No use of accessory muscles in breathing. Palpation Normal exam - Non-tender. Auscultation Breath sounds - Normal and Clear.  Cardiovascular Auscultation Rhythm - Regular. Murmurs & Other Heart Sounds - Normal exam - No Murmurs and No Systolic Clicks.  Abdomen Inspection Normal Exam - No Visible peristalsis and No Abnormal pulsations. Umbilicus - No Bleeding, No Urine drainage. Palpation/Percussion Normal exam - Soft, Non Tender, No Rebound tenderness, No Rigidity (guarding) and No Cutaneous hyperesthesia. Note: Abdomen soft. Nontender. Not distended. No umbilical or incisional hernias. No guarding.  Male Genitourinary Sexual Maturity Tanner 5 - Adult hair pattern and Adult penile size and shape. Note: No inguinal hernias. Normal external genitalia. Epididymi, testes, and spermatic cords normal without any masses.  Rectal Note: Perianal skin clean with good hygiene. No pruritis ani. No pilonidal disease. No fissure. No abscess/fistula. Normal sphincter tone.  No external hemorrhoids. No condyloma warts.  Held off on repeat digital exam since rectosigmoid and not felt on initial examination  Peripheral Vascular Upper Extremity Inspection - Left - No Cyanotic nailbeds, Not Ischemic. Right - No Cyanotic nailbeds, Not Ischemic.  Neurologic Neurologic evaluation reveals -normal attention span and ability to concentrate, able to name objects and repeat phrases. Appropriate fund of knowledge , normal  sensation and normal coordination. Mental Status Affect - not angry, not paranoid. Cranial Nerves-Normal Bilaterally. Gait-Normal.  Neuropsychiatric Mental status exam performed with findings of-able to articulate well with normal speech/language, rate, volume and coherence, thought content normal with ability to perform basic computations and apply abstract reasoning and no evidence of hallucinations, delusions, obsessions or homicidal/suicidal ideation.  Musculoskeletal Global Assessment Spine, Ribs and Pelvis - no instability, subluxation or laxity. Right Upper Extremity - no instability, subluxation or laxity.  Lymphatic Head & Neck General Head & Neck Lymphatics: Bilateral - Description - No Localized lymphadenopathy. Axillary General Axillary Region: Bilateral - Description - No Localized lymphadenopathy. Femoral & Inguinal Generalized Femoral & Inguinal Lymphatics: Left: Right - Description - No Localized lymphadenopathy. Description - No  Localized lymphadenopathy.    Assessment & Plan [name] MD; 09/18/2017 9:16 AM)  PREOP COLON - ENCOUNTER FOR PREOPERATIVE EXAMINATION FOR GENERAL SURGICAL PROCEDURE (Z01.818) Impression: He acted like he did not have any the bowel prep further instructions despite our visit last time. I went over them again. We will go over the bowel prep again. We will have him go to the [facility] preoperative clinic visit  Current Plans Written instructions provided Pt Education - [facility] Colon Bowel Prep 2018 [facility]/Miralax/Antibiotics Restarted Neomycin Sulfate 500MG, 2 (two) Tablet SEE NOTE, #6, 09/18/2017, No Refill. Local Order: TAKE TWO TABLETS AT 2 PM, 3 PM, AND 10 PM THE DAY PRIOR TO SURGERY Restarted Flagyl 500MG, 2 (two) Tablet SEE NOTE, #6, 09/18/2017, No Refill. Local Order: Take at 2pm, 3pm, and 10pm the day prior to your colon operation Pt Education - Pamphlet Given - Laparoscopic Colorectal Surgery: discussed with patient and provided  information. RECTAL ADENOCARCINOMA (C20) Impression: Rectal cancer not palpable by digital exam. [id number]  Recovering from neoadjuvant chemoradiation therapy.  MRI calls it 5-6 centimeters from anal verge. I think that's too low. Colonoscopy felt this was rectosigmoid. I would put more around 8 cm at the most by MRI. I usually can feel digitally lesions up to 9cm. CT scan sinks is is more rectosigmoid. We will see. We will do rigid proctoscopy at the beginning of the case and mark with tattoo inking. If I can get the anastomosis greater than 5 cm, weight hold off on loop ileal diversion. I more optimistic that that will happen but we will see  He was nervous and felt like he needed a repeat CAT scan. I noted that it was only 2 months ago without metastatic disease. He is clinically improving with less cramping and easier bowels. That is reassuring that the tumor shrinking. I do not think he would alter my plan. The purpose of the surgery is to see if things are smaller improve.  Again went over the technique. Minimally invasive robotic approach. Have him see the [facility]AS preop protocol. Handouts given. Bowel prep given.  Current Plans The anatomy & physiology of the digestive tract was discussed. The pathophysiology of the colon was discussed. Natural history risks without surgery was discussed. I feel the risks of no intervention will lead to serious problems that outweigh the operative risks; therefore, I recommended a partial colectomy to remove the pathology. Minimally invasive (Robotic/Laparoscopic) & open techniques were discussed.  Risks such as bleeding, infection, abscess, leak, reoperation, possible ostomy, hernia, heart attack, death, and other risks were discussed. I noted a good likelihood this will help address the problem. Goals of post-operative recovery were discussed as well. Need for adequate nutrition, daily bowel regimen and healthy physical activity, to optimize recovery was  noted as well. We will work to minimize complications. Educational materials were available as well. Questions were answered. The patient expresses understanding & wishes to proceed with surgery.  Pt Education - [facility] Colectomy post-op instructions: discussed with patient and provided information.  SAdin Hector M.D., F.A.C.S. [facility] [address] [place] [phone] / Paging

## 2017-09-18 NOTE — H&P (Signed)
Paul [name] 09/18/2017 8:35 AM Location: [facility] Patient #: [id number] DOB: Sep 24, 1966 Married / Language: English / Race: Black or African American Male  History of Present Illness [name] MD; 09/18/2017 8:59 AM) The patient is a 51 year old male who presents with colorectal cancer. Note for "Colorectal cancer": ` ` ` Patient sent for surgical consultation at the request of Dr. Paulita Fujita  Chief Complaint: New rectal cancer  Patient returns today. He completed neoadjuvant chemoradiation therapy earlier this month, to November 2018. He is scheduled for surgery on 10/10/2017, 7.5 weeks later, trying to get it done before the end of the year.  He is feeling better. Less pain. No more cramping. Moving his bowels more easily. Appetite good. Energy level coming back. He comes today by himself.   The patient is a pleasant gentleman originally from [place]. Bilingual in [place]. He comes in today by himself. Unfortunately notice some bowel changes with cramping and diarrhea. Came much more severe. Was admitted the hospital. CAT scan revealed bulky mid rectal mass. Gastroenterology was consulted. Colonoscopy by Dr. Paulita Fujita suspected tumor. Biopsy consistent with adenocarcinoma. MRI pelvis noticed it as T3 N1. I thought it was 5-6 centimeters from anal verge. Looks more like 7-8 centimeters to May. Surgical consultation requested. Seen as an inpatient by Dr. Brantley Stage, my partner. Referred to me for my minimally invasive colorectal expertise.  No personal nor family history of other GI/colon cancer, inflammatory bowel disease, irritable bowel syndrome, allergy such as Celiac Sprue, dietary/dairy problems, colitis, ulcers nor gastritis. No recent sick contacts/gastroenteritis. No travel outside the country. No changes in diet. No dysphagia to solids or liquids. No significant heartburn or reflux. No hematochezia, hematemesis, coffee ground emesis.  No evidence of prior gastric/peptic ulceration. Patient usually moves his bowels a few times a day. Her injuries. He does not smoke. He does not recall any family history of any bowel problems. No cardiac or pulmonary issues.   (Review of systems as stated in this history (HPI) or in the review of systems. Otherwise all other 12 point ROS are negative)   Oncology History Cancer Staging <epic:EPIC?ACTIVITY&MR_ONC_STAGING_LINK> Rectal adenocarcinoma ([place]) Staging form: Colon and Rectum, AJCC 8th Edition - Clinical stage from 07/06/2017: Stage IIIB (cT3, cN1, cM0) - Signed by [name], NP on 07/10/2017   Rectal adenocarcinoma ([place]) 07/03/2017 Imaging CT ABD PELVIS W CONTRAST IMPRESSION: Possible mass in the rectum compatible carcinoma. Endoscopy recommended for further evaluation. Otherwise negative.   07/03/2017 Tumor Marker CEA 2.7   07/05/2017 Initial Biopsy Rectum biopsy -invasive adenocarcinoma, poorly differentiated   07/05/2017 Procedure Colonoscopy Per Dr. Paulita Fujita Findings: The perianal and digital rectal examinations were normal. Internal hemorrhoids were found during retroflexion. The hemorrhoids were moderate. No additional abnormalities were found on retroflexion. A fungating, sessile and ulcerated partially obstructing large mass was found in the recto-sigmoid colon.The mass was partially circumferential (involving two-thirds of the lumen circumference). Oozing was present. Area was successfully injected with 2 mL [place] ink for tattooing. This was biopsied with a cold forceps for histology.   07/05/2017 Initial Diagnosis Rectal adenocarcinoma ([place])   07/06/2017 Imaging Staging MRI ABD/PELVIS revealed clinical stage T3N1M0, IIIb   07/18/2017 - 08/24/2017 Radiation Therapy The patient began radiation therapy on 07/18/17 supervised by Dr Lisbeth Renshaw. Plan to complete on 08/24/17   07/18/2017 Imaging CT CHEST IMPRESSION: Negative. No evidence of thoracic metastatic  disease or other significant abnormality.   07/18/2017 - 08/24/2017 Chemotherapy Xeloda 2019m am and 15065mpm every 12  hours, with concurrent radiation   08/27/2017 Genetic Testing AXIN2 c.1448C>G (p.Pro483Arg) VUS identified on the common hereditary cancer panel. The Hereditary Gene Panel offered by [facility] includes sequencing and/or deletion duplication testing of the following 47 genes: APC, ATM, AXIN2, BARD1, BMPR1A, BRCA1, BRCA2, BRIP1, CDH1, CDK4, CDKN2A (p14ARF), CDKN2A (p16INK4a), CHEK2, CTNNA1, DICER1, EPCAM (Deletion/duplication testing only), GREM1 (promoter region deletion/duplication testing only), KIT, MEN1, MLH1, MSH2, MSH3, MSH6, MUTYH, NBN, NF1, NHTL1, PALB2, PDGFRA, PMS2, POLD1, POLE, PTEN, RAD50, RAD51C, RAD51D, SDHB, SDHC, SDHD, SMAD4, SMARCA4. STK11, TP53, TSC1, TSC2, and VHL. The following genes were evaluated for sequence changes only: SDHA and HOXB13 c.251G>A variant only. The report date is August 27, 2017.    Problem List/Past Medical [name], MD; 09/18/2017 8:45 AM) RECTAL ADENOCARCINOMA (C20) PREOP COLON - ENCOUNTER FOR PREOPERATIVE EXAMINATION FOR GENERAL SURGICAL PROCEDURE (X90.240)  Past Surgical History [name], MD; 09/18/2017 8:45 AM) No pertinent past surgical history  Diagnostic Studies History [name], MD; 09/18/2017 8:45 AM) Colonoscopy within last year  Allergies Malachy Moan, RMA; 09/18/2017 8:35 AM) No Known Allergies 07/09/2017  Medication History Malachy Moan, RMA; 09/18/2017 8:35 AM) AmLODIPine Besylate (5MG Tablet, Oral) Active. Calcium Carbonate (1250 (500 Ca)MG Tablet, Oral) Active. Cetirizine HCl (10MG Tablet, Oral) Active. Multiple Vitamins-Minerals (Oral) Active. Omeprazole (40MG Capsule DR, Oral) Active. Medications Reconciled  Social History [name], MD; 09/18/2017 8:45 AM) Caffeine use Carbonated beverages, Coffee, Tea. No alcohol use No drug use Tobacco use  Never smoker. Originally from [place], [place]  Family History [name], MD; 09/18/2017 8:45 AM) Prostate Cancer Father.  Other Problems [name], MD; 09/18/2017 8:45 AM) Back Pain Gastroesophageal Reflux Disease High blood pressure Rectal Cancer     Review of Systems [name], MD; 09/18/2017 8:45 AM) General Not Present- Appetite Loss, Chills, Fatigue, Fever, Night Sweats, Weight Gain and Weight Loss. Skin Not Present- Change in Wart/Mole, Dryness, Hives, Jaundice, New Lesions, Non-Healing Wounds, Rash and Ulcer. HEENT Present- Seasonal Allergies. Not Present- Earache, Hearing Loss, Hoarseness, Nose Bleed, Oral Ulcers, Ringing in the Ears, Sinus Pain, Sore Throat, Visual Disturbances, Wears glasses/contact lenses and Yellow Eyes. Respiratory Present- Snoring. Not Present- Bloody sputum, Chronic Cough, Difficulty Breathing and Wheezing. Breast Not Present- Breast Mass, Breast Pain, Nipple Discharge and Skin Changes. Cardiovascular Not Present- Chest Pain, Difficulty Breathing Lying Down, Leg Cramps, Palpitations, Rapid Heart Rate, Shortness of Breath and Swelling of Extremities. Gastrointestinal Present- Excessive gas. Not Present- Abdominal Pain, Bloating, Bloody Stool, Change in Bowel Habits, Chronic diarrhea, Constipation, Difficulty Swallowing, Gets full quickly at meals, Hemorrhoids, Indigestion, Nausea, Rectal Pain and Vomiting. Male Genitourinary Present- Frequency. Not Present- Blood in Urine, Change in Urinary Stream, Impotence, Nocturia, Painful Urination, Urgency and Urine Leakage. Musculoskeletal Not Present- Back Pain, Joint Pain, Joint Stiffness, Muscle Pain, Muscle Weakness and Swelling of Extremities. Neurological Not Present- Decreased Memory, Fainting, Headaches, Numbness, Seizures, Tingling, Tremor, Trouble walking and Weakness. Psychiatric Not Present- Anxiety, Bipolar, Change in Sleep Pattern, Depression, Fearful and Frequent  crying. Endocrine Not Present- Cold Intolerance, Excessive Hunger, Hair Changes, Heat Intolerance and New Diabetes. Hematology Not Present- Blood Thinners, Easy Bruising, Excessive bleeding, Gland problems, HIV and Persistent Infections.  Vitals Malachy Moan RMA; 09/18/2017 8:36 AM) 09/18/2017 8:35 AM Weight: 193.4 lb Height: 73in Body Surface Area: 2.12 m Body Mass Index: 25.52 kg/m  Temp.: 97.75F  Pulse: 64 (Regular)  BP: 134/80 (Sitting, Left Arm, Standard)      Physical Exam [name] MD; 09/18/2017 8:56 AM)  General Mental Status-Alert. General Appearance-Not in acute distress, Not Sickly. Orientation-Oriented X3. Hydration-Well hydrated. Voice-Normal.  Integumentary Global Assessment Normal Exam - Axillae: non-tender, no inflammation or ulceration, no drainage. and Distribution of scalp and body hair is normal. General Characteristics Temperature - normal warmth is noted.  Head and Neck Head-normocephalic, atraumatic with no lesions or palpable masses. Face Global Assessment - atraumatic, no absence of expression. Neck Global Assessment - no abnormal movements, no bruit auscultated on the right, no bruit auscultated on the left, no decreased range of motion, non-tender. Trachea-midline. Thyroid Gland Characteristics - non-tender.  Eye Eyeball - Left-Extraocular movements intact, No Nystagmus. Eyeball - Right-Extraocular movements intact, No Nystagmus. Cornea - Left-No Hazy. Cornea - Right-No Hazy. Sclera/Conjunctiva - Left-No scleral icterus, No Discharge. Sclera/Conjunctiva - Right-No scleral icterus, No Discharge. Pupil - Left-Direct reaction to light normal. Pupil - Right-Direct reaction to light normal.  ENMT Ears Pinna - Left - no drainage observed, no generalized tenderness observed. Right - no drainage observed, no generalized tenderness observed. Nose and Sinuses Nose - no destructive lesion  observed. Nares - Left - quiet respiration. Right - quiet respiration. Mouth and Throat Lips - Upper Lip - no fissures observed, no pallor noted. Lower Lip - no fissures observed, no pallor noted. Nasopharynx - no discharge present. Oral Cavity/Oropharynx - Tongue - no dryness observed. Oral Mucosa - no cyanosis observed. Hypopharynx - no evidence of airway distress observed.  Chest and Lung Exam Inspection Movements - Normal and Symmetrical. Accessory muscles - No use of accessory muscles in breathing. Palpation Normal exam - Non-tender. Auscultation Breath sounds - Normal and Clear.  Cardiovascular Auscultation Rhythm - Regular. Murmurs & Other Heart Sounds - Normal exam - No Murmurs and No Systolic Clicks.  Abdomen Inspection Normal Exam - No Visible peristalsis and No Abnormal pulsations. Umbilicus - No Bleeding, No Urine drainage. Palpation/Percussion Normal exam - Soft, Non Tender, No Rebound tenderness, No Rigidity (guarding) and No Cutaneous hyperesthesia. Note: Abdomen soft. Nontender. Not distended. No umbilical or incisional hernias. No guarding.  Male Genitourinary Sexual Maturity Tanner 5 - Adult hair pattern and Adult penile size and shape. Note: No inguinal hernias. Normal external genitalia. Epididymi, testes, and spermatic cords normal without any masses.  Rectal Note: Perianal skin clean with good hygiene. No pruritis ani. No pilonidal disease. No fissure. No abscess/fistula. Normal sphincter tone.  No external hemorrhoids. No condyloma warts.  Held off on repeat digital exam since rectosigmoid and not felt on initial examination  Peripheral Vascular Upper Extremity Inspection - Left - No Cyanotic nailbeds, Not Ischemic. Right - No Cyanotic nailbeds, Not Ischemic.  Neurologic Neurologic evaluation reveals -normal attention span and ability to concentrate, able to name objects and repeat phrases. Appropriate fund of knowledge , normal  sensation and normal coordination. Mental Status Affect - not angry, not paranoid. Cranial Nerves-Normal Bilaterally. Gait-Normal.  Neuropsychiatric Mental status exam performed with findings of-able to articulate well with normal speech/language, rate, volume and coherence, thought content normal with ability to perform basic computations and apply abstract reasoning and no evidence of hallucinations, delusions, obsessions or homicidal/suicidal ideation.  Musculoskeletal Global Assessment Spine, Ribs and Pelvis - no instability, subluxation or laxity. Right Upper Extremity - no instability, subluxation or laxity.  Lymphatic Head & Neck General Head & Neck Lymphatics: Bilateral - Description - No Localized lymphadenopathy. Axillary General Axillary Region: Bilateral - Description - No Localized lymphadenopathy. Femoral & Inguinal Generalized Femoral & Inguinal Lymphatics: Left: Right - Description - No Localized lymphadenopathy. Description - No  Localized lymphadenopathy.    Assessment & Plan [name] MD; 09/18/2017 9:16 AM)  PREOP COLON - ENCOUNTER FOR PREOPERATIVE EXAMINATION FOR GENERAL SURGICAL PROCEDURE (Z01.818) Impression: He acted like he did not have any the bowel prep further instructions despite our visit last time. I went over them again. We will go over the bowel prep again. We will have him go to the ERAS preoperative clinic visit  Current Plans Written instructions provided Pt Education - [facility] Colon Bowel Prep 2018 [facility]/Miralax/Antibiotics Restarted Neomycin Sulfate 500MG, 2 (two) Tablet SEE NOTE, #6, 09/18/2017, No Refill. Local Order: TAKE TWO TABLETS AT 2 PM, 3 PM, AND 10 PM THE DAY PRIOR TO SURGERY Restarted Flagyl 500MG, 2 (two) Tablet SEE NOTE, #6, 09/18/2017, No Refill. Local Order: Take at 2pm, 3pm, and 10pm the day prior to your colon operation Pt Education - Pamphlet Given - Laparoscopic Colorectal Surgery: discussed with patient and provided  information. RECTAL ADENOCARCINOMA (C20) Impression: Rectal cancer not palpable by digital exam. [id number]  Recovering from neoadjuvant chemoradiation therapy.  MRI calls it 5-6 centimeters from anal verge. I think that's too low. Colonoscopy felt this was rectosigmoid. I would put more around 8 cm at the most by MRI. I usually can feel digitally lesions up to 9cm. CT scan sinks is is more rectosigmoid. We will see. We will do rigid proctoscopy at the beginning of the case and mark with tattoo inking. If I can get the anastomosis greater than 5 cm, weight hold off on loop ileal diversion. I more optimistic that that will happen but we will see  He was nervous and felt like he needed a repeat CAT scan. I noted that it was only 2 months ago without metastatic disease. He is clinically improving with less cramping and easier bowels. That is reassuring that the tumor shrinking. I do not think he would alter my plan. The purpose of the surgery is to see if things are smaller improve.  Again went over the technique. Minimally invasive robotic approach. Have him see the ERAS preop protocol. Handouts given. Bowel prep given.  Current Plans The anatomy & physiology of the digestive tract was discussed. The pathophysiology of the colon was discussed. Natural history risks without surgery was discussed. I feel the risks of no intervention will lead to serious problems that outweigh the operative risks; therefore, I recommended a partial colectomy to remove the pathology. Minimally invasive (Robotic/Laparoscopic) & open techniques were discussed.  Risks such as bleeding, infection, abscess, leak, reoperation, possible ostomy, hernia, heart attack, death, and other risks were discussed. I noted a good likelihood this will help address the problem. Goals of post-operative recovery were discussed as well. Need for adequate nutrition, daily bowel regimen and healthy physical activity, to optimize recovery was  noted as well. We will work to minimize complications. Educational materials were available as well. Questions were answered. The patient expresses understanding & wishes to proceed with surgery.  Pt Education - [facility] Colectomy post-op instructions: discussed with patient and provided information.  SAdin Hector M.D., F.A.C.S. [facility] [address] [place] [phone] / Paging

## 2017-09-20 ENCOUNTER — Encounter: Payer: Self-pay | Admitting: Radiation Oncology

## 2017-09-20 ENCOUNTER — Other Ambulatory Visit: Payer: Self-pay

## 2017-09-20 ENCOUNTER — Ambulatory Visit
Admission: RE | Admit: 2017-09-20 | Discharge: 2017-09-20 | Disposition: A | Discharge status: Home / Self Care | Payer: BLUE CROSS/BLUE SHIELD | Source: Ambulatory Visit | Attending: Radiation Oncology | Admitting: Radiation Oncology

## 2017-09-20 VITALS — BP 135/92 | HR 71 | Temp 98.0°F | Resp 20 | Ht 73.0 in | Wt 195.2 lb

## 2017-09-20 DIAGNOSIS — Z510 Encounter for antineoplastic radiation therapy: Secondary | ICD-10-CM | POA: Diagnosis not present

## 2017-09-20 DIAGNOSIS — C20 Malignant neoplasm of rectum: Secondary | ICD-10-CM

## 2017-09-20 NOTE — Addendum Note (Signed)
Encounter addended by: Malena Edman, RN on: 09/20/2017 9:44 AM  Actions taken: Charge Capture section accepted

## 2017-09-20 NOTE — Progress Notes (Signed)
Radiation Oncology         [phone] ________________________________  Name: Paul [name] MRN: [id number]  Date of Service: 09/20/2017 DOB: 1966/01/26  Post Treatment Note  CC: Eulas Post, MD  Michael Boston, MD  Diagnosis:  Stage IIIB cT3, cN1, cM0 adenocarcinoma of the rectum  Interval Since Last Radiation: 4 weeks   9/26-11/2/18:  45 Gy in 25 fractions to rectum  5.4 Gy boost in 3 fractions   Narrative:  The patient returns today for routine follow-up. In summary this is a pleasant 51 y.o. male who has a history of Stage IIIB adenocarcinoma of the rectum. He completed radiotherapy on 08/24/17.    On review of systems, the patient states he continues to have intermittent rectal bleeding associated with hard stools.  He has been taking stool softeners, and denies any bleeding outside of having bowel movements.  He denies any bleeding outside of these episodes, and states that he does not have this every time he goes to the bathroom.  He has had a little more difficulty with erectile function since the last time I saw him.  He reports that he is able to continue with sexual activity once he maintains an erection.  He denies any chest pain or shortness of breath, he is excited about his upcoming surgery and hoping to have a good outcome.  He has follow-up with Dr. Renaee Munda in January as well.  No other complaints are verbalized  ALLERGIES:  is allergic to chloroquine.  Meds: Current Outpatient Medications  Medication Sig Dispense Refill  . amLODipine (NORVASC) 5 MG tablet TAKE 1 TABLET DAILY 90 tablet 1  . calcium carbonate (OS-CAL - DOSED IN MG OF ELEMENTAL CALCIUM) 1250 (500 Ca) MG tablet Take 1 tablet by mouth daily as needed (for bones).    . capecitabine (XELODA) 500 MG tablet Take 4 tabs (2000mg ) in AM & 3 tabs (1500mg ) in PM, within 18min of meals, days of radiation only, M-F (Patient not taking: Reported on 09/03/2017) 56 tablet 0  . cetirizine (ZYRTEC) 10 MG tablet  Take 10 mg by mouth daily as needed for allergies.    . Garlic 2 MG CAPS Take 2 mg by mouth daily.    Marland Kitchen HYDROcodone-acetaminophen (NORCO) 5-325 MG tablet Take 1-2 tablets by mouth every 6 (six) hours as needed for moderate pain. 60 tablet 0  . hydrocortisone (ANUSOL-HC) 25 MG suppository Place 25 mg rectally 2 (two) times daily. Take 1 suppository 2x day for 1 week 1 box (24 in a box)called to [facility] on [address]    . Multiple Vitamin (MULTIVITAMIN WITH MINERALS) TABS tablet Take 1 tablet by mouth daily.    Marland Kitchen omeprazole (PRILOSEC) 40 MG capsule Take 40 mg by mouth daily as needed (heart burn).     . ondansetron (ZOFRAN) 8 MG tablet Take 1 tablet (8 mg total) by mouth every 8 (eight) hours as needed for nausea or vomiting. 30 tablet 3  . oxyCODONE (OXY IR/ROXICODONE) 5 MG immediate release tablet Take 1-2 tablets (5-10 mg total) every 4 (four) hours as needed by mouth for severe pain. 60 tablet 0  . prochlorperazine (COMPAZINE) 10 MG tablet Take 1 tablet (10 mg total) by mouth every 6 (six) hours as needed for nausea or vomiting. (Patient not taking: Reported on 08/13/2017) 30 tablet 2  . psyllium (METAMUCIL) 58.6 % powder Take 1 packet by mouth 3 (three) times daily as needed.    . traMADol (ULTRAM) 50 MG tablet Take 50 mg every  6 (six) hours as needed by mouth.  1   No current facility-administered medications for this encounter.     Physical Findings: Wt Readings from Last 3 Encounters:  09/20/17 195 lb 3.2 oz (88.5 kg)  09/03/17 192 lb 1.6 oz (87.1 kg)  08/20/17 191 lb 14.4 oz (87 kg)   Temp Readings from Last 3 Encounters:  09/20/17 98 F (36.7 C) (Oral)  09/03/17 98.4 F (36.9 C) (Oral)  08/20/17 98.4 F (36.9 C) (Oral)   BP Readings from Last 3 Encounters:  09/20/17 (!) 135/92  09/03/17 133/89  08/20/17 (!) 144/89   Pulse Readings from Last 3 Encounters:  09/20/17 71  09/03/17 68  08/20/17 74   In general this is a well appearing African male in no acute distress. He's  alert and oriented x4 and appropriate throughout the examination. Cardiopulmonary assessment is negative for acute distress and he exhibits normal effort.   Lab Findings: Lab Results  Component Value Date   WBC 2.3 (L) 09/03/2017   HGB 11.4 (L) 09/03/2017   HCT 34.4 (L) 09/03/2017   MCV 88.9 09/03/2017   PLT 187 09/03/2017     Radiographic Findings: No results found.  Impression/Plan: 1. Stage IIIB cT3, cN1, cM0 adenocarcinoma of the rectum.  The patient's pain has improved significantly.  He is no longer taking.  We discussed the use of Anusol suppositories as needed, and reviewed the rationale for adding back MiraLAX to avoid bleeding due to constipation.  He will proceed surgically with Dr. Johney Maine in about a week and a half.  We will be happy to see him back as needed moving forward.  He states agreement and understanding.    Carola Rhine, PAC

## 2017-10-04 NOTE — Progress Notes (Signed)
09-17-17 Epic : CBCDIFF, CMP   CT CHEST 07-18-17 Epic

## 2017-10-04 NOTE — Patient Instructions (Addendum)
Paul [name]  10/04/2017   Your procedure is scheduled on: 10-10-17  Report to [facility] Main  Entrance Take [place]  elevators to 3rd floor to  [facility] [facility] [place].    Call this number if you have problems the morning of surgery [phone]    Remember: ONLY 1 PERSON MAY GO WITH YOU TO SHORT STAY TO GET  READY MORNING OF YOUR SURGERY.  Do not eat food After Midnight ON Monday 10-08-17. DRINK PLENTY OF CLEAR LIQUIDS ALL DAY Tuesday 10-09-17 AND FOLLOW ALL BOWEL PREP INSTRUCTIONS PROVIDED BY YOUR SURGEON. NOTHING BY MOUTH AFTER MIDNIGHT UNLESS SPECIFIED BY YOUR SURGEON!!     Take these medicines the morning of surgery with A SIP OF WATER: AMLODIPINE, OMEPRAZOLE, CETRIZINE IF NEEDED, HYDROCODONE IF NEEDED, OXYCODONE IF NEEDED                                You may not have any metal on your body including hair pins and              piercings  Do not wear jewelry, make-up, lotions, powders or perfumes, deodorant               Men may shave face and neck.   Do not bring valuables to the hospital. [place].  Contacts, dentures or bridgework may not be worn into surgery.  Leave suitcase in the car. After surgery it may be brought to your room.                 Please read over the following fact sheets you were given: _____________________________________________________________________    COLON BOWEL PREP Please follow the instructions carefully. It is important to clean out your bowels & take the prescribed antibiotic pills to lower your chances of a wound infection or abscess.   FIVE DAYS PRIOR TO YOUR SURGERY Stop eating any nuts, popcorn, or fruit with seeds. Stop all fiber supplements such as Metamucil, Citrucel, etc.   Hold taking any blood thinning anticoagulation medication (ex: aspirin, warfarin/Coumadin, Plavix, Xarelto, Eliquis, Pradaxa, etc) as recommended by your medical/cardiology  doctor  Obtain what you need at a pharmacy of your choice: -Filled out prescriptions for your oral antibiotics (Neomycin & Metronidazole)  -A bottle of MiraLax / Glycolax (288g) - no prescription required  -A large bottle of Gatorade / Powerade (64oz)  -Dulcolax tablets (4 tabs) - no prescription required   DAY PRIOR TO SURGERY   7:00am Swallow 4 Dulcolax tablets with some water Drink plenty of clear liquids all day to avoid getting dehydrated (Water, juice, soda, coffee, tea, bouillon, jello, etc.)  10:00am Mix the bottle of MiraLax with the 64-oz bottle of Gatorade.  Drink the Gatorade mixture gradually over the next few hours (8oz glass every 15-30 minutes) until gone. You should finish by 2pm.  2:00pm Take 2 Neomycin 500mg  tablets & 2 Metronidazole 500mg  tablets  3:00pm Take 2 Neomycin 500mg  tablets & 2 Metronidazole 500mg  tablets  Drink plenty of clear liquids all evening to avoid getting dehydrated  10:00pm Take 2 Neomycin 500mg  tablets & 2 Metronidazole 500mg  tablets  Do not eat or drink anything after bedtime (midnight) the night before your surgery.   MORNING OF SURGERY Remember  to not to drink or eat anything that morning  Hold or take medications as recommended by the hospital staff at your Preoperative visit  If you have questions or concerns, please call [facility] [phone] during business hours to speak to the clinical staff for advice.     CLEAR LIQUID DIET   Foods Allowed                                                                     Foods Excluded  Coffee and tea, regular and decaf                             liquids that you cannot  Plain Jell-O in any flavor                                             see through such as: Fruit ices (not with fruit pulp)                                     milk, soups, orange juice  Iced Popsicles                                    All solid food Carbonated beverages, regular and diet                                     Cranberry, grape and apple juices Sports drinks like Gatorade Lightly seasoned clear broth or consume(fat free) Sugar, honey syrup  Sample Menu Breakfast                                Lunch                                     Supper Cranberry juice                    Beef broth                            Chicken broth Jell-O                                     Grape juice                           Apple juice Coffee or tea                        Jell-O  Popsicle                                                Coffee or tea                        Coffee or tea  _____________________________________________________________________  [facility] - Preparing for Surgery Before surgery, you can play an important role.  Because skin is not sterile, your skin needs to be as free of germs as possible.  You can reduce the number of germs on your skin by washing with CHG (chlorahexidine gluconate) soap before surgery.  CHG is an antiseptic cleaner which kills germs and bonds with the skin to continue killing germs even after washing. Please DO NOT use if you have an allergy to CHG or antibacterial soaps.  If your skin becomes reddened/irritated stop using the CHG and inform your nurse when you arrive at Short Stay. Do not shave (including legs and underarms) for at least 48 hours prior to the first CHG shower.  You may shave your face/neck. Please follow these instructions carefully:  1.  Shower with CHG Soap the night before surgery and the  morning of Surgery.  2.  If you choose to wash your hair, wash your hair first as usual with your  normal  shampoo.  3.  After you shampoo, rinse your hair and body thoroughly to remove the  shampoo.                           4.  Use CHG as you would any other liquid soap.  You can apply chg directly  to the skin and wash                       Gently with a scrungie or clean washcloth.  5.  Apply the CHG  Soap to your body ONLY FROM THE NECK DOWN.   Do not use on face/ open                           Wound or open sores. Avoid contact with eyes, ears mouth and genitals (private parts).                       Wash face,  Genitals (private parts) with your normal soap.             6.  Wash thoroughly, paying special attention to the area where your surgery  will be performed.  7.  Thoroughly rinse your body with warm water from the neck down.  8.  DO NOT shower/wash with your normal soap after using and rinsing off  the CHG Soap.                9.  Pat yourself dry with a clean towel.            10.  Wear clean pajamas.            11.  Place clean sheets on your bed the night of your first shower and do not  sleep with pets. Day of Surgery : Do not apply any lotions/deodorants the morning of surgery.  Please wear clean clothes to the hospital/surgery  center.  FAILURE TO FOLLOW THESE INSTRUCTIONS MAY RESULT IN THE CANCELLATION OF YOUR SURGERY PATIENT SIGNATURE_________________________________  NURSE SIGNATURE__________________________________  ________________________________________________________________________  WHAT IS A BLOOD TRANSFUSION? Blood Transfusion Information  A transfusion is the replacement of blood or some of its parts. Blood is made up of multiple cells which provide different functions.  Red blood cells carry oxygen and are used for blood loss replacement.  White blood cells fight against infection.  Platelets control bleeding.  Plasma helps clot blood.  Other blood products are available for specialized needs, such as hemophilia or other clotting disorders. BEFORE THE TRANSFUSION  Who gives blood for transfusions?   Healthy volunteers who are fully evaluated to make sure their blood is safe. This is blood bank blood. Transfusion therapy is the safest it has ever been in the practice of medicine. Before blood is taken from a donor, a complete history is taken to make  sure that person has no history of diseases nor engages in risky social behavior (examples are intravenous drug use or sexual activity with multiple partners). The donor's travel history is screened to minimize risk of transmitting infections, such as malaria. The donated blood is tested for signs of infectious diseases, such as HIV and hepatitis. The blood is then tested to be sure it is compatible with you in order to minimize the chance of a transfusion reaction. If you or a relative donates blood, this is often done in anticipation of surgery and is not appropriate for emergency situations. It takes many days to process the donated blood. RISKS AND COMPLICATIONS Although transfusion therapy is very safe and saves many lives, the main dangers of transfusion include:   Getting an infectious disease.  Developing a transfusion reaction. This is an allergic reaction to something in the blood you were given. Every precaution is taken to prevent this. The decision to have a blood transfusion has been considered carefully by your caregiver before blood is given. Blood is not given unless the benefits outweigh the risks. AFTER THE TRANSFUSION  Right after receiving a blood transfusion, you will usually feel much better and more energetic. This is especially true if your red blood cells have gotten low (anemic). The transfusion raises the level of the red blood cells which carry oxygen, and this usually causes an energy increase.  The nurse administering the transfusion will monitor you carefully for complications. HOME CARE INSTRUCTIONS  No special instructions are needed after a transfusion. You may find your energy is better. Speak with your caregiver about any limitations on activity for underlying diseases you may have. SEEK MEDICAL CARE IF:   Your condition is not improving after your transfusion.  You develop redness or irritation at the intravenous (IV) site. SEEK IMMEDIATE MEDICAL CARE IF:   Any of the following symptoms occur over the next 12 hours:  Shaking chills.  You have a temperature by mouth above 102 F (38.9 C), not controlled by medicine.  Chest, back, or muscle pain.  People around you feel you are not acting correctly or are confused.  Shortness of breath or difficulty breathing.  Dizziness and fainting.  You get a rash or develop hives.  You have a decrease in urine output.  Your urine turns a dark color or changes to pink, red, or brown. Any of the following symptoms occur over the next 10 days:  You have a temperature by mouth above 102 F (38.9 C), not controlled by medicine.  Shortness of breath.  Weakness after normal  activity.  The white part of the eye turns yellow (jaundice).  You have a decrease in the amount of urine or are urinating less often.  Your urine turns a dark color or changes to pink, red, or brown. Document Released: 10/06/2000 Document Revised: 01/01/2012 Document Reviewed: 05/25/2008 [facility] Patient Information 2014 [place], [place].  _______________________________________________________________________

## 2017-10-08 ENCOUNTER — Other Ambulatory Visit: Payer: Self-pay

## 2017-10-08 ENCOUNTER — Encounter (HOSPITAL_COMMUNITY)
Admission: RE | Admit: 2017-10-08 | Discharge: 2017-10-08 | Disposition: A | Discharge status: Home / Self Care | Payer: BLUE CROSS/BLUE SHIELD | Source: Ambulatory Visit | Attending: Surgery | Admitting: Surgery

## 2017-10-08 ENCOUNTER — Encounter (HOSPITAL_COMMUNITY): Payer: Self-pay

## 2017-10-08 DIAGNOSIS — Z0183 Encounter for blood typing: Secondary | ICD-10-CM | POA: Insufficient documentation

## 2017-10-08 DIAGNOSIS — Z01812 Encounter for preprocedural laboratory examination: Secondary | ICD-10-CM | POA: Insufficient documentation

## 2017-10-08 DIAGNOSIS — Z01818 Encounter for other preprocedural examination: Secondary | ICD-10-CM | POA: Insufficient documentation

## 2017-10-08 DIAGNOSIS — C20 Malignant neoplasm of rectum: Secondary | ICD-10-CM

## 2017-10-08 DIAGNOSIS — I10 Essential (primary) hypertension: Secondary | ICD-10-CM | POA: Insufficient documentation

## 2017-10-08 LAB — CBC
HCT: 38.6 % — ABNORMAL LOW (ref 39.0–52.0)
HEMOGLOBIN: 12.8 g/dL — AB (ref 13.0–17.0)
MCH: 28.7 pg (ref 26.0–34.0)
MCHC: 33.2 g/dL (ref 30.0–36.0)
MCV: 86.5 fL (ref 78.0–100.0)
PLATELETS: 202 10*3/uL (ref 150–400)
RBC: 4.46 MIL/uL (ref 4.22–5.81)
RDW: 15 % (ref 11.5–15.5)
WBC: 3.3 10*3/uL — AB (ref 4.0–10.5)

## 2017-10-08 LAB — ABO/RH: ABO/RH(D): AB POS

## 2017-10-08 LAB — BASIC METABOLIC PANEL
ANION GAP: 4 — AB (ref 5–15)
BUN: 13 mg/dL (ref 6–20)
CHLORIDE: 105 mmol/L (ref 101–111)
CO2: 30 mmol/L (ref 22–32)
Calcium: 9.9 mg/dL (ref 8.9–10.3)
Creatinine, Ser: 0.73 mg/dL (ref 0.61–1.24)
GFR calc Af Amer: >60 mL/min (ref >60)
GLUCOSE: 106 mg/dL — AB (ref 65–99)
POTASSIUM: 4.2 mmol/L (ref 3.5–5.1)
SODIUM: 139 mmol/L (ref 135–145)

## 2017-10-08 NOTE — Consult Note (Signed)
[place] Nurse ostomy consult note  [place] Nurse requested for preoperative stoma site marking by Dr. Johney [place].  Discussed surgical procedure and stoma creation with patient.  Explained role of the [place] nurse team. Answered patient questions.   Examined patient sitting and standing in order to place the marking in the patient's visual field, away from any creases or abdominal contour issues and within the rectus muscle.   Marked for colostomy in the LLQ  8cm to the left of the umbilicus and 7.8IO above the umbilicus.  Marked for ileostomy in the RLQ  6cm to the right of the umbilicus and   3.5 cm above the umbilicus.  Patient's abdomen cleansed with CHG wipes at site markings x2, allowed to air dry prior to marking.Covered marks with thin film transparent dressings to preserve mark until date of surgery (10/10/17).  Patient is able to visualize markings.  [place] nursing team will follow, and will remain available to this patient, the nursing, surgical, and medical teams should a stoma be created intraoperatively.  Thanks, Maudie Flakes, MSN, RN, [place], Arther Abbott  Pager# [phone]

## 2017-10-09 LAB — HEMOGLOBIN A1C
HEMOGLOBIN A1C: 5.4 % (ref 4.8–5.6)
MEAN PLASMA GLUCOSE: 108 mg/dL

## 2017-10-09 MED ORDER — SODIUM CHLORIDE 0.9 % IV SOLN
INTRAVENOUS | Status: DC
  Filled 2017-10-09: qty 6

## 2017-10-09 NOTE — Anesthesia Preprocedure Evaluation (Signed)
Anesthesia Evaluation  Patient identified by MRN, date of birth, ID band Patient awake    Reviewed: Allergy & Precautions, NPO status , Patient's Chart, lab work & pertinent test results  Airway Mallampati: II  TM Distance: >3 FB Neck ROM: Full    Dental no notable dental hx.    Pulmonary neg pulmonary ROS,    Pulmonary exam normal breath sounds clear to auscultation       Cardiovascular hypertension, Pt. on medications negative cardio ROS Normal cardiovascular exam Rhythm:Regular Rate:Normal     Neuro/Psych negative neurological ROS  negative psych ROS   GI/Hepatic negative GI ROS, Neg liver ROS, GERD  ,  Endo/Other  negative endocrine ROS  Renal/GU negative Renal ROS  negative genitourinary   Musculoskeletal negative musculoskeletal ROS (+)   Abdominal   Peds negative pediatric ROS (+)  Hematology negative hematology ROS (+)   Anesthesia Other Findings   Reproductive/Obstetrics negative OB ROS                             Anesthesia Physical  Anesthesia Plan  ASA: II  Anesthesia Plan: General   Post-op Pain Management:    Induction: Intravenous  PONV Risk Score and Plan: 2 and Ondansetron, Dexamethasone and Treatment may vary due to age or medical condition  Airway Management Planned: Oral ETT  Additional Equipment:   Intra-op Plan:   Post-operative Plan: Extubation in OR  Informed Consent: I have reviewed the patients History and Physical, chart, labs and discussed the procedure including the risks, benefits and alternatives for the proposed anesthesia with the patient or authorized representative who has indicated his/her understanding and acceptance.     Plan Discussed with: CRNA and Anesthesiologist  Anesthesia Plan Comments: (  )        Anesthesia Quick Evaluation

## 2017-10-10 ENCOUNTER — Encounter (HOSPITAL_COMMUNITY): Payer: Self-pay

## 2017-10-10 ENCOUNTER — Inpatient Hospital Stay (HOSPITAL_COMMUNITY)
Admission: RE | Admit: 2017-10-10 | Discharge: 2017-10-12 | DRG: 334 | Disposition: A | Discharge status: Home / Self Care | Payer: BLUE CROSS/BLUE SHIELD | Source: Ambulatory Visit | Attending: Surgery | Admitting: Surgery

## 2017-10-10 ENCOUNTER — Other Ambulatory Visit: Payer: Self-pay

## 2017-10-10 ENCOUNTER — Inpatient Hospital Stay (HOSPITAL_COMMUNITY): Payer: BLUE CROSS/BLUE SHIELD | Admitting: Anesthesiology

## 2017-10-10 ENCOUNTER — Encounter (HOSPITAL_COMMUNITY)
Admission: RE | Disposition: A | Discharge status: Home / Self Care | Payer: Self-pay | Source: Ambulatory Visit | Attending: Surgery

## 2017-10-10 DIAGNOSIS — Z9221 Personal history of antineoplastic chemotherapy: Secondary | ICD-10-CM

## 2017-10-10 DIAGNOSIS — D500 Iron deficiency anemia secondary to blood loss (chronic): Secondary | ICD-10-CM | POA: Diagnosis present

## 2017-10-10 DIAGNOSIS — Z79899 Other long term (current) drug therapy: Secondary | ICD-10-CM | POA: Diagnosis not present

## 2017-10-10 DIAGNOSIS — C19 Malignant neoplasm of rectosigmoid junction: Secondary | ICD-10-CM | POA: Diagnosis present

## 2017-10-10 DIAGNOSIS — Z923 Personal history of irradiation: Secondary | ICD-10-CM

## 2017-10-10 DIAGNOSIS — I10 Essential (primary) hypertension: Secondary | ICD-10-CM | POA: Diagnosis present

## 2017-10-10 DIAGNOSIS — K219 Gastro-esophageal reflux disease without esophagitis: Secondary | ICD-10-CM | POA: Diagnosis present

## 2017-10-10 DIAGNOSIS — C20 Malignant neoplasm of rectum: Secondary | ICD-10-CM | POA: Diagnosis present

## 2017-10-10 HISTORY — DX: Other bacterial infections of unspecified site: A49.8

## 2017-10-10 HISTORY — PX: XI ROBOTIC ASSISTED LOWER ANTERIOR RESECTION: SHX6558

## 2017-10-10 HISTORY — DX: Essential (primary) hypertension: I10

## 2017-10-10 HISTORY — DX: Malignant neoplasm of rectum: C20

## 2017-10-10 HISTORY — PX: PROCTOSCOPY: SHX2266

## 2017-10-10 LAB — TYPE AND SCREEN
ABO/RH(D): AB POS
Antibody Screen: NEGATIVE

## 2017-10-10 SURGERY — RESECTION, RECTUM, LOW ANTERIOR, ROBOT-ASSISTED
Anesthesia: General | Site: Rectum

## 2017-10-10 MED ORDER — PROPOFOL 10 MG/ML IV BOLUS
INTRAVENOUS | Status: DC | PRN
  Administered 2017-10-10: 170 mg via INTRAVENOUS

## 2017-10-10 MED ORDER — BUPIVACAINE LIPOSOME 1.3 % IJ SUSP
20.0000 mL | INTRAMUSCULAR | Status: DC
  Filled 2017-10-10: qty 20

## 2017-10-10 MED ORDER — SODIUM CHLORIDE 0.9 % IV SOLN
INTRAVENOUS | Status: DC
  Administered 2017-10-10: 19:00:00 via INTRAVENOUS

## 2017-10-10 MED ORDER — KETAMINE HCL 10 MG/ML IJ SOLN
INTRAMUSCULAR | Status: DC | PRN
  Administered 2017-10-10: 40 mg via INTRAVENOUS

## 2017-10-10 MED ORDER — LIDOCAINE 2% (20 MG/ML) 5 ML SYRINGE
INTRAMUSCULAR | Status: AC
  Filled 2017-10-10: qty 5

## 2017-10-10 MED ORDER — BISACODYL 5 MG PO TBEC: 20.0000 mg | DELAYED_RELEASE_TABLET | Freq: Once | ORAL | Status: DC

## 2017-10-10 MED ORDER — ROCURONIUM BROMIDE 10 MG/ML (PF) SYRINGE
PREFILLED_SYRINGE | INTRAVENOUS | Status: DC | PRN
  Administered 2017-10-10 (×2): 20 mg via INTRAVENOUS
  Administered 2017-10-10: 50 mg via INTRAVENOUS

## 2017-10-10 MED ORDER — ACETAMINOPHEN 325 MG PO TABS: 325.0000 mg | ORAL_TABLET | ORAL | Status: DC | PRN

## 2017-10-10 MED ORDER — ACETAMINOPHEN 500 MG PO TABS
1000.0000 mg | ORAL_TABLET | ORAL | Status: AC
  Administered 2017-10-10: 1000 mg via ORAL
  Filled 2017-10-10: qty 2

## 2017-10-10 MED ORDER — ALVIMOPAN 12 MG PO CAPS: 12.0000 mg | ORAL_CAPSULE | Freq: Two times a day (BID) | ORAL | Status: DC

## 2017-10-10 MED ORDER — MAGIC MOUTHWASH
15.0000 mL | Freq: Four times a day (QID) | ORAL | Status: DC | PRN
  Filled 2017-10-10: qty 15

## 2017-10-10 MED ORDER — PHENOL 1.4 % MT LIQD: 1.0000 | OROMUCOSAL | Status: DC | PRN

## 2017-10-10 MED ORDER — CELECOXIB 200 MG PO CAPS
200.0000 mg | ORAL_CAPSULE | ORAL | Status: AC
  Administered 2017-10-10: 200 mg via ORAL
  Filled 2017-10-10: qty 1

## 2017-10-10 MED ORDER — LIP MEDEX EX OINT
1.0000 "application " | TOPICAL_OINTMENT | Freq: Two times a day (BID) | CUTANEOUS | Status: DC
  Administered 2017-10-10 – 2017-10-12 (×4): 1 via TOPICAL
  Filled 2017-10-10: qty 7

## 2017-10-10 MED ORDER — FENTANYL CITRATE (PF) 100 MCG/2ML IJ SOLN
INTRAMUSCULAR | Status: AC
  Administered 2017-10-10: 50 ug via INTRAVENOUS
  Filled 2017-10-10: qty 2

## 2017-10-10 MED ORDER — SCOPOLAMINE 1 MG/3DAYS TD PT72
MEDICATED_PATCH | TRANSDERMAL | Status: DC | PRN
  Administered 2017-10-10: 1 via TRANSDERMAL

## 2017-10-10 MED ORDER — SUGAMMADEX SODIUM 200 MG/2ML IV SOLN
INTRAVENOUS | Status: AC
  Filled 2017-10-10: qty 2

## 2017-10-10 MED ORDER — ACETAMINOPHEN 160 MG/5ML PO SOLN: 325.0000 mg | ORAL | Status: DC | PRN

## 2017-10-10 MED ORDER — OXYCODONE HCL 5 MG PO TABS: 5.0000 mg | ORAL_TABLET | Freq: Once | ORAL | Status: DC | PRN

## 2017-10-10 MED ORDER — METRONIDAZOLE 500 MG PO TABS: 1000.0000 mg | ORAL_TABLET | ORAL | Status: DC

## 2017-10-10 MED ORDER — HYDROMORPHONE HCL 1 MG/ML IJ SOLN: 0.5000 mg | INTRAMUSCULAR | Status: DC | PRN

## 2017-10-10 MED ORDER — MENTHOL 3 MG MT LOZG
1.0000 | LOZENGE | OROMUCOSAL | Status: DC | PRN
Start: 2017-10-10 — End: 2017-10-12

## 2017-10-10 MED ORDER — LACTATED RINGERS IV BOLUS (SEPSIS): 1000.0000 mL | Freq: Three times a day (TID) | INTRAVENOUS | Status: DC | PRN

## 2017-10-10 MED ORDER — DIPHENHYDRAMINE HCL 50 MG/ML IJ SOLN: 12.5000 mg | Freq: Four times a day (QID) | INTRAMUSCULAR | Status: DC | PRN

## 2017-10-10 MED ORDER — BUPIVACAINE LIPOSOME 1.3 % IJ SUSP
INTRAMUSCULAR | Status: DC | PRN
  Administered 2017-10-10: 20 mL

## 2017-10-10 MED ORDER — DIPHENHYDRAMINE HCL 12.5 MG/5ML PO ELIX: 12.5000 mg | ORAL_SOLUTION | Freq: Four times a day (QID) | ORAL | Status: DC | PRN

## 2017-10-10 MED ORDER — CHLORHEXIDINE GLUCONATE 4 % EX LIQD: 60.0000 mL | Freq: Once | CUTANEOUS | Status: DC

## 2017-10-10 MED ORDER — ENSURE PRE-SURGERY PO LIQD
296.0000 mL | Freq: Once | ORAL | Status: DC
  Filled 2017-10-10: qty 296

## 2017-10-10 MED ORDER — OXYCODONE HCL 5 MG/5ML PO SOLN
5.0000 mg | Freq: Once | ORAL | Status: DC | PRN
  Filled 2017-10-10: qty 5

## 2017-10-10 MED ORDER — LIDOCAINE HCL 2 % IJ SOLN
INTRAMUSCULAR | Status: AC
  Filled 2017-10-10: qty 20

## 2017-10-10 MED ORDER — PROMETHAZINE HCL 25 MG/ML IJ SOLN
6.2500 mg | INTRAMUSCULAR | Status: DC | PRN
  Administered 2017-10-10: 6.25 mg via INTRAVENOUS

## 2017-10-10 MED ORDER — CEFOTETAN DISODIUM-DEXTROSE 2-2.08 GM-%(50ML) IV SOLR
2.0000 g | INTRAVENOUS | Status: AC
  Administered 2017-10-10: 2 g via INTRAVENOUS
  Filled 2017-10-10: qty 50

## 2017-10-10 MED ORDER — PANTOPRAZOLE SODIUM 40 MG PO TBEC
40.0000 mg | DELAYED_RELEASE_TABLET | Freq: Every day | ORAL | Status: DC
  Administered 2017-10-10 – 2017-10-11 (×2): 40 mg via ORAL
  Filled 2017-10-10 (×2): qty 1

## 2017-10-10 MED ORDER — ENOXAPARIN SODIUM 40 MG/0.4ML ~~LOC~~ SOLN
40.0000 mg | Freq: Once | SUBCUTANEOUS | Status: AC
  Administered 2017-10-10: 40 mg via SUBCUTANEOUS
  Filled 2017-10-10: qty 0.4

## 2017-10-10 MED ORDER — ONDANSETRON HCL 4 MG/2ML IJ SOLN: 4.0000 mg | Freq: Four times a day (QID) | INTRAMUSCULAR | Status: DC | PRN

## 2017-10-10 MED ORDER — ROCURONIUM BROMIDE 50 MG/5ML IV SOSY
PREFILLED_SYRINGE | INTRAVENOUS | Status: AC
  Filled 2017-10-10: qty 10

## 2017-10-10 MED ORDER — GABAPENTIN 300 MG PO CAPS
300.0000 mg | ORAL_CAPSULE | Freq: Two times a day (BID) | ORAL | Status: DC
  Administered 2017-10-10 – 2017-10-12 (×4): 300 mg via ORAL
  Filled 2017-10-10 (×2): qty 1
  Filled 2017-10-10: qty 3
  Filled 2017-10-10 (×2): qty 1

## 2017-10-10 MED ORDER — FENTANYL CITRATE (PF) 100 MCG/2ML IJ SOLN
25.0000 ug | INTRAMUSCULAR | Status: AC | PRN
  Administered 2017-10-10 (×5): 50 ug via INTRAVENOUS
  Administered 2017-10-10: 25 ug via INTRAVENOUS

## 2017-10-10 MED ORDER — LACTATED RINGERS IV SOLN
INTRAVENOUS | Status: DC
  Administered 2017-10-10 (×3): via INTRAVENOUS

## 2017-10-10 MED ORDER — 0.9 % SODIUM CHLORIDE (POUR BTL) OPTIME
TOPICAL | Status: DC | PRN
  Administered 2017-10-10: 2000 mL

## 2017-10-10 MED ORDER — DEXAMETHASONE SODIUM PHOSPHATE 10 MG/ML IJ SOLN
INTRAMUSCULAR | Status: DC | PRN
Start: 2017-10-10 — End: 2017-10-10
  Administered 2017-10-10: 10 mg via INTRAVENOUS

## 2017-10-10 MED ORDER — DEXTROSE 5 % IV SOLN
1000.0000 mg | Freq: Four times a day (QID) | INTRAVENOUS | Status: DC | PRN
  Filled 2017-10-10: qty 10

## 2017-10-10 MED ORDER — MIDAZOLAM HCL 2 MG/2ML IJ SOLN
INTRAMUSCULAR | Status: AC
  Filled 2017-10-10: qty 2

## 2017-10-10 MED ORDER — ALVIMOPAN 12 MG PO CAPS
12.0000 mg | ORAL_CAPSULE | Freq: Once | ORAL | Status: AC
  Administered 2017-10-10: 12 mg via ORAL
  Filled 2017-10-10: qty 1

## 2017-10-10 MED ORDER — ENSURE SURGERY PO LIQD
237.0000 mL | Freq: Two times a day (BID) | ORAL | Status: DC
  Filled 2017-10-10: qty 237

## 2017-10-10 MED ORDER — MEPERIDINE HCL 50 MG/ML IJ SOLN: 6.2500 mg | INTRAMUSCULAR | Status: DC | PRN

## 2017-10-10 MED ORDER — BUPIVACAINE-EPINEPHRINE 0.25% -1:200000 IJ SOLN
INTRAMUSCULAR | Status: AC
  Filled 2017-10-10: qty 1

## 2017-10-10 MED ORDER — HYDROCORTISONE 2.5 % RE CREA: 1.0000 "application " | TOPICAL_CREAM | Freq: Four times a day (QID) | RECTAL | Status: DC | PRN

## 2017-10-10 MED ORDER — METHOCARBAMOL 500 MG PO TABS
1000.0000 mg | ORAL_TABLET | Freq: Four times a day (QID) | ORAL | Status: DC | PRN
  Administered 2017-10-10: 1000 mg via ORAL
  Filled 2017-10-10: qty 2

## 2017-10-10 MED ORDER — ENOXAPARIN SODIUM 40 MG/0.4ML ~~LOC~~ SOLN
40.0000 mg | SUBCUTANEOUS | Status: DC
  Administered 2017-10-11: 40 mg via SUBCUTANEOUS
  Filled 2017-10-10 (×2): qty 0.4

## 2017-10-10 MED ORDER — KETOROLAC TROMETHAMINE 30 MG/ML IJ SOLN: 30.0000 mg | Freq: Once | INTRAMUSCULAR | Status: DC | PRN

## 2017-10-10 MED ORDER — ONDANSETRON HCL 4 MG PO TABS: 4.0000 mg | ORAL_TABLET | Freq: Four times a day (QID) | ORAL | Status: DC | PRN

## 2017-10-10 MED ORDER — GENTAMICIN SULFATE 40 MG/ML IJ SOLN
INTRAMUSCULAR | Status: DC | PRN
  Administered 2017-10-10: 1000 mL

## 2017-10-10 MED ORDER — SUGAMMADEX SODIUM 200 MG/2ML IV SOLN
INTRAVENOUS | Status: DC | PRN
Start: 2017-10-10 — End: 2017-10-10
  Administered 2017-10-10: 200 mg via INTRAVENOUS

## 2017-10-10 MED ORDER — SACCHAROMYCES BOULARDII 250 MG PO CAPS
250.0000 mg | ORAL_CAPSULE | Freq: Two times a day (BID) | ORAL | Status: DC
  Administered 2017-10-10 – 2017-10-12 (×4): 250 mg via ORAL
  Filled 2017-10-10 (×5): qty 1

## 2017-10-10 MED ORDER — PROMETHAZINE HCL 25 MG/ML IJ SOLN
INTRAMUSCULAR | Status: AC
  Filled 2017-10-10: qty 1

## 2017-10-10 MED ORDER — ONDANSETRON HCL 4 MG/2ML IJ SOLN
INTRAMUSCULAR | Status: AC
  Filled 2017-10-10: qty 2

## 2017-10-10 MED ORDER — BUPIVACAINE-EPINEPHRINE 0.25% -1:200000 IJ SOLN
INTRAMUSCULAR | Status: DC | PRN
  Administered 2017-10-10: 50 mL

## 2017-10-10 MED ORDER — ALUM & MAG HYDROXIDE-SIMETH 200-200-20 MG/5ML PO SUSP: 30.0000 mL | Freq: Four times a day (QID) | ORAL | Status: DC | PRN

## 2017-10-10 MED ORDER — ENSURE PRE-SURGERY PO LIQD
592.0000 mL | Freq: Once | ORAL | Status: DC
  Filled 2017-10-10: qty 592

## 2017-10-10 MED ORDER — METOPROLOL TARTRATE 5 MG/5ML IV SOLN: 5.0000 mg | Freq: Four times a day (QID) | INTRAVENOUS | Status: DC | PRN

## 2017-10-10 MED ORDER — FENTANYL CITRATE (PF) 100 MCG/2ML IJ SOLN
25.0000 ug | INTRAMUSCULAR | Status: DC | PRN
  Administered 2017-10-10: 50 ug via INTRAVENOUS
  Administered 2017-10-10: 25 ug via INTRAVENOUS

## 2017-10-10 MED ORDER — LIDOCAINE 2% (20 MG/ML) 5 ML SYRINGE
INTRAMUSCULAR | Status: DC | PRN
Start: 2017-10-10 — End: 2017-10-10
  Administered 2017-10-10: 100 mg via INTRAVENOUS

## 2017-10-10 MED ORDER — LACTATED RINGERS IR SOLN
Status: DC | PRN
  Administered 2017-10-10: 1000 mL

## 2017-10-10 MED ORDER — POLYETHYLENE GLYCOL 3350 17 GM/SCOOP PO POWD: 1.0000 | Freq: Once | ORAL | Status: DC

## 2017-10-10 MED ORDER — PROPOFOL 10 MG/ML IV BOLUS
INTRAVENOUS | Status: AC
  Filled 2017-10-10: qty 20

## 2017-10-10 MED ORDER — ZOLPIDEM TARTRATE 5 MG PO TABS: 5.0000 mg | ORAL_TABLET | Freq: Every evening | ORAL | Status: DC | PRN

## 2017-10-10 MED ORDER — ONDANSETRON HCL 4 MG/2ML IJ SOLN
4.0000 mg | Freq: Once | INTRAMUSCULAR | Status: AC | PRN
  Administered 2017-10-10: 4 mg via INTRAVENOUS

## 2017-10-10 MED ORDER — NEOMYCIN SULFATE 500 MG PO TABS: 1000.0000 mg | ORAL_TABLET | ORAL | Status: DC

## 2017-10-10 MED ORDER — KETAMINE HCL 10 MG/ML IJ SOLN
INTRAMUSCULAR | Status: AC
  Filled 2017-10-10: qty 1

## 2017-10-10 MED ORDER — LORATADINE 10 MG PO TABS
10.0000 mg | ORAL_TABLET | Freq: Every day | ORAL | Status: DC
  Administered 2017-10-10 – 2017-10-12 (×3): 10 mg via ORAL
  Filled 2017-10-10 (×4): qty 1

## 2017-10-10 MED ORDER — GUAIFENESIN-DM 100-10 MG/5ML PO SYRP: 10.0000 mL | ORAL_SOLUTION | ORAL | Status: DC | PRN

## 2017-10-10 MED ORDER — MIDAZOLAM HCL 5 MG/5ML IJ SOLN
INTRAMUSCULAR | Status: DC | PRN
  Administered 2017-10-10: 2 mg via INTRAVENOUS

## 2017-10-10 MED ORDER — DEXTROSE 5 % IV SOLN
2.0000 g | Freq: Two times a day (BID) | INTRAVENOUS | Status: AC
  Administered 2017-10-10: 2 g via INTRAVENOUS
  Filled 2017-10-10: qty 2

## 2017-10-10 MED ORDER — LIDOCAINE 2% (20 MG/ML) 5 ML SYRINGE
INTRAMUSCULAR | Status: DC | PRN
  Administered 2017-10-10: 1.5 mg/kg/h via INTRAVENOUS

## 2017-10-10 MED ORDER — ACETAMINOPHEN 500 MG PO TABS
1000.0000 mg | ORAL_TABLET | Freq: Three times a day (TID) | ORAL | Status: DC
  Administered 2017-10-10 – 2017-10-12 (×6): 1000 mg via ORAL
  Filled 2017-10-10 (×7): qty 2

## 2017-10-10 MED ORDER — NAPROXEN 500 MG PO TABS: 500.0000 mg | ORAL_TABLET | Freq: Two times a day (BID) | ORAL | 1 refills | Status: AC | PRN

## 2017-10-10 MED ORDER — SCOPOLAMINE 1 MG/3DAYS TD PT72
MEDICATED_PATCH | TRANSDERMAL | Status: AC
  Filled 2017-10-10: qty 1

## 2017-10-10 MED ORDER — GABAPENTIN 300 MG PO CAPS
300.0000 mg | ORAL_CAPSULE | ORAL | Status: AC
  Administered 2017-10-10: 300 mg via ORAL
  Filled 2017-10-10: qty 1

## 2017-10-10 MED ORDER — DEXAMETHASONE SODIUM PHOSPHATE 10 MG/ML IJ SOLN
INTRAMUSCULAR | Status: AC
  Filled 2017-10-10: qty 1

## 2017-10-10 MED ORDER — LACTATED RINGERS IV SOLN: 1000.0000 mL | Freq: Three times a day (TID) | INTRAVENOUS | Status: DC | PRN

## 2017-10-10 MED ORDER — TRAMADOL HCL 50 MG PO TABS: 50.0000 mg | ORAL_TABLET | Freq: Four times a day (QID) | ORAL | 0 refills | Status: DC | PRN

## 2017-10-10 MED ORDER — HYDROCORTISONE 1 % EX CREA: 1.0000 "application " | TOPICAL_CREAM | Freq: Three times a day (TID) | CUTANEOUS | Status: DC | PRN

## 2017-10-10 MED ORDER — FENTANYL CITRATE (PF) 250 MCG/5ML IJ SOLN
INTRAMUSCULAR | Status: AC
  Filled 2017-10-10: qty 5

## 2017-10-10 SURGICAL SUPPLY — 107 items
APPLIER CLIP 5 13 M/L LIGAMAX5 (MISCELLANEOUS)
APPLIER CLIP ROT 10 11.4 M/L (STAPLE)
APR CLP MED LRG 11.4X10 (STAPLE)
APR CLP MED LRG 5 ANG JAW (MISCELLANEOUS)
BLADE EXTENDED COATED 6.5IN (ELECTRODE) IMPLANT
CANNULA REDUC XI 12-8 STAPL (CANNULA) ×1
CANNULA REDUC XI 12-8MM STAPL (CANNULA) ×1
CANNULA REDUCER 12-8 DVNC XI (CANNULA) ×2 IMPLANT
CELLS DAT CNTRL 66122 CELL SVR (MISCELLANEOUS) IMPLANT
CHLORAPREP W/TINT 26ML (MISCELLANEOUS) ×4 IMPLANT
CLIP APPLIE 5 13 M/L LIGAMAX5 (MISCELLANEOUS) IMPLANT
CLIP APPLIE ROT 10 11.4 M/L (STAPLE) IMPLANT
CLIP VESOLOCK LG 6/CT PURPLE (CLIP) IMPLANT
CLIP VESOLOCK MED LG 6/CT (CLIP) IMPLANT
COUNTER NEEDLE 20 DBL MAG RED (NEEDLE) ×4 IMPLANT
COVER MAYO STAND STRL (DRAPES) ×8 IMPLANT
COVER SURGICAL LIGHT HANDLE (MISCELLANEOUS) ×4 IMPLANT
COVER TIP SHEARS 8 DVNC (MISCELLANEOUS) ×2 IMPLANT
COVER TIP SHEARS 8MM DA VINCI (MISCELLANEOUS) ×2
DECANTER SPIKE VIAL GLASS SM (MISCELLANEOUS) ×4 IMPLANT
DEVICE TROCAR PUNCTURE CLOSURE (ENDOMECHANICALS) IMPLANT
DRAIN CHANNEL 19F RND (DRAIN) IMPLANT
DRAPE ARM DVNC X/XI (DISPOSABLE) ×8 IMPLANT
DRAPE COLUMN DVNC XI (DISPOSABLE) ×2 IMPLANT
DRAPE DA VINCI XI ARM (DISPOSABLE) ×8
DRAPE DA VINCI XI COLUMN (DISPOSABLE) ×2
DRAPE SURG IRRIG POUCH 19X23 (DRAPES) ×4 IMPLANT
DRSG OPSITE POSTOP 4X10 (GAUZE/BANDAGES/DRESSINGS) IMPLANT
DRSG OPSITE POSTOP 4X6 (GAUZE/BANDAGES/DRESSINGS) ×2 IMPLANT
DRSG OPSITE POSTOP 4X8 (GAUZE/BANDAGES/DRESSINGS) IMPLANT
DRSG TEGADERM 2-3/8X2-3/4 SM (GAUZE/BANDAGES/DRESSINGS) ×10 IMPLANT
DRSG TEGADERM 4X4.75 (GAUZE/BANDAGES/DRESSINGS) IMPLANT
ELECT PENCIL ROCKER SW 15FT (MISCELLANEOUS) ×8 IMPLANT
ELECT REM PT RETURN 15FT ADLT (MISCELLANEOUS) ×4 IMPLANT
ENDOLOOP SUT PDS II  0 18 (SUTURE)
ENDOLOOP SUT PDS II 0 18 (SUTURE) IMPLANT
EVACUATOR SILICONE 100CC (DRAIN) IMPLANT
GAUZE SPONGE 2X2 8PLY STRL LF (GAUZE/BANDAGES/DRESSINGS) ×2 IMPLANT
GAUZE SPONGE 4X4 12PLY STRL (GAUZE/BANDAGES/DRESSINGS) IMPLANT
GLOVE ECLIPSE 8.0 STRL XLNG CF (GLOVE) ×12 IMPLANT
GLOVE INDICATOR 8.0 STRL GRN (GLOVE) ×12 IMPLANT
GOWN STRL REUS W/TWL XL LVL3 (GOWN DISPOSABLE) ×20 IMPLANT
GRASPER ENDOPATH ANVIL 10MM (MISCELLANEOUS) IMPLANT
HOLDER FOLEY CATH W/STRAP (MISCELLANEOUS) ×4 IMPLANT
IRRIG SUCT STRYKERFLOW 2 WTIP (MISCELLANEOUS) ×4
IRRIGATION SUCT STRKRFLW 2 WTP (MISCELLANEOUS) ×2 IMPLANT
KIT PROCEDURE DA VINCI SI (MISCELLANEOUS) ×2
KIT PROCEDURE DVNC SI (MISCELLANEOUS) ×2 IMPLANT
LEGGING LITHOTOMY PAIR STRL (DRAPES) ×4 IMPLANT
LUBRICANT JELLY K Y 4OZ (MISCELLANEOUS) IMPLANT
NDL INSUFFLATION 14GA 120MM (NEEDLE) ×2 IMPLANT
NEEDLE INSUFFLATION 14GA 120MM (NEEDLE) ×4 IMPLANT
PACK CARDIOVASCULAR III (CUSTOM PROCEDURE TRAY) ×4 IMPLANT
PACK COLON (CUSTOM PROCEDURE TRAY) ×4 IMPLANT
PAD POSITIONING PINK XL (MISCELLANEOUS) ×4 IMPLANT
PORT LAP GEL ALEXIS MED 5-9CM (MISCELLANEOUS) ×4 IMPLANT
RELOAD STAPLE 45 BLU REG DVNC (STAPLE) IMPLANT
RELOAD STAPLE 45 GRN THCK DVNC (STAPLE) IMPLANT
RETRACTOR WND ALEXIS 18 MED (MISCELLANEOUS) IMPLANT
RTRCTR WOUND ALEXIS 18CM MED (MISCELLANEOUS)
SCISSORS LAP 5X35 DISP (ENDOMECHANICALS) ×4 IMPLANT
SEAL CANN UNIV 5-8 DVNC XI (MISCELLANEOUS) ×6 IMPLANT
SEAL XI 5MM-8MM UNIVERSAL (MISCELLANEOUS) ×6
SEALER VESSEL DA VINCI XI (MISCELLANEOUS) ×2
SEALER VESSEL EXT DVNC XI (MISCELLANEOUS) ×2 IMPLANT
SLEEVE ADV FIXATION 5X100MM (TROCAR) ×4 IMPLANT
SOLUTION ELECTROLUBE (MISCELLANEOUS) ×4 IMPLANT
SPONGE GAUZE 2X2 STER 10/PKG (GAUZE/BANDAGES/DRESSINGS) ×2
STAPLER 45 BLU RELOAD XI (STAPLE) IMPLANT
STAPLER 45 BLUE RELOAD XI (STAPLE)
STAPLER 45 GREEN RELOAD XI (STAPLE) ×4
STAPLER 45 GRN RELOAD XI (STAPLE) ×4 IMPLANT
STAPLER CANNULA SEAL DVNC XI (STAPLE) ×2 IMPLANT
STAPLER CANNULA SEAL XI (STAPLE) ×2
STAPLER CIRC ILS CVD 33MM 37CM (STAPLE) ×3 IMPLANT
STAPLER SHEATH (SHEATH) ×2
STAPLER SHEATH ENDOWRIST DVNC (SHEATH) ×2 IMPLANT
SUT MNCRL AB 4-0 PS2 18 (SUTURE) ×4 IMPLANT
SUT PDS AB 1 CTX 36 (SUTURE) ×8 IMPLANT
SUT PDS AB 1 TP1 96 (SUTURE) IMPLANT
SUT PDS AB 2-0 CT2 27 (SUTURE) IMPLANT
SUT PROLENE 0 CT 2 (SUTURE) ×4 IMPLANT
SUT PROLENE 2 0 KS (SUTURE) ×4 IMPLANT
SUT PROLENE 2 0 SH DA (SUTURE) IMPLANT
SUT SILK 2 0 (SUTURE) ×4
SUT SILK 2 0 SH CR/8 (SUTURE) ×4 IMPLANT
SUT SILK 2-0 18XBRD TIE 12 (SUTURE) ×2 IMPLANT
SUT SILK 3 0 (SUTURE) ×4
SUT SILK 3 0 SH CR/8 (SUTURE) ×4 IMPLANT
SUT SILK 3-0 18XBRD TIE 12 (SUTURE) ×2 IMPLANT
SUT V-LOC BARB 180 2/0GR6 GS22 (SUTURE)
SUT VIC AB 3-0 SH 18 (SUTURE) IMPLANT
SUT VIC AB 3-0 SH 27 (SUTURE)
SUT VIC AB 3-0 SH 27XBRD (SUTURE) IMPLANT
SUT VICRYL 0 UR6 27IN ABS (SUTURE) ×4 IMPLANT
SUTURE V-LC BRB 180 2/0GR6GS22 (SUTURE) IMPLANT
SYR 10ML LL (SYRINGE) ×4 IMPLANT
SYS LAPSCP GELPORT 120MM (MISCELLANEOUS)
SYSTEM LAPSCP GELPORT 120MM (MISCELLANEOUS) IMPLANT
TAPE UMBILICAL COTTON 1/8X30 (MISCELLANEOUS) ×4 IMPLANT
TOWEL OR 17X26 10 PK STRL BLUE (TOWEL DISPOSABLE) IMPLANT
TOWEL OR NON WOVEN STRL DISP B (DISPOSABLE) ×4 IMPLANT
TRAY FOLEY W/METER SILVER 16FR (SET/KITS/TRAYS/PACK) IMPLANT
TROCAR ADV FIXATION 5X100MM (TROCAR) IMPLANT
TUBING CONNECTING 10 (TUBING) IMPLANT
TUBING CONNECTING 10' (TUBING)
TUBING INSUFFLATION 10FT LAP (TUBING) ×4 IMPLANT

## 2017-10-10 NOTE — Anesthesia Postprocedure Evaluation (Signed)
Anesthesia Post Note  Patient: Paul [name]  Procedure(s) Performed: XI ROBOTIC ASSISTED LOWER ANTERIOR RESECTION (N/A Abdomen) RIGID PROCTOSCOPY (N/A Rectum)     Patient location during evaluation: PACU Anesthesia Type: General Level of consciousness: awake and alert Pain management: pain level controlled Vital Signs Assessment: post-procedure vital signs reviewed and stable Respiratory status: spontaneous breathing, nonlabored ventilation, respiratory function stable and patient connected to nasal cannula oxygen Cardiovascular status: blood pressure returned to baseline and stable Postop Assessment: no apparent nausea or vomiting Anesthetic complications: no    Last Vitals:  Vitals:   10/10/17 1345 10/10/17 1400  BP: 122/80 126/87  Pulse: 80 70  Resp: 11 13  Temp: (!) 35.6 C (!) 36.3 C  SpO2: 100% 100%    Last Pain:  Vitals:   10/10/17 1415  TempSrc:   PainSc: 5                  Jackqueline Aquilar

## 2017-10-10 NOTE — Transfer of Care (Signed)
Immediate Anesthesia Transfer of Care Note  Patient: Paul [name]  Procedure(s) Performed: XI ROBOTIC ASSISTED LOWER ANTERIOR RESECTION (N/A Abdomen) RIGID PROCTOSCOPY (N/A Rectum)  Patient Location: PACU  Anesthesia Type:General  Level of Consciousness: sedated  Airway & Oxygen Therapy: Patient Spontanous Breathing and Patient connected to face mask oxygen  Post-op Assessment: Report given to RN and Post -op Vital signs reviewed and stable  Post vital signs: Reviewed and stable  Last Vitals:  Vitals:   10/10/17 0750  BP: 139/90  Pulse: 70  Resp: 18  Temp: 36.5 C  SpO2: 100%    Last Pain:  Vitals:   10/10/17 0814  TempSrc:   PainSc: 0-No pain         Complications: No apparent anesthesia complications

## 2017-10-10 NOTE — Discharge Instructions (Signed)
SURGERY: POST OP INSTRUCTIONS °(Surgery for small bowel obstruction, colon resection, etc) ° ° °###################################################################### ° °EAT °Gradually transition to a high fiber diet with a fiber supplement over the next few days after discharge ° °WALK °Walk an hour a day.  Control your pain to do that.   ° °CONTROL PAIN °Control pain so that you can walk, sleep, tolerate sneezing/coughing, go up/down stairs. ° °HAVE A BOWEL MOVEMENT DAILY °Keep your bowels regular to avoid problems.  OK to try a laxative to override constipation.  OK to use an antidairrheal to slow down diarrhea.  Call if not better after 2 tries ° °CALL IF YOU HAVE PROBLEMS/CONCERNS °Call if you are still struggling despite following these instructions. °Call if you have concerns not answered by these instructions ° °###################################################################### ° ° °DIET °Follow a light diet the first few days at home.  Start with a bland diet such as soups, liquids, starchy foods, low fat foods, etc.  If you feel full, bloated, or constipated, stay on a ful liquid or pureed/blenderized diet for a few days until you feel better and no longer constipated. °Be sure to drink plenty of fluids every day to avoid getting dehydrated (feeling dizzy, not urinating, etc.). °Gradually add a fiber supplement to your diet over the next week.  Gradually get back to a regular solid diet.  Avoid fast food or heavy meals the first week as you are more likely to get nauseated. °It is expected for your digestive tract to need a few months to get back to normal.  It is common for your bowel movements and stools to be irregular.  You will have occasional bloating and cramping that should eventually fade away.  Until you are eating solid food normally, off all pain medications, and back to regular activities; your bowels will not be normal. °Focus on eating a low-fat, high fiber diet the rest of your life  (See Getting to Good Bowel Health, below). ° °CARE of your INCISION or WOUND °It is good for closed incision and even open wounds to be washed every day.  Shower every day.  Short baths are fine.  Wash the incisions and wounds clean with soap & water.    °If you have a closed incision(s), wash the incision with soap & water every day.  You may leave closed incisions open to air if it is dry.   You may cover the incision with clean gauze & replace it after your daily shower for comfort. °If you have skin tapes (Steristrips) or skin glue (Dermabond) on your incision, leave them in place.  They will fall off on their own like a scab.  You may trim any edges that curl up with clean scissors.  If you have staples, set up an appointment for them to be removed in the office in 10 days after surgery.  °If you have a drain, wash around the skin exit site with soap & water and place a new dressing of gauze or band aid around the skin every day.  Keep the drain site clean & dry.    °If you have an open wound with packing, see wound care instructions.  In general, it is encouraged that you remove your dressing and packing, shower with soap & water, and replace your dressing once a day.  Pack the wound with clean gauze moistened with normal (0.9%) saline to keep the wound moist & uninfected.  Pressure on the dressing for 30 minutes will stop most wound   bleeding.  Eventually your body will heal & pull the open wound closed over the next few months.  °Raw open wounds will occasionally bleed or secrete yellow drainage until it heals closed.  Drain sites will drain a little until the drain is removed.  Even closed incisions can have mild bleeding or drainage the first few days until the skin edges scab over & seal.   °If you have an open wound with a wound vac, see wound vac care instructions. ° ° ° ° °ACTIVITIES as tolerated °Start light daily activities --- self-care, walking, climbing stairs-- beginning the day after surgery.   Gradually increase activities as tolerated.  Control your pain to be active.  Stop when you are tired.  Ideally, walk several times a day, eventually an hour a day.   °Most people are back to most day-to-day activities in a few weeks.  It takes 4-8 weeks to get back to unrestricted, intense activity. °If you can walk 30 minutes without difficulty, it is safe to try more intense activity such as jogging, treadmill, bicycling, low-impact aerobics, swimming, etc. °Save the most intensive and strenuous activity for last (Usually 4-8 weeks after surgery) such as sit-ups, heavy lifting, contact sports, etc.  Refrain from any intense heavy lifting or straining until you are off narcotics for pain control.  You will have off days, but things should improve week-by-week. °DO NOT PUSH THROUGH PAIN.  Let pain be your guide: If it hurts to do something, don't do it.  Pain is your body warning you to avoid that activity for another week until the pain goes down. °You may drive when you are no longer taking narcotic prescription pain medication, you can comfortably wear a seatbelt, and you can safely make sudden turns/stops to protect yourself without hesitating due to pain. °You may have sexual intercourse when it is comfortable. If it hurts to do something, stop. ° °MEDICATIONS °Take your usually prescribed home medications unless otherwise directed.   °Blood thinners:  °Usually you can restart any strong blood thinners after the second postoperative day.  It is OK to take aspirin right away.    ° If you are on strong blood thinners (warfarin/Coumadin, Plavix, Xerelto, Eliquis, Pradaxa, etc), discuss with your surgeon, medicine PCP, and/or cardiologist for instructions on when to restart the blood thinner & if blood monitoring is needed (PT/INR blood check, etc).   ° ° °PAIN CONTROL °Pain after surgery or related to activity is often due to strain/injury to muscle, tendon, nerves and/or incisions.  This pain is usually  short-term and will improve in a few months.  °To help speed the process of healing and to get back to regular activity more quickly, DO THE FOLLOWING THINGS TOGETHER: °1. Increase activity gradually.  DO NOT PUSH THROUGH PAIN °2. Use Ice and/or Heat °3. Try Gentle Massage and/or Stretching °4. Take over the counter pain medication °5. Take Narcotic prescription pain medication for more severe pain ° °Good pain control = faster recovery.  It is better to take more medicine to be more active than to stay in bed all day to avoid medications. °1.  Increase activity gradually °Avoid heavy lifting at first, then increase to lifting as tolerated over the next 6 weeks. °Do not “push through” the pain.  Listen to your body and avoid positions and maneuvers than reproduce the pain.  Wait a few days before trying something more intense °Walking an hour a day is encouraged to help your body recover faster   and more safely.  Start slowly and stop when getting sore.  If you can walk 30 minutes without stopping or pain, you can try more intense activity (running, jogging, aerobics, cycling, swimming, treadmill, sex, sports, weightlifting, etc.) °Remember: If it hurts to do it, then don’t do it! °2. Use Ice and/or Heat °You will have swelling and bruising around the incisions.  This will take several weeks to resolve. °Ice packs or heating pads (6-8 times a day, 30-60 minutes at a time) will help sooth soreness & bruising. °Some people prefer to use ice alone, heat alone, or alternate between ice & heat.  Experiment and see what works best for you.  Consider trying ice for the first few days to help decrease swelling and bruising; then, switch to heat to help relax sore spots and speed recovery. °Shower every day.  Short baths are fine.  It feels good!  Keep the incisions and wounds clean with soap & water.   °3. Try Gentle Massage and/or Stretching °Massage at the area of pain many times a day °Stop if you feel pain - do not  overdo it °4. Take over the counter pain medication °This helps the muscle and nerve tissues become less irritable and calm down faster °Choose ONE of the following over-the-counter anti-inflammatory medications: °Acetaminophen 500mg tabs (Tylenol) 1-2 pills with every meal and just before bedtime (avoid if you have liver problems or if you have acetaminophen in you narcotic prescription) °Naproxen 220mg tabs (ex. Aleve, Naprosyn) 1-2 pills twice a day (avoid if you have kidney, stomach, IBD, or bleeding problems) °Ibuprofen 200mg tabs (ex. Advil, Motrin) 3-4 pills with every meal and just before bedtime (avoid if you have kidney, stomach, IBD, or bleeding problems) °Take with food/snack several times a day as directed for at least 2 weeks to help keep pain / soreness down & more manageable. °5. Take Narcotic prescription pain medication for more severe pain °A prescription for strong pain control is often given to you upon discharge (for example: oxycodone/Percocet, hydrocodone/Norco/Vicodin, or tramadol/Ultram) °Take your pain medication as prescribed. °Be mindful that most narcotic prescriptions contain Tylenol (acetaminophen) as well - avoid taking too much Tylenol. °If you are having problems/concerns with the prescription medicine (does not control pain, nausea, vomiting, rash, itching, etc.), please call us [phone] to see if we need to switch you to a different pain medicine that will work better for you and/or control your side effects better. °If you need a refill on your pain medication, you must call the office before 4 pm and on weekdays only.  By federal law, prescriptions for narcotics cannot be called into a pharmacy.  They must be filled out on paper & picked up from our office by the patient or authorized caretaker.  Prescriptions cannot be filled after 4 pm nor on weekends.   ° °WHEN TO CALL US [phone] °Severe uncontrolled or worsening pain  °Fever over 101 F (38.5 C) °Concerns with  the incision: Worsening pain, redness, rash/hives, swelling, bleeding, or drainage °Reactions / problems with new medications (itching, rash, hives, nausea, etc.) °Nausea and/or vomiting °Difficulty urinating °Difficulty breathing °Worsening fatigue, dizziness, lightheadedness, blurred vision °Other concerns °If you are not getting better after two weeks or are noticing you are getting worse, contact our office [phone] for further advice.  We may need to adjust your medications, re-evaluate you in the office, send you to the emergency room, or see what other things we can do to help. °The   clinic staff is available to answer your questions during regular business hours (8:30am-5pm).  Please don’t hesitate to call and ask to speak to one of our nurses for clinical concerns.    °A surgeon from [facility] is always on call at the hospitals 24 hours/day °If you have a medical emergency, go to the nearest emergency room or call 911. ° °FOLLOW UP in our office °One the day of your discharge from the hospital (or the next business weekday), please call [facility] to set up or confirm an appointment to see your surgeon in the office for a follow-up appointment.  Usually it is 2-3 weeks after your surgery.   °If you have skin staples at your incision(s), let the office know so we can set up a time in the office for the nurse to remove them (usually around 10 days after surgery). °Make sure that you call for appointments the day of discharge (or the next business weekday) from the hospital to ensure a convenient appointment time. °IF YOU HAVE DISABILITY OR FAMILY LEAVE FORMS, BRING THEM TO THE OFFICE FOR PROCESSING.  DO NOT GIVE THEM TO YOUR DOCTOR. ° °[facility] °[address], [address], [place], [place]  [address] ? °[phone] - Main °[phone] - Toll Free,  [phone] - Fax °[email or web address] ° °GETTING TO GOOD BOWEL HEALTH. °It is  expected for your digestive tract to need a few months to get back to normal.  It is common for your bowel movements and stools to be irregular.  You will have occasional bloating and cramping that should eventually fade away.  Until you are eating solid food normally, off all pain medications, and back to regular activities; your bowels will not be normal.   °Avoiding constipation °The goal: ONE SOFT BOWEL MOVEMENT A DAY!    °Drink plenty of fluids.  Choose water first. °TAKE A FIBER SUPPLEMENT EVERY DAY THE REST OF YOUR LIFE °During your first week back home, gradually add back a fiber supplement every day °Experiment which form you can tolerate.   There are many forms such as powders, tablets, wafers, gummies, etc °Psyllium bran (Metamucil), methylcellulose (Citrucel), Miralax or Glycolax, Benefiber, Flax Seed.  °Adjust the dose week-by-week (1/2 dose/day to 6 doses a day) until you are moving your bowels 1-2 times a day.  Cut back the dose or try a different fiber product if it is giving you problems such as diarrhea or bloating. °Sometimes a laxative is needed to help jump-start bowels if constipated until the fiber supplement can help regulate your bowels.  If you are tolerating eating & you are farting, it is okay to try a gentle laxative such as double dose MiraLax, prune juice, or Milk of Magnesia.  Avoid using laxatives too often. °Stool softeners can sometimes help counteract the constipating effects of narcotic pain medicines.  It can also cause diarrhea, so avoid using for too long. °If you are still constipated despite taking fiber daily, eating solids, and a few doses of laxatives, call our office. °Controlling diarrhea °Try drinking liquids and eating bland foods for a few days to avoid stressing your intestines further. °Avoid dairy products (especially milk & ice cream) for a short time.  The intestines often can lose the ability to digest lactose when stressed. °Avoid foods that cause gassiness or  bloating.  Typical foods include beans and other legumes, cabbage, broccoli, and dairy foods.  Avoid greasy, spicy, fast foods.  Every person has   some sensitivity to other foods, so listen to your body and avoid those foods that trigger problems for you. °Probiotics (such as active yogurt, Align, etc) may help repopulate the intestines and colon with normal bacteria and calm down a sensitive digestive tract °Adding a fiber supplement gradually can help thicken stools by absorbing excess fluid and retrain the intestines to act more normally.  Slowly increase the dose over a few weeks.  Too much fiber too soon can backfire and cause cramping & bloating. °It is okay to try and slow down diarrhea with a few doses of antidiarrheal medicines.   °Bismuth subsalicylate (ex. Kayopectate, Pepto Bismol) for a few doses can help control diarrhea.  Avoid if pregnant.   °Loperamide (Imodium) can slow down diarrhea.  Start with one tablet (2mg) first.  Avoid if you are having fevers or severe pain.  °ILEOSTOMY PATIENTS WILL HAVE CHRONIC DIARRHEA since their colon is not in use.    °Drink plenty of liquids.  You will need to drink even more glasses of water/liquid a day to avoid getting dehydrated. °Record output from your ileostomy.  Expect to empty the bag every 3-4 hours at first.  Most people with a permanent ileostomy empty their bag 4-6 times at the least.   °Use antidiarrheal medicine (especially Imodium) several times a day to avoid getting dehydrated.  Start with a dose at bedtime & breakfast.  Adjust up or down as needed.  Increase antidiarrheal medications as directed to avoid emptying the bag more than 8 times a day (every 3 hours). °Work with your wound ostomy nurse to learn care for your ostomy.  See ostomy care instructions. °TROUBLESHOOTING IRREGULAR BOWELS °1) Start with a soft & bland diet. No spicy, greasy, or fried foods.  °2) Avoid gluten/wheat or dairy products from diet to see if symptoms improve. °3) Miralax  17gm or flax seed mixed in 8oz. water or juice-daily. May use 2-4 times a day as needed. °4) Gas-X, Phazyme, etc. as needed for gas & bloating.  °5) Prilosec (omeprazole) over-the-counter as needed °6)  Consider probiotics (Align, Activa, etc) to help calm the bowels down ° °Call your doctor if you are getting worse or not getting better.  Sometimes further testing (cultures, endoscopy, X-ray studies, CT scans, bloodwork, etc.) may be needed to help diagnose and treat the cause of the diarrhea. °[facility] °[address], [address], [place], [place]  [address] °[phone] - Main.    °[phone]  - Toll Free.   [phone] - Fax °[email or web address] ° ° ° °Colorectal Cancer °Colorectal cancer is an abnormal growth of cells and tissue (tumor) in the colon or rectum, which are parts of the large intestine. The cancer can spread (metastasize) to other parts of the body. °What are the causes? °Most cases of colorectal cancer start as abnormal growths called polyps on the inner wall of the colon or rectum. Other times, abnormal changes to genes (genetic mutations) can cause cells to form cancer. °What increases the risk? °You are more likely to develop this condition if: °· You are older than age 50. °· You have multiple polyps in the colon or rectum. °· You have diabetes. °· You are African American. °· You have a family history of Lynch syndrome. °· You have had cancer before. °· You have certain hereditary conditions, such as: °? Familial adenomatous polyposis. °? Turcot syndrome. °? Peutz-Jeghers syndrome. °· You eat a diet that is high in fat (especially animal fat) and   low in fiber, fruits, and vegetables. °· You have an inactive (sedentary) lifestyle. °· You have an inflammatory bowel disease or Crohn disease. °· You smoke. °· You drink alcohol excessively. ° °What are the signs or symptoms? °Early colorectal cancer often does not cause symptoms. As the cancer grows,  symptoms may include: °· Changes in bowel habits. °· Feeling like the bowel does not empty completely after a bowel movement. °· Stools that are narrower than usual. °· Blood in the stool. °· Diarrhea. °· Constipation. °· Anemia. °· Discomfort, pain, bloating, fullness, or cramps in the abdomen. °· Frequent gas pain. °· Unexplained weight loss. °· Constant fatigue. °· Nausea and vomiting. ° °How is this diagnosed? °This condition may be diagnosed with: °· A medical history. °· A physical exam. °· Tests. These may include: °? A digital rectal exam. °? A stool test called a fecal occult blood test. °? Blood tests. °? A test in which a tissue sample is taken from the colon or rectum and examined under a microscope (biopsy). °? Imaging tests, such as: °§ X-rays. °§ A barium enema. °§ CT scans. °§ MRIs. °§ A sigmoidoscopy. This test is done to view the inside of the rectum. °§ A colonoscopy. This test is done to view the inside of the colon. During this test, small polyps can be removed or biopsies may be taken. °§ An endorectal ultrasound. This test checks how deep a tumor in the rectum has grown and whether the cancer has spread to lymph nodes or other nearby tissues. ° °Your health care provider may order additional tests to find out whether the cancer has spread to other parts of the body (what stage it is). The stages of cancer include: °· Stage 0. At this stage, the cancer is found only in the innermost lining of the colon or rectum. °· Stage I. At this stage, the cancer has grown into the inner wall of the colon or rectum. °· Stage II. At this stage, the cancer has gone more deeply into the wall of the colon or rectum or through the wall. It may have invaded nearby tissue. °· Stage III. At this stage, the cancer has spread to nearby lymph nodes. °· Stage IV. At this stage, the cancer has spread to other parts of the body, such as the liver or lungs. ° °How is this treated? °Treatment for this condition depends on  the type and stage of the cancer. Treatment may include: °· Surgery. In the early stages of the cancer, surgery may be done to remove polyps or small tumors from the colon. In later stages, surgery may be done to remove part of the colon. °· Chemotherapy. This treatment uses medicines to kill cancer cells. °· Targeted therapy. This treatment targets specific gene mutations or proteins that the cancer expresses in order to kill tumor cells. °· Radiation therapy. This treatment uses radiation to kill cancer cells or shrink tumors. °· Radiofrequency ablation. This treatment uses radio waves to destroy the tumors that may have spread to other areas of the body, such as the liver. ° °Follow these instructions at home: °· Take over-the-counter and prescription medicines only as told by your health care provider. °· Try to eat regular, healthy meals. Some of your treatments might affect your appetite. If you are having problems eating or with your appetite, ask to meet with a food and nutrition specialist (dietitian). °· Consider joining a support group. This may help you learn about your diagnosis and cope   with the stress of having colorectal cancer. °· If you are admitted to the hospital, inform your cancer care team. °· Keep all follow-up visits as told by your health care provider. This is important. °How is this prevented? °· Colorectal cancer can be prevented with screening tests that find polyps so they can be removed before they develop into cancer. °· Most people with average risk of colorectal cancer should start screening at age 50. People at increased risk should start screening at an earlier age. °Where to find more information: °· American Cancer Society: https://www.cancer.org °· National Cancer Institute (NCI): https://www.cancer.gov °Contact a health care provider if: °· Your diarrhea or constipation does not go away. °· You have blood in your stool. °· Your bowel habits change. °· You have increased pain  in your abdomen. °· You notice new fatigue or weakness. °· You lose weight. °Get help right away if: °· You have increased bleeding from your rectum. °· You have any uncontrollable or severe abdomen (abdominal) symptoms. °Summary °· Colorectal cancer is an abnormal growth of cells and tissue (tumor) in the colon or rectum. °· Common risk factors for this condition include having a relative with colon cancer, being older in age, having an inflammatory bowel disease, and being African American. °· This condition may be diagnosed with tests, such as a colonoscopy and biopsy. °· Treatment depends on the type and stage of the cancer. Commonly, treatment includes surgery to remove the tumor along with chemotherapy or targeted therapy. °· Keep all follow-up visits as told by your health care provider. This is important. °This information is not intended to replace advice given to you by your health care provider. Make sure you discuss any questions you have with your health care provider. °Document Released: 10/09/2005 Document Revised: 11/10/2016 Document Reviewed: 11/10/2016 °[employer] Interactive Patient Education © 2018 [employer] ° °

## 2017-10-10 NOTE — Op Note (Signed)
10/10/2017  1:30 PM  PATIENT:  Paul [name]  51 y.o. male  Patient Care Team: Eulas Post, MD as PCP - General (Family Medicine) Arta Silence, MD as Consulting Physician (Gastroenterology) Michael Boston, MD as Consulting Physician (General Surgery) Kyung Rudd, MD as Consulting Physician (Radiation Oncology) Truitt Merle, MD as Consulting Physician (Oncology)  PRE-OPERATIVE DIAGNOSIS:  mid rectal cancer  POST-OPERATIVE DIAGNOSIS:  Mid rectal cancer  PROCEDURE:  XI ROBOTIC ASSISTED LOWER ANTERIOR RESECTION RIGID PROCTOSCOPY  SURGEON:  [name], MD  ASSISTANT: Nadeen Landau, MD   ANESTHESIA:   local and general  EBL:  Total I/O In: 2000 [I.V.:2000] Out: 300 [Urine:175; Blood:125]  Delay start of Pharmacological VTE agent (>24hrs) due to surgical blood loss or risk of bleeding:  no  DRAINS: none   SPECIMEN: Rectosigmoid colon.  Open end proximal Distal anastomotic ring is final distal margin  DISPOSITION OF SPECIMEN:  PATHOLOGY  COUNTS:  YES  PLAN OF CARE: Admit to inpatient   PATIENT DISPOSITION:  PACU - hemodynamically stable.  INDICATION:    Pleasant gentleman originally from [place] who developed rectal cancer.  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  Underwent neoadjuvant chemoradiation therapy.  Is now 10 weeks out and is ready for segmental resection.  I recommended segmental resection:  The anatomy & physiology of the digestive tract was discussed.  The pathophysiology was discussed.  Natural history risks without surgery was discussed.   I worked to give an overview of the disease and the frequent need to have multispecialty involvement.  I feel the risks of no intervention will lead to serious problems that outweigh the operative risks; therefore, I recommended a partial colectomy to remove the pathology.  Laparoscopic & open techniques were discussed.   Risks such as bleeding, infection, abscess, leak, reoperation, possible ostomy, hernia, heart  attack, death, and other risks were discussed.  I noted a good likelihood this will help address the problem.   Goals of post-operative recovery were discussed as well.  We will work to minimize complications.  Educational materials on the pathology had been given in the office.  Questions were answered.    The patient expressed understanding & wished to proceed with surgery.  OR FINDINGS:   Patient had residual tumor/scar in the left lateral wall mid rectum.  About the level of the peritoneal reflection  No obvious metastatic disease on visceral parietal peritoneum or liver.  The anastomosis rests 7 cm from the anal verge by rigid proctoscopy.  DESCRIPTION:   Informed consent was confirmed.  The patient underwent general anaesthesia without difficulty.  The patient was positioned appropriately.  VTE prevention in place.  The patient's abdomen was clipped, prepped, & draped in a sterile fashion.  Surgical timeout confirmed our plan.  The patient was positioned in reverse Trendelenburg.  Abdominal entry was gained using Varess technique with a trach hook on the anterior abdominal wall fascia in the left upper abdomen.  Entry was clean.  I induced carbon dioxide insufflation.  Camera inspection revealed no injury.  Extra ports were carefully placed under direct laparoscopic visualization. I reflected the greater omentum and the upper abdomen the small bowel in the upper abdomen.  The patient was carefully positioned.  The Intuitive daVinci robot was carefully docked with camera & instruments carefully placed.  The patient had no evidence of any significant adhesions nor metastatic disease.   I could feel and suspect dimpling in the anterior rectum just proximal to the peritoneal reflection I scored the base  of peritoneum of the medial side of the mesentery of the left colon from the ligament of Treitz to the peritoneal reflection of the mid rectum.   I elevated the sigmoid mesentery and entered into  the retro-mesenteric plane. We were able to identify the left ureter and gonadal vessels. We kept those posterior within the retroperitoneum and elevated the left colon mesentery off that. I did isolate the inferior mesenteric artery (IMA) pedicle but did not ligate it yet.  I continued distally and got into the avascular plane posterior to the mesorectum. This allowed me to help mobilize the rectum as well by freeing the mesorectum off the sacrum.  I mobilized the peritoneal coverings towards the peritoneal reflection on both the right and left sides of the rectum.  I stayed away from the right and left ureters.  I transected lateral vascular pedicles of the proximal and mid rectum distal to the scar.  I did rigid proctoscopy to confirm that there was residual tumor but mostly scar in the left lateral mid rectum.  Hold tattoo seen distal to the residual tumor scar.   I skeletonized the lymph nodes off the inferior mesenteric artery pedicle.  I went down to its takeoff from the aorta.  I isolated the inferior mesenteric vein off of the ligament of Treitz just cephalad to that as well.  After confirming the left ureter was out of the way, I went ahead and ligated the inferior mesenteric artery pedicle just near its takeoff from the aorta.  I did ligate the inferior mesenteric vein in a similar fashion.  We ensured hemostasis. I skeletonized the mesorectum at the junction at the mid and distal rectum for the distal point of resection.  I mobilized the left colon in a lateral to medial fashion off the line of Toldt up towards the splenic flexure to ensure good mobilization of the remaining left colon to reach into the pelvis.  I skeletonized at the proximal mesorectum and transected at the proximal rectum using a robotic 45 mm stapler.  I chose a region at the descending/sigmoid junction that was soft and easily reached down to the rectal stump.  I transected the mesentery of the colon radially to preserve  remaining colon blood supply.  I created an extraction incision through a small Pfannenstiel incision in the suprapubic region.  Placed a wound protector.  I was able to eviscerate the rectosigmoid and descending colon out the wound.   I clamped the colon proximal to this area using a reusable pursestringer device.  Passed a 2-0 Keith needle. I transected at the descending/sigmoid junction with a scalpel. I got healthy bleeding mucosa.  We sent the rectosigmoid colon specimen off to go to pathology.  We sized the colon orifice.  I chose a 33 EEA anvil stapler system.  I reinforced the prolene pursestring with interrupted silk suture.  I placed the anvil to the open end of the proximal remaining colon and closed around it using the pursestring.    We did copious irrigation with crystalloid solution.  Hemostasis was good.  The distal end of the remaining colon easily reached down to the rectal stump, therefore, splenic flexure mobilization was not needed.      Dr Dema Severin scrubbed down and did gentle anal dilation and advanced the EEA stapler up the rectal stump. The spike was brought out at the provimal end of the rectal stump under direct visualization.  I attached the anvil of the proximal colon the spike of the  stapler. Anvil was tightened down and held clamped for 60 seconds. The EEA stapler was fired and held clamped for 30 seconds. The stapler was released & removed. We noted 2 excellent anastomotic rings. Blue stitch is in the proximal ring.  Dr Dema Severin did rigid proctoscopy noted the anastomosis was at 7-8 cm from the anal verge consistent with the proximal rectum.  We did a final irrigation of antibiotic solution (900 mg clindamycin/240 mg gentamicin in a liter of crystalloid) & held that for the pelvic air leak test .  The rectum was insufflated the rectum while clamping the colon proximal to that anastomosis.  There was a negative air leak test. There was no tension of mesentery or bowel at the anastomosis.    Tissues looked viable.  Ureters & bowel uninjured.  The anastomosis looked healthy.  I closed the 12 mm stapler port site with a 0 Vicryl using a laparoscopic suture passer under direct visualization.  Endoluminal gas was evacuated.  Ports & wound protector removed.  We changed gloves & redraped the patient per colon SSI prevention protocol.  We aspirated the antibiotic irrigation.  Hemostasis was good.  Sterile unused instruments were used from this point.  I closed the skin at the port sites using Monocryl stitch and sterile dressing.  I closed the extraction wound using a 0 Vicryl vertical peritoneal closure and a #1 PDS transverse anterior rectal fascial closure like a small Pfannenstiel closure. I closed the skin with some interrupted Monocryl stitches. I placed antibiotic-soaked wicks into the closure at the corners x2.  I placed sterile dressings.     Patient is being extubated go to recovery room. I had discussed postop care with the patient in detail the office & in the holding area. Instructions are written.  I'm about to locate family and discuss it with them as well.  [name], M.D., F.A.C.S. Gastrointestinal and Minimally Invasive Surgery [facility] [address], [place], [place] [address] [phone] Main / Paging

## 2017-10-10 NOTE — Anesthesia Procedure Notes (Signed)
Procedure Name: Intubation Date/Time: 10/10/2017 10:41 AM Performed by: Talbot Grumbling, CRNA Pre-anesthesia Checklist: Patient identified, Emergency Drugs available, Patient being monitored and Suction available Patient Re-evaluated:Patient Re-evaluated prior to induction Oxygen Delivery Method: Circle system utilized Preoxygenation: Pre-oxygenation with 100% oxygen Induction Type: IV induction Ventilation: Mask ventilation without difficulty Laryngoscope Size: Mac and 4 Grade View: Grade I Tube size: 8.0 mm Number of attempts: 1 Airway Equipment and Method: Stylet Placement Confirmation: ETT inserted through vocal cords under direct vision,  positive ETCO2 and breath sounds checked- equal and bilateral Secured at: 21 cm Tube secured with: Tape Dental Injury: Teeth and Oropharynx as per pre-operative assessment

## 2017-10-10 NOTE — Progress Notes (Signed)
Received pt from PACU. Pt is alert and oriented x4. Vital signs within normal limits. Pt denies pain, no resp distress noted. Family members at bedside. Pt and family member oriented to room and unit. Will continue to monitor pt.

## 2017-10-10 NOTE — Interval H&P Note (Signed)
History and Physical Interval Note:  10/10/2017 10:24 AM   I have re-reviewed the the patient's records, history, medications, and allergies.  I have re-examined the patient.  I again discussed intraoperative plans and goals of post-operative recovery.  The patient agrees to proceed.  Paul [name]  [birth year] [id number]  Patient Care Team: Eulas Post, MD as PCP - General (Family Medicine) Arta Silence, MD as Consulting Physician (Gastroenterology) Michael Boston, MD as Consulting Physician (General Surgery) Kyung Rudd, MD as Consulting Physician (Radiation Oncology)  Patient Active Problem List   Diagnosis Date Noted  . Iron deficiency anemia due to chronic blood loss 09/03/2017  . Genetic testing 08/30/2017  . Family history of prostate cancer   . Rectal adenocarcinoma ([place]) 07/10/2017  . Colonic mass 07/03/2017  . Essential hypertension 07/03/2017  . 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 Essential hypertension, benign 06/26/2014  . Elevated blood pressure 08/14/2013  . GERD (gastroesophageal reflux disease) 09/10/2012    Past Medical History:  Diagnosis Date  . Allergy    grass, dander  . Colon cancer ([place]) 07/05/2017   rectal adenocarcinoma  . Family history of prostate cancer   . GERD (gastroesophageal reflux disease)   . History of meningitis   . Hypertension     Past Surgical History:  Procedure Laterality Date  . COLONOSCOPY WITH PROPOFOL Left 07/05/2017   Procedure: COLONOSCOPY WITH PROPOFOL;  Surgeon: Arta Silence, MD;  Location: [facility];  Service: Endoscopy;  Laterality: Left;    Social History   Socioeconomic History  . Marital status: Married    Spouse name: Not on file  . Number of children: Not on file  . Years of education: Not on file  . Highest education level: Not on file  Social Needs  . Financial resource strain: Not on file  . Food insecurity - worry: Not on file  . Food insecurity - inability:  Not on file  . Transportation needs - medical: Not on file  . Transportation needs - non-medical: Not on file  Occupational History  . Not on file  Tobacco Use  . Smoking status: Never Smoker  . Smokeless tobacco: Never Used  Substance and Sexual Activity  . Alcohol use: No  . Drug use: No  . Sexual activity: Not on file  Other Topics Concern  . Not on file  Social History Narrative   Originally from [place], [place]    Family History  Problem Relation Age of Onset  . Hypertension Mother   . Asthma Father   . Cancer Father        Prostate  . Asthma Son   . Asthma Son   . Asthma Daughter   . Dementia Maternal Grandmother     Medications Prior to Admission  Medication Sig Dispense Refill Last Dose  . amLODipine (NORVASC) 5 MG tablet TAKE 1 TABLET DAILY 90 tablet 1 10/10/2017 at 0710  . Calcium Citrate-Vitamin D (CALCIUM CITRATE+D3 PETITES PO) Take 1 tablet by mouth daily.   10/08/2017 at Unknown time  . cetirizine (ZYRTEC) 10 MG tablet Take 10 mg by mouth daily as needed for allergies.   Past Month at Unknown time  . GARLIC PO Take 1 capsule by mouth daily.    Past Week at Unknown time  . Multiple Vitamin (MULTIVITAMIN WITH MINERALS) TABS tablet Take 1 tablet by mouth daily.   10/08/2017 at Unknown time  . ondansetron (ZOFRAN) 8 MG tablet Take 1 tablet (8 mg total) by mouth every 8 (  eight) hours as needed for nausea or vomiting. 30 tablet 3 Past Month at Unknown time  . oxyCODONE (OXY IR/ROXICODONE) 5 MG immediate release tablet Take 1-2 tablets (5-10 mg total) every 4 (four) hours as needed by mouth for severe pain. 60 tablet 0 Past Month at Unknown time  . prochlorperazine (COMPAZINE) 10 MG tablet Take 1 tablet (10 mg total) by mouth every 6 (six) hours as needed for nausea or vomiting. 30 tablet 2 Past Month at Unknown time  . [DISCONTINUED] HYDROcodone-acetaminophen (NORCO) 5-325 MG tablet Take 1-2 tablets by mouth every 6 (six) hours as needed for moderate pain. 60  tablet 0 Past Month at Unknown time  . [DISCONTINUED] traMADol (ULTRAM) 50 MG tablet Take 50 mg by mouth every 6 (six) hours as needed for moderate pain or severe pain.   1 Past Month at Unknown time  . calcium carbonate (OS-CAL - DOSED IN MG OF ELEMENTAL CALCIUM) 1250 (500 Ca) MG tablet Take 1 tablet by mouth daily as needed (for bones).   Not Taking at Unknown time  . capecitabine (XELODA) 500 MG tablet Take 4 tabs (2000mg ) in AM & 3 tabs (1500mg ) in PM, within 3min of meals, days of radiation only, M-F (Patient not taking: Reported on 10/08/2017) 56 tablet 0 Completed Course at Unknown time  . hydrocortisone (ANUSOL-HC) 25 MG suppository Place 25 mg rectally 2 (two) times daily. Take 1 suppository 2x day for 1 week 1 box (24 in a box)called to [facility] on [address]   Not Taking at Unknown time  . omeprazole (PRILOSEC) 40 MG capsule Take 40 mg by mouth daily as needed (heart burn).    Not Taking at Unknown time  . psyllium (METAMUCIL) 58.6 % powder Take 1 packet by mouth 3 (three) times daily as needed.   Not Taking at Unknown time  . [DISCONTINUED] metroNIDAZOLE (FLAGYL) 500 MG tablet 500 mg.    Not Taking at Unknown time  . [DISCONTINUED] neomycin (MYCIFRADIN) 500 MG tablet    Not Taking at Unknown time    Current Facility-Administered Medications  Medication Dose Route Frequency Provider Last Rate Last Dose  . acetaminophen (TYLENOL) tablet 325-650 mg  325-650 mg Oral Q4H PRN Janeece Riggers, MD       Or  . acetaminophen (TYLENOL) solution 325-650 mg  325-650 mg Oral Q4H PRN Janeece Riggers, MD      . bupivacaine liposome (EXPAREL) 1.3 % injection 266 mg  20 mL Infiltration On Call to OR Michael Boston, MD      . cefoTEtan in Dextrose 5% (CEFOTAN) IVPB 2 g  2 g Intravenous On Call to OR Michael Boston, MD      . chlorhexidine (HIBICLENS) 4 % liquid 4 application  60 mL Topical Once Michael Boston, MD       And  . Derrill Memo ON 10/11/2017] chlorhexidine (HIBICLENS) 4 % liquid 4 application  60 mL Topical  Once Michael Boston, MD      . clindamycin (CLEOCIN) 900 mg, gentamicin (GARAMYCIN) 240 mg in sodium chloride 0.9 % 1,000 mL for intraperitoneal lavage   Intraperitoneal To OR Michael Boston, MD      . feeding supplement (ENSURE PRE-SURGERY) liquid 592 mL  592 mL Oral Once Michael Boston, MD       Followed by  . feeding supplement (ENSURE PRE-SURGERY) liquid 296 mL  296 mL Oral Once Michael Boston, MD      . feeding supplement (ENSURE SURGERY) liquid 237 mL  237 mL Oral BID BM Lurdes Haltiwanger,  Remo Lipps, MD      . fentaNYL (SUBLIMAZE) injection 25-50 mcg  25-50 mcg Intravenous Q5 min PRN Janeece Riggers, MD      . ketorolac (TORADOL) 30 MG/ML injection 30 mg  30 mg Intravenous Once PRN Janeece Riggers, MD      . lactated ringers infusion   Intravenous Continuous Suzette Battiest, MD 50 mL/hr at 10/10/17 [phone]    . meperidine (DEMEROL) injection 6.25-12.5 mg  6.25-12.5 mg Intravenous Q5 min PRN Janeece Riggers, MD      . ondansetron [facility]) injection 4 mg  4 mg Intravenous Once PRN Janeece Riggers, MD      . oxyCODONE (Oxy IR/ROXICODONE) immediate release tablet 5 mg  5 mg Oral Once PRN Janeece Riggers, MD       Or  . oxyCODONE (ROXICODONE) 5 MG/5ML solution 5 mg  5 mg Oral Once PRN Janeece Riggers, MD      . scopolamine (TRANSDERM-SCOP) 1 MG/3DAYS            Facility-Administered Medications Ordered in Other Encounters  Medication Dose Route Frequency Provider Last Rate Last Dose  . scopolamine (TRANSDERM-SCOP) 1 MG/3DAYS    Anesthesia Intra-op Talbot Grumbling, CRNA   1 patch at 10/10/17 0940     Allergies  Allergen Reactions  . Chloroquine Itching    BP 139/90   Pulse 70   Temp 97.7 F (36.5 C) (Oral)   Resp 18   Ht 6\' 1"  (1.854 m)   Wt 87.5 kg (193 lb)   SpO2 100%   BMI 25.46 kg/m   Labs: No results found for this or any previous visit (from the past 48 hour(s)).  Imaging / Studies: No results found.   [name], M.D., F.A.C.S. Gastrointestinal and Minimally Invasive Surgery [facility] [address], [place], [place] [address] [phone] Main / Paging  10/10/2017 10:24 AM    Edgar Frisk Lucarelli  has presented today for surgery, with the diagnosis of mid rectal cancer  The various methods of treatment have been discussed with the patient and family. After consideration of risks, benefits and other options for treatment, the patient has consented to  Procedure(s): XI ROBOTIC ASSISTED LOWER ANTERIOR RESECTION WITH POSSIBLE OSTOMY  ERAS PATHWAY (N/A) RIGID PROCTOSCOPY (N/A) as a surgical intervention .  The patient's history has been reviewed, patient examined, no change in status, stable for surgery.  I have reviewed the patient's chart and labs.  Questions were answered to the patient's satisfaction.     [name]

## 2017-10-11 ENCOUNTER — Encounter (HOSPITAL_COMMUNITY): Payer: Self-pay | Admitting: Surgery

## 2017-10-11 LAB — BASIC METABOLIC PANEL
ANION GAP: 4 — AB (ref 5–15)
BUN: 8 mg/dL (ref 6–20)
CO2: 27 mmol/L (ref 22–32)
Calcium: 8.7 mg/dL — ABNORMAL LOW (ref 8.9–10.3)
Chloride: 109 mmol/L (ref 101–111)
Creatinine, Ser: 0.8 mg/dL (ref 0.61–1.24)
GFR calc Af Amer: >60 mL/min (ref >60)
Glucose, Bld: 126 mg/dL — ABNORMAL HIGH (ref 65–99)
POTASSIUM: 3.8 mmol/L (ref 3.5–5.1)
SODIUM: 140 mmol/L (ref 135–145)

## 2017-10-11 LAB — CBC
HEMATOCRIT: 34.1 % — AB (ref 39.0–52.0)
HEMOGLOBIN: 11.5 g/dL — AB (ref 13.0–17.0)
MCH: 28.8 pg (ref 26.0–34.0)
MCHC: 33.7 g/dL (ref 30.0–36.0)
MCV: 85.5 fL (ref 78.0–100.0)
Platelets: 202 10*3/uL (ref 150–400)
RBC: 3.99 MIL/uL — ABNORMAL LOW (ref 4.22–5.81)
RDW: 15.2 % (ref 11.5–15.5)
WBC: 7.6 10*3/uL (ref 4.0–10.5)

## 2017-10-11 LAB — MAGNESIUM: MAGNESIUM: 1.7 mg/dL (ref 1.7–2.4)

## 2017-10-11 MED ORDER — TRAMADOL HCL 50 MG PO TABS: 50.0000 mg | ORAL_TABLET | Freq: Four times a day (QID) | ORAL | Status: DC | PRN

## 2017-10-11 NOTE — Progress Notes (Signed)
[facility]  [address] [place], [place] [address] Phone: [phone]  FAX: [phone]      Norwood Quezada [id number] [birth year]  CARE TEAM:  PCP: Eulas Post, MD  Outpatient Care Team: Patient Care Team: Eulas Post, MD as PCP - General (Family Medicine) Arta Silence, MD as Consulting Physician (Gastroenterology) Michael Boston, MD as Consulting Physician (General Surgery) Kyung Rudd, MD as Consulting Physician (Radiation Oncology) Truitt Merle, MD as Consulting Physician (Oncology)  Inpatient Treatment Team: Treatment Team: Attending Provider: Michael Boston, MD; Registered Nurse: Mortimer Fries, RN; Registered Nurse: Vicente Serene, RN; Technician: Sueanne Margarita, NT   Problem List:   Principal Problem:   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  Stop IV fluids.  Remove Foley catheter and postoperative day 2 per protocol.  GERD-proton pump inhibitor.  Hypertension - controlled.  Follow-up on pathology.  -VTE prophylaxis- SCDs, etc  -mobilize as tolerated to help recovery  25 minutes spent in review, evaluation, examination, counseling, and coordination of care.  More than 50% of that time was spent in counseling.  I updated the patient's status to the patient and nurse. ([facility] [place] 5W RNs)) Recommendations were made.  Questions were answered.  They expressed understanding & appreciation.   [name], M.D., F.A.C.S. Gastrointestinal and Minimally Invasive [facility] [address] [place], [place] [address] [phone] Main / Paging   10/11/2017    Subjective: (Chief complaint)  Denies pain.  Walking hallways.  Loose bowel movements last night.  No nausea or vomiting.  Tolerating liquids.  Objective:  Vital signs:  Vitals:   10/10/17 2132 10/11/17 0210 10/11/17 0500 10/11/17 0540  BP: 129/72 125/68  122/64  Pulse: 78 72  61  Resp: _0 Temp: 98.8 F (37.1 C) 99 F (37.2 C)  98.5 F (36.9 C)  TempSrc: Oral Oral  Oral  SpO2: 100% 100%  100%  Weight:   84 kg (185 lb 3 oz)   Height:        Last BM Date: 10/11/17  Intake/Output   Yesterday:  12/19 0701 - 12/20 0700 In: 4114.2 [P.O.:1560; I.V.:2554.2] Out: 0761 [Urine:5075; Stool:4; Blood:125] This shift:  No intake/output data recorded.  Bowel function:  Flatus: YES  BM:  YES  Drain: (No drain)   Physical Exam:  General: Pt awake/alert/oriented x4 in no acute distress Eyes: PERRL, normal EOM.  Sclera clear.  No icterus Neuro: CN II-XII intact w/o focal sensory/motor deficits. Lymph: No head/neck/groin lymphadenopathy Psych:  No delerium/psychosis/paranoia HENT: Normocephalic, Mucus membranes moist.  No thrush Neck: Supple, No tracheal deviation Chest: No chest wall pain w good excursion CV:  Pulses intact.  Regular rhythm MS: Normal AROM mjr joints.  No obvious deformity  Abdomen: Soft.  Nondistended.  Mildly tender at incisions only.  No evidence of peritonitis.  No incarcerated hernias.  Ext:  No deformity.  No mjr edema.  No cyanosis Skin: No petechiae / purpura  Results:   Labs: Results for orders placed or performed during  the hospital encounter of 10/10/17 (from the past 48 hour(s))  Basic metabolic panel     Status: Abnormal   Collection Time: 10/11/17  4:34 AM  Result Value Ref Range   Sodium 140 135 - 145 mmol/L   Potassium 3.8 3.5 - 5.1 mmol/L   Chloride 109 101 - 111 mmol/L   CO2 27 22 - 32  mmol/L   Glucose, Bld 126 (H) 65 - 99 mg/dL   BUN 8 6 - 20 mg/dL   Creatinine, Ser 0.80 0.61 - 1.24 mg/dL   Calcium 8.7 (L) 8.9 - 10.3 mg/dL   GFR calc non Af Amer >60 >60 mL/min   GFR calc Af Amer >60 >60 mL/min    Comment: (NOTE) The eGFR has been calculated using the CKD EPI equation. This calculation has not been validated in all clinical situations. eGFR's persistently <60 mL/min signify possible Chronic Kidney Disease.    Anion gap 4 (L) 5 - 15  CBC     Status: Abnormal   Collection Time: 10/11/17  4:34 AM  Result Value Ref Range   WBC 7.6 4.0 - 10.5 K/uL   RBC 3.99 (L) 4.22 - 5.81 MIL/uL   Hemoglobin 11.5 (L) 13.0 - 17.0 g/dL   HCT 34.1 (L) 39.0 - 52.0 %   MCV 85.5 78.0 - 100.0 fL   MCH 28.8 26.0 - 34.0 pg   MCHC 33.7 30.0 - 36.0 g/dL   RDW 15.2 11.5 - 15.5 %   Platelets 202 150 - 400 K/uL  Magnesium     Status: None   Collection Time: 10/11/17  4:34 AM  Result Value Ref Range   Magnesium 1.7 1.7 - 2.4 mg/dL    Imaging / Studies: No results found.  Medications / Allergies: per chart  Antibiotics: Anti-infectives (From admission, onward)   Start     Dose/Rate Route Frequency Ordered Stop   10/10/17 2200  cefoTEtan (CEFOTAN) 2 g in dextrose 5 % 50 mL IVPB     2 g 100 mL/hr over 30 Minutes Intravenous Every 12 hours 10/10/17 1553 10/11/17 0004   10/10/17 1302  clindamycin (CLEOCIN) 900 mg, gentamicin (GARAMYCIN) 240 mg in sodium chloride 0.9 % 1,000 mL for intraperitoneal lavage  Status:  Discontinued       As needed 10/10/17 1302 10/10/17 1336   10/10/17 0750  cefoTEtan in Dextrose 5% (CEFOTAN) IVPB 2 g     2 g Intravenous On call to O.R. 10/10/17 0750 10/10/17 1100   10/10/17 0750  neomycin (MYCIFRADIN) tablet 1,000 mg  Status:  Discontinued     1,000 mg Oral 3 times per day 10/10/17 0750 10/10/17 0755   10/10/17 0750  metroNIDAZOLE (FLAGYL) tablet 1,000 mg  Status:  Discontinued     1,000 mg Oral 3 times per day 10/10/17 0750 10/10/17 0755   10/10/17 0600   clindamycin (CLEOCIN) 900 mg, gentamicin (GARAMYCIN) 240 mg in sodium chloride 0.9 % 1,000 mL for intraperitoneal lavage  Status:  Discontinued      Intraperitoneal To Surgery 10/09/17 1224 10/10/17 1541        Note: Portions of this report may have been transcribed using voice recognition software. Every effort was made to ensure accuracy; however, inadvertent computerized transcription errors may be present.   Any transcriptional errors that result from this process are unintentional.     [name], M.D., F.A.C.S. Gastrointestinal and Minimally Invasive Surgery [facility] [address] [place], [place] [address] [phone] Main /  Paging   10/11/2017

## 2017-10-11 NOTE — Progress Notes (Signed)
Pharmacy Brief Note - Alvimopan (Entereg)  The standing order set for alvimopan (Entereg) now includes an automatic order to discontinue the drug after the patient has had a bowel movement. The change was approved by the [place] and the Medical Executive Committee.   This patient has had bowel movements documented by nursing. Therefore, alvimopan has been discontinued. If there are questions, please contact the pharmacy at [phone].   Thank you- Doreene Eland, PharmD, BCPS.   10/11/2017 11:49 AM

## 2017-10-12 NOTE — Discharge Summary (Signed)
Physician Discharge Summary  Patient ID: Paul [name] MRN: [id number] DOB/AGE: 02/09/1966  51 y.o.  Admit date: 10/10/2017 Discharge date: 10/12/2017   Patient Care Team: Eulas Post, MD as PCP - General (Family Medicine) Arta Silence, MD as Consulting Physician (Gastroenterology) Michael Boston, MD as Consulting Physician (General Surgery) Kyung Rudd, MD as Consulting Physician (Radiation Oncology) Truitt Merle, MD as Consulting Physician (Oncology)  Discharge Diagnoses:  Principal Problem:   Rectal adenocarcinoma s/p robotic LAR resection 10/10/2017 Active Problems:   GERD (gastroesophageal reflux disease)   Essential hypertension   Iron deficiency anemia due to chronic blood loss   2 Days Post-Op  10/10/2017  POST-OPERATIVE DIAGNOSIS:   mid rectal cancer  SURGERY:  10/10/2017  Procedure(s): XI ROBOTIC ASSISTED LOWER ANTERIOR RECTOSIGMOID RESECTION RIGID PROCTOSCOPY  SURGEON:  [name], MD, FACS   Consults: None  Hospital Course:   The patient underwent the surgery above.  Postoperatively, the patient gradually mobilized and advanced to a solid diet.  Pain and other symptoms were treated aggressively.    By the time of discharge, the patient was walking well the hallways, eating food, having flatus.  Pain was well-controlled on an oral medications.  Based on meeting discharge criteria and continuing to recover, I felt it was safe for the patient to be discharged from the hospital to further recover with close followup. Postoperative recommendations were discussed in detail.  They are written as well.  Discharged Condition: good  Disposition:  Follow-up Information    Michael Boston, MD. Schedule an appointment as soon as possible for a visit in 2 weeks.   Specialty:  General Surgery Why:  To follow up after your operation, To follow up after your hospital stay Contact information: [place]  [phone]           01-Home or Self Care  Discharge Instructions    Call MD for:   Complete by:  As directed    FEVER > 101.5 F  (temperatures < 101.5 F are not significant)   Call MD for:   Complete by:  As directed    FEVER > 101.5 F  (temperatures < 101.5 F are not significant)   Call MD for:  extreme fatigue   Complete by:  As directed    Call MD for:  extreme fatigue   Complete by:  As directed    Call MD for:  persistant dizziness or light-headedness   Complete by:  As directed    Call MD for:  persistant dizziness or light-headedness   Complete by:  As directed    Call MD for:  persistant nausea and vomiting   Complete by:  As directed    Call MD for:  persistant nausea and vomiting   Complete by:  As directed    Call MD for:  redness, tenderness, or signs of infection (pain, swelling, redness, odor or green/yellow discharge around incision site)   Complete by:  As directed    Call MD for:  redness, tenderness, or signs of infection (pain, swelling, redness, odor or green/yellow discharge around incision site)   Complete by:  As directed    Call MD for:  severe uncontrolled pain   Complete by:  As directed    Call MD for:  severe uncontrolled pain   Complete by:  As directed    Diet - low sodium heart healthy   Complete by:  As directed    Follow a light diet  the first few days at home.   Start with a bland diet such as soups, liquids, starchy foods, low fat foods, etc.   If you feel full, bloated, or constipated, stay on a full liquid or pureed/blenderized diet for a few days until you feel better and no longer constipated. Be sure to drink plenty of fluids every day to avoid getting dehydrated (feeling dizzy, not urinating, etc.). Gradually add a fiber supplement to your diet   Diet - low sodium heart healthy   Complete by:  As directed    Follow a light diet the first few days at home.   Start with a bland diet such as soups, liquids, starchy  foods, low fat foods, etc.   If you feel full, bloated, or constipated, stay on a full liquid or pureed/blenderized diet for a few days until you feel better and no longer constipated. Be sure to drink plenty of fluids every day to avoid getting dehydrated (feeling dizzy, not urinating, etc.). Gradually add a fiber supplement to your diet   Discharge instructions   Complete by:  As directed    See Discharge Instructions If you are not getting better after two weeks or are noticing you are getting worse, contact our office [phone] for further advice.  We may need to adjust your medications, re-evaluate you in the office, send you to the emergency room, or see what other things we can do to help. The clinic staff is available to answer your questions during regular business hours (8:30am-5pm).  Please don't hesitate to call and ask to speak to one of our nurses for clinical concerns.    A surgeon from [facility] is always on call at the hospitals 24 hours/day If you have a medical emergency, go to the nearest emergency room or call 911.   Discharge instructions   Complete by:  As directed    See Discharge Instructions If you are not getting better after two weeks or are noticing you are getting worse, contact our office [phone] for further advice.  We may need to adjust your medications, re-evaluate you in the office, send you to the emergency room, or see what other things we can do to help. The clinic staff is available to answer your questions during regular business hours (8:30am-5pm).  Please don't hesitate to call and ask to speak to one of our nurses for clinical concerns.    A surgeon from [facility] is always on call at the hospitals 24 hours/day If you have a medical emergency, go to the nearest emergency room or call 911.   Driving Restrictions   Complete by:  As directed    You may drive when you are no longer taking narcotic prescription pain  medication, you can comfortably wear a seatbelt, and you can safely make sudden turns/stops to protect yourself without hesitating due to pain.   Driving Restrictions   Complete by:  As directed    You may drive when you are no longer taking narcotic prescription pain medication, you can comfortably wear a seatbelt, and you can safely make sudden turns/stops to protect yourself without hesitating due to pain.   Increase activity slowly   Complete by:  As directed    Start light daily activities --- self-care, walking, climbing stairs- beginning the day after surgery.  Gradually increase activities as tolerated.  Control your pain to be active.  Stop when you are tired.  Ideally, walk several times a  day, eventually an hour a day.   Most people are back to most day-to-day activities in a few weeks.  It takes 4-8 weeks to get back to unrestricted, intense activity. If you can walk 30 minutes without difficulty, it is safe to try more intense activity such as jogging, treadmill, bicycling, low-impact aerobics, swimming, etc. Save the most intensive and strenuous activity for last (Usually 4-8 weeks after surgery) such as sit-ups, heavy lifting, contact sports, etc.  Refrain from any intense heavy lifting or straining until you are off narcotics for pain control.  You will have off days, but things should improve week-by-week. DO NOT PUSH THROUGH PAIN.  Let pain be your guide: If it hurts to do something, don't do it.  Pain is your body warning you to avoid that activity for another week until the pain goes down.   Increase activity slowly   Complete by:  As directed    Start light daily activities --- self-care, walking, climbing stairs- beginning the day after surgery.  Gradually increase activities as tolerated.  Control your pain to be active.  Stop when you are tired.  Ideally, walk several times a day, eventually an hour a day.   Most people are back to most day-to-day activities in a few weeks.  It  takes 4-8 weeks to get back to unrestricted, intense activity. If you can walk 30 minutes without difficulty, it is safe to try more intense activity such as jogging, treadmill, bicycling, low-impact aerobics, swimming, etc. Save the most intensive and strenuous activity for last (Usually 4-8 weeks after surgery) such as sit-ups, heavy lifting, contact sports, etc.  Refrain from any intense heavy lifting or straining until you are off narcotics for pain control.  You will have off days, but things should improve week-by-week. DO NOT PUSH THROUGH PAIN.  Let pain be your guide: If it hurts to do something, don't do it.  Pain is your body warning you to avoid that activity for another week until the pain goes down.   Lifting restrictions   Complete by:  As directed    If you can walk 30 minutes without difficulty, it is safe to try more intense activity such as jogging, treadmill, bicycling, low-impact aerobics, swimming, etc. Save the most intensive and strenuous activity for last (Usually 4-8 weeks after surgery) such as sit-ups, heavy lifting, contact sports, etc.  Refrain from any intense heavy lifting or straining until you are off narcotics for pain control.  You will have off days, but things should improve week-by-week. DO NOT PUSH THROUGH PAIN.  Let pain be your guide: If it hurts to do something, don't do it.  Pain is your body warning you to avoid that activity for another week until the pain goes down.   Lifting restrictions   Complete by:  As directed    If you can walk 30 minutes without difficulty, it is safe to try more intense activity such as jogging, treadmill, bicycling, low-impact aerobics, swimming, etc. Save the most intensive and strenuous activity for last (Usually 4-8 weeks after surgery) such as sit-ups, heavy lifting, contact sports, etc.  Refrain from any intense heavy lifting or straining until you are off narcotics for pain control.  You will have off days, but things should  improve week-by-week. DO NOT PUSH THROUGH PAIN.  Let pain be your guide: If it hurts to do something, don't do it.  Pain is your body warning you to avoid that activity for another week until  the pain goes down.   May walk up steps   Complete by:  As directed    May walk up steps   Complete by:  As directed    No wound care   Complete by:  As directed    It is good for closed incision and even open wounds to be washed every day.  Shower every day.  Short baths are fine.  Wash the incisions and wounds clean with soap & water.    If you have a closed incision(s), wash the incision with soap & water every day.  You may leave closed incisions open to air if it is dry.   You may cover the incision with clean gauze & replace it after your daily shower for comfort. If you have skin tapes (Steristrips) or skin glue (Dermabond) on your incision, leave them in place.  They will fall off on their own like a scab.  You may trim any edges that curl up with clean scissors.  If you have staples, set up an appointment for them to be removed in the office in 10 days after surgery.  If you have a drain, wash around the skin exit site with soap & water and place a new dressing of gauze or band aid around the skin every day.  Keep the drain site clean & dry.   No wound care   Complete by:  As directed    It is good for closed incision and even open wounds to be washed every day.  Shower every day.  Short baths are fine.  Wash the incisions and wounds clean with soap & water.    If you have a closed incision(s), wash the incision with soap & water every day.  You may leave closed incisions open to air if it is dry.   You may cover the incision with clean gauze & replace it after your daily shower for comfort. If you have skin tapes (Steristrips) or skin glue (Dermabond) on your incision, leave them in place.  They will fall off on their own like a scab.  You may trim any edges that curl up with clean scissors.  If you  have staples, set up an appointment for them to be removed in the office in 10 days after surgery.  If you have a drain, wash around the skin exit site with soap & water and place a new dressing of gauze or band aid around the skin every day.  Keep the drain site clean & dry.   Sexual Activity Restrictions   Complete by:  As directed    You may have sexual intercourse when it is comfortable. If it hurts to do something, stop.   Sexual Activity Restrictions   Complete by:  As directed    You may have sexual intercourse when it is comfortable. If it hurts to do something, stop.      Allergies as of 10/12/2017      Reactions   Chloroquine Itching      Medication List    STOP taking these medications   capecitabine 500 MG tablet Commonly known as:  XELODA   hydrocortisone 25 MG suppository Commonly known as:  ANUSOL-HC   oxyCODONE 5 MG immediate release tablet Commonly known as:  Oxy IR/ROXICODONE   psyllium 58.6 % powder Commonly known as:  METAMUCIL     TAKE these medications   amLODipine 5 MG tablet Commonly known as:  NORVASC TAKE 1 TABLET DAILY  calcium carbonate 1250 (500 Ca) MG tablet Commonly known as:  OS-CAL - dosed in mg of elemental calcium Take 1 tablet by mouth daily as needed (for bones).   CALCIUM CITRATE+D3 PETITES PO Take 1 tablet by mouth daily.   cetirizine 10 MG tablet Commonly known as:  ZYRTEC Take 10 mg by mouth daily as needed for allergies.   GARLIC PO Take 1 capsule by mouth daily.   multivitamin with minerals Tabs tablet Take 1 tablet by mouth daily.   naproxen 500 MG tablet Commonly known as:  NAPROSYN Take 1 tablet (500 mg total) by mouth every 12 (twelve) hours as needed for mild pain or moderate pain.   omeprazole 40 MG capsule Commonly known as:  PRILOSEC Take 40 mg by mouth daily as needed (heart burn).   ondansetron 8 MG tablet Commonly known as:  ZOFRAN Take 1 tablet (8 mg total) by mouth every 8 (eight) hours as needed  for nausea or vomiting.   prochlorperazine 10 MG tablet Commonly known as:  COMPAZINE Take 1 tablet (10 mg total) by mouth every 6 (six) hours as needed for nausea or vomiting.   traMADol 50 MG tablet Commonly known as:  ULTRAM Take 1-2 tablets (50-100 mg total) by mouth every 6 (six) hours as needed for moderate pain or severe pain.       Significant Diagnostic Studies:  Results for orders placed or performed during the hospital encounter of 10/10/17 (from the past 72 hour(s))  Basic metabolic panel     Status: Abnormal   Collection Time: 10/11/17  4:34 AM  Result Value Ref Range   Sodium 140 135 - 145 mmol/L   Potassium 3.8 3.5 - 5.1 mmol/L   Chloride 109 101 - 111 mmol/L   CO2 27 22 - 32 mmol/L   Glucose, Bld 126 (H) 65 - 99 mg/dL   BUN 8 6 - 20 mg/dL   Creatinine, Ser 0.80 0.61 - 1.24 mg/dL   Calcium 8.7 (L) 8.9 - 10.3 mg/dL   GFR calc non Af Amer >60 >60 mL/min   GFR calc Af Amer >60 >60 mL/min    Comment: (NOTE) The eGFR has been calculated using the CKD EPI equation. This calculation has not been validated in all clinical situations. eGFR's persistently <60 mL/min signify possible Chronic Kidney Disease.    Anion gap 4 (L) 5 - 15  CBC     Status: Abnormal   Collection Time: 10/11/17  4:34 AM  Result Value Ref Range   WBC 7.6 4.0 - 10.5 K/uL   RBC 3.99 (L) 4.22 - 5.81 MIL/uL   Hemoglobin 11.5 (L) 13.0 - 17.0 g/dL   HCT 34.1 (L) 39.0 - 52.0 %   MCV 85.5 78.0 - 100.0 fL   MCH 28.8 26.0 - 34.0 pg   MCHC 33.7 30.0 - 36.0 g/dL   RDW 15.2 11.5 - 15.5 %   Platelets 202 150 - 400 K/uL  Magnesium     Status: None   Collection Time: 10/11/17  4:34 AM  Result Value Ref Range   Magnesium 1.7 1.7 - 2.4 mg/dL    No results found.  Discharge Exam: Blood pressure 115/69, pulse 73, temperature 98.8 F (37.1 C), temperature source Oral, resp. rate 16, height '6\' 1"'  (1.854 m), weight 85.4 kg (188 lb 4.4 oz), SpO2 95 %.  General: Pt awake/alert/oriented x4 in No acute  distress Eyes: PERRL, normal EOM.  Sclera clear.  No icterus Neuro: CN II-XII intact w/o focal sensory/motor  deficits. Lymph: No head/neck/groin lymphadenopathy Psych:  No delerium/psychosis/paranoia HENT: Normocephalic, Mucus membranes moist.  No thrush Neck: Supple, No tracheal deviation Chest: No chest wall pain w good excursion CV:  Pulses intact.  Regular rhythm MS: Normal AROM mjr joints.  No obvious deformity Abdomen: Soft.  Nondistended.  Nontender.  Dressings & wicks removed - clean.  No evidence of peritonitis.  No incarcerated hernias. Ext:  SCDs BLE.  No mjr edema.  No cyanosis Skin: No petechiae / purpura  Past Medical History:  Diagnosis Date  . Allergy    grass, dander  . Essential hypertension 07/03/2017  . Family history of prostate cancer   . GERD (gastroesophageal reflux disease)   . History of meningitis   . Hypertension   . 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 COLONOSCOPY WITH PROPOFOL Left 07/05/2017   Procedure: COLONOSCOPY WITH PROPOFOL;  Surgeon: Arta Silence, MD;  Location: [place];  Service: Endoscopy;  Laterality: Left;  . PROCTOSCOPY N/A 10/10/2017   Procedure: RIGID PROCTOSCOPY;  Surgeon: Michael Boston, MD;  Location: [facility];  Service: General;  Laterality: N/A;  . XI ROBOTIC ASSISTED LOWER ANTERIOR RESECTION N/A 10/10/2017   Procedure: XI ROBOTIC ASSISTED LOWER ANTERIOR RESECTION;  Surgeon: Michael Boston, MD;  Location: [facility];  Service: General;  Laterality: N/A;    Social History   Socioeconomic History  . Marital status: Married    Spouse name: Not on file  . Number of children: Not on file  . Years of education: Not on file  . Highest education level: Not on file  Social Needs  . Financial resource strain: Not on file  . Food insecurity - worry: Not on file  . Food insecurity -  inability: Not on file  . Transportation needs - medical: Not on file  . Transportation needs - non-medical: Not on file  Occupational History  . Not on file  Tobacco Use  . Smoking status: Never Smoker  . Smokeless tobacco: Never Used  Substance and Sexual Activity  . Alcohol use: No  . Drug use: No  . Sexual activity: Not on file  Other Topics Concern  . Not on file  Social History Narrative   Pronouced "[employer]"   Originally from [place], [place], [place]    Family History  Problem Relation Age of Onset  . Hypertension Mother   . Asthma Father   . Cancer Father        Prostate  . Asthma Son   . Asthma Son   . Asthma Daughter   . Dementia Maternal Grandmother     Current Facility-Administered Medications  Medication Dose Route Frequency Provider Last Rate Last Dose  . acetaminophen (TYLENOL) tablet 1,000 mg  1,000 mg Oral TID Michael Boston, MD   1,000 mg at 10/11/17 2214  . alum & mag hydroxide-simeth (MAALOX/MYLANTA) 200-200-20 MG/5ML suspension 30 mL  30 mL Oral Q6H PRN Michael Boston, MD      . diphenhydrAMINE (BENADRYL) 12.5 MG/5ML elixir 12.5 mg  12.5 mg Oral Q6H PRN Michael Boston, MD       Or  . diphenhydrAMINE (BENADRYL) injection 12.5 mg  12.5 mg Intravenous Q6H PRN Michael Boston, MD      . enoxaparin (LOVENOX) injection 40 mg  40 mg Subcutaneous Q24H Michael Boston, MD   40 mg at 10/11/17 0809  . gabapentin (  NEURONTIN) capsule 300 mg  300 mg Oral BID Michael Boston, MD   300 mg at 10/11/17 2214  . guaiFENesin-dextromethorphan (ROBITUSSIN DM) 100-10 MG/5ML syrup 10 mL  10 mL Oral Q4H PRN Michael Boston, MD      . hydrocortisone (ANUSOL-HC) 2.5 % rectal cream 1 application  1 application Topical QID PRN Michael Boston, MD      . hydrocortisone cream 1 % 1 application  1 application Topical TID PRN Michael Boston, MD      . HYDROmorphone (DILAUDID) injection 0.5-2 mg  0.5-2 mg Intravenous Q2H PRN Michael Boston, MD      . lactated ringers  bolus 1,000 mL  1,000 mL Intravenous Q8H PRN Amana Bouska, Remo Lipps, MD      . lactated ringers infusion 1,000 mL  1,000 mL Intravenous Q8H PRN Channelle Bottger, Remo Lipps, MD      . lip balm (CARMEX) ointment 1 application  1 application Topical BID Michael Boston, MD   1 application at 38/38/18 2214  . loratadine (CLARITIN) tablet 10 mg  10 mg Oral Daily Michael Boston, MD   10 mg at 10/11/17 1041  . magic mouthwash  15 mL Oral QID PRN Michael Boston, MD      . menthol-cetylpyridinium (CEPACOL) lozenge 3 mg  1 lozenge Oral PRN Michael Boston, MD      . methocarbamol (ROBAXIN) 1,000 mg in dextrose 5 % 50 mL IVPB  1,000 mg Intravenous Q6H PRN Michael Boston, MD      . methocarbamol (ROBAXIN) tablet 1,000 mg  1,000 mg Oral Q6H PRN Michael Boston, MD   1,000 mg at 10/10/17 2333  . metoprolol tartrate (LOPRESSOR) injection 5 mg  5 mg Intravenous Q6H PRN Michael Boston, MD      . ondansetron [facility]) tablet 4 mg  4 mg Oral Q6H PRN Michael Boston, MD       Or  . ondansetron (ZOFRAN) injection 4 mg  4 mg Intravenous Q6H PRN Michael Boston, MD      . pantoprazole (PROTONIX) EC tablet 40 mg  40 mg Oral Q1200 Michael Boston, MD   40 mg at 10/11/17 1256  . phenol (CHLORASEPTIC) mouth spray 1-2 spray  1-2 spray Mouth/Throat PRN Michael Boston, MD      . saccharomyces boulardii (FLORASTOR) capsule 250 mg  250 mg Oral BID Michael Boston, MD   250 mg at 10/11/17 2214  . traMADol (ULTRAM) tablet 50-100 mg  50-100 mg Oral Q6H PRN Michael Boston, MD      . zolpidem (AMBIEN) tablet 5 mg  5 mg Oral QHS PRN,MR X 1 Michael Boston, MD         Allergies  Allergen Reactions  . Chloroquine Itching    Signed: Morton Peters, M.D., F.A.C.S. Gastrointestinal and Minimally Invasive Surgery [facility] [address], [place], [place] [id number] [phone] Main / Paging   10/12/2017, 7:32 AM

## 2017-10-12 NOTE — Progress Notes (Signed)
Patient voided QS clear yellow urine. Donne Hazel, RN

## 2017-10-12 NOTE — Progress Notes (Signed)
Discharge and medication instructions reviewed with patient. Questions answered and patient denies further questions. Patient given one prescription. Patient due to void, then may be DC'd. Friend is coming to drive patient home. Donne Hazel, RN

## 2017-10-19 ENCOUNTER — Encounter: Payer: Self-pay | Admitting: Family Medicine

## 2017-10-19 ENCOUNTER — Ambulatory Visit (INDEPENDENT_AMBULATORY_CARE_PROVIDER_SITE_OTHER): Payer: BLUE CROSS/BLUE SHIELD | Admitting: Family Medicine

## 2017-10-19 VITALS — BP 118/82 | Temp 98.3°F | Ht 72.75 in | Wt 188.1 lb

## 2017-10-19 DIAGNOSIS — Z1331 Encounter for screening for depression: Secondary | ICD-10-CM | POA: Diagnosis not present

## 2017-10-19 DIAGNOSIS — Z0000 Encounter for general adult medical examination without abnormal findings: Secondary | ICD-10-CM

## 2017-10-19 DIAGNOSIS — R197 Diarrhea, unspecified: Secondary | ICD-10-CM

## 2017-10-19 DIAGNOSIS — Z0001 Encounter for general adult medical examination with abnormal findings: Secondary | ICD-10-CM | POA: Diagnosis not present

## 2017-10-19 LAB — CBC WITH DIFFERENTIAL/PLATELET
BASOS ABS: 0 10*3/uL (ref 0.0–0.1)
Basophils Relative: 0.6 % (ref 0.0–3.0)
EOS ABS: 0.1 10*3/uL (ref 0.0–0.7)
Eosinophils Relative: 4.8 % (ref 0.0–5.0)
HEMATOCRIT: 39.1 % (ref 39.0–52.0)
HEMOGLOBIN: 12.9 g/dL — AB (ref 13.0–17.0)
LYMPHS PCT: 14.8 % (ref 12.0–46.0)
Lymphs Abs: 0.5 10*3/uL — ABNORMAL LOW (ref 0.7–4.0)
MCHC: 33 g/dL (ref 30.0–36.0)
MCV: 86.9 fl (ref 78.0–100.0)
MONO ABS: 0.3 10*3/uL (ref 0.1–1.0)
Monocytes Relative: 10.7 % (ref 3.0–12.0)
Neutro Abs: 2.1 10*3/uL (ref 1.4–7.7)
Neutrophils Relative %: 69.1 % (ref 43.0–77.0)
Platelets: 195 10*3/uL (ref 150.0–400.0)
RBC: 4.51 Mil/uL (ref 4.22–5.81)
RDW: 16.1 % — AB (ref 11.5–15.5)
WBC: 3.1 10*3/uL — AB (ref 4.0–10.5)

## 2017-10-19 LAB — BASIC METABOLIC PANEL
BUN: 10 mg/dL (ref 6–23)
CHLORIDE: 104 meq/L (ref 96–112)
CO2: 31 mEq/L (ref 19–32)
CREATININE: 0.85 mg/dL (ref 0.40–1.50)
Calcium: 9.2 mg/dL (ref 8.4–10.5)
GFR: 122.84 mL/min (ref >60.00)
GLUCOSE: 101 mg/dL — AB (ref 70–99)
POTASSIUM: 4.2 meq/L (ref 3.5–5.1)
Sodium: 142 mEq/L (ref 135–145)

## 2017-10-19 LAB — PSA: PSA: 2.53 ng/mL (ref 0.10–4.00)

## 2017-10-19 LAB — HEPATIC FUNCTION PANEL
ALT: 23 U/L (ref 0–53)
AST: 20 U/L (ref 0–37)
Albumin: 4.5 g/dL (ref 3.5–5.2)
Alkaline Phosphatase: 62 U/L (ref 39–117)
BILIRUBIN DIRECT: 0.1 mg/dL (ref 0.0–0.3)
BILIRUBIN TOTAL: 0.7 mg/dL (ref 0.2–1.2)
Total Protein: 7.3 g/dL (ref 6.0–8.3)

## 2017-10-19 LAB — LIPID PANEL
CHOL/HDL RATIO: 2
CHOLESTEROL: 106 mg/dL (ref 0–200)
HDL: 49.9 mg/dL (ref >39.00)
LDL CALC: 41 mg/dL (ref 0–99)
NONHDL: 56.05
Triglycerides: 75 mg/dL (ref 0.0–149.0)
VLDL: 15 mg/dL (ref 0.0–40.0)

## 2017-10-19 LAB — TSH: TSH: 1.57 u[IU]/mL (ref 0.35–4.50)

## 2017-10-19 NOTE — Progress Notes (Signed)
Subjective:     Patient ID: Paul [name], male   DOB: April 12, 1966, 51 y.o.   MRN: [id number]  HPI   Patient seen for physical exam. He was diagnosed this past year with colon cancer and just had partial colon resection 9 days ago. He is recovering well. His only issue is some loose stools he states up to 8 per day. Has had some frequency of loose stools since hospital discharge.  No bloody stools. No fever. No abdominal pain. He received preoperative antibiotics otherwise no antibiotics. No history of C. Difficile.  Appetite has been good. Generally feels well. Fortunately, his lymph nodes were all negative and surgical margins were clear  Tetanus up-to-date. Had flu vaccine already. Family history reviewed. Father had prostate cancer. Patient has never smoked.  Past Medical History:  Diagnosis Date  . Allergy    grass, dander  . Essential hypertension 07/03/2017  . Family history of prostate cancer   . GERD (gastroesophageal reflux disease)   . History of meningitis   . Hypertension   . 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 COLONOSCOPY WITH PROPOFOL Left 07/05/2017   Procedure: COLONOSCOPY WITH PROPOFOL;  Surgeon: Arta Silence, MD;  Location: [facility];  Service: Endoscopy;  Laterality: Left;  . PROCTOSCOPY N/A 10/10/2017   Procedure: RIGID PROCTOSCOPY;  Surgeon: Michael Boston, MD;  Location: [facility];  Service: General;  Laterality: N/A;  . XI ROBOTIC ASSISTED LOWER ANTERIOR RESECTION N/A 10/10/2017   Procedure: XI ROBOTIC ASSISTED LOWER ANTERIOR RESECTION;  Surgeon: Michael Boston, MD;  Location: [facility];  Service: General;  Laterality: N/A;    reports that  has never smoked. he has never used smokeless tobacco. He reports that he does not drink alcohol or use drugs. family history includes Asthma in his daughter, father, son, and son;  Cancer in his father; Dementia in his maternal grandmother; Hypertension in his mother. Allergies  Allergen Reactions  . Chloroquine Itching     Review of Systems  Constitutional: Negative for activity change, appetite change, fatigue and fever.  HENT: Negative for congestion, ear pain and trouble swallowing.   Eyes: Negative for pain and visual disturbance.  Respiratory: Negative for cough, shortness of breath and wheezing.   Cardiovascular: Negative for chest pain and palpitations.  Gastrointestinal: Positive for diarrhea. Negative for abdominal distention, abdominal pain, anal bleeding, blood in stool, constipation, nausea, rectal pain and vomiting.  Genitourinary: Negative for dysuria, hematuria and testicular pain.  Musculoskeletal: Negative for arthralgias and joint swelling.  Skin: Negative for rash.  Neurological: Negative for dizziness, syncope and headaches.  Hematological: Negative for adenopathy.  Psychiatric/Behavioral: Negative for confusion and dysphoric mood.       Objective:   Physical Exam  Constitutional: He is oriented to person, place, and time. He appears well-developed and well-nourished. No distress.  HENT:  Head: Normocephalic and atraumatic.  Right Ear: External ear normal.  Left Ear: External ear normal.  Mouth/Throat: Oropharynx is clear and moist.  Eyes: Conjunctivae and EOM are normal. Pupils are equal, round, and reactive to light.  Neck: Normal range of motion. Neck supple. No thyromegaly present.  Cardiovascular: Normal rate, regular rhythm and normal heart sounds.  No murmur heard. Pulmonary/Chest: No respiratory distress. He has no wheezes. He has no rales.  Abdominal: Soft. Bowel sounds are normal. He exhibits no distension and no mass. There is no tenderness. There is no  rebound and no guarding.  Surgical wounds appear to be healing well. He has no abdominal tenderness  Musculoskeletal: He exhibits no edema.  Lymphadenopathy:    He has no  cervical adenopathy.  Neurological: He is alert and oriented to person, place, and time. He displays normal reflexes. No cranial nerve deficit.  Skin: No rash noted.  Psychiatric: He has a normal mood and affect.       Assessment:     Physical exam. Adenocarcinoma of the rectum status post recent robotic resection. Patient doing well with exception of frequent loose stools    Plan:     -Will check C. difficile PCR given recent hospitalization. Only antibiotics he received were preoperative -Pain screening labs. Patient requesting PSA with positive family history -Continue with yearly flu vaccine -Continue close follow-up with oncology and he has follow-up there January 3  Eulas Post MD [facility] [address] [facility]

## 2017-10-19 NOTE — Patient Instructions (Signed)
Follow up for any fever, bloody stools, or abdominal cramping.

## 2017-10-20 LAB — C. DIFFICILE GDH AND TOXIN A/B
GDH ANTIGEN: NOT DETECTED
MICRO NUMBER:: 81457458
SPECIMEN QUALITY:: ADEQUATE
TOXIN A AND B: NOT DETECTED

## 2017-10-24 ENCOUNTER — Telehealth: Payer: Self-pay | Admitting: Family Medicine

## 2017-10-24 NOTE — Telephone Encounter (Signed)
Pt given results and recommendations per notes of Dr. Elease Hashimoto on 12/29. Pt verbalized understanding. Lab appt scheduled in May. Unable to document in result note due to result not being routed to [facility].

## 2017-10-25 ENCOUNTER — Telehealth: Payer: Self-pay | Admitting: Hematology

## 2017-10-25 ENCOUNTER — Inpatient Hospital Stay: Payer: BLUE CROSS/BLUE SHIELD | Attending: Hematology | Admitting: Hematology

## 2017-10-25 ENCOUNTER — Other Ambulatory Visit (HOSPITAL_BASED_OUTPATIENT_CLINIC_OR_DEPARTMENT_OTHER): Payer: BLUE CROSS/BLUE SHIELD

## 2017-10-25 ENCOUNTER — Encounter: Payer: Self-pay | Admitting: Hematology

## 2017-10-25 VITALS — BP 139/82 | HR 71 | Temp 98.2°F | Resp 17 | Ht 72.75 in | Wt 190.7 lb

## 2017-10-25 DIAGNOSIS — D500 Iron deficiency anemia secondary to blood loss (chronic): Secondary | ICD-10-CM

## 2017-10-25 DIAGNOSIS — C20 Malignant neoplasm of rectum: Secondary | ICD-10-CM | POA: Diagnosis not present

## 2017-10-25 DIAGNOSIS — G8929 Other chronic pain: Secondary | ICD-10-CM | POA: Insufficient documentation

## 2017-10-25 DIAGNOSIS — Z79899 Other long term (current) drug therapy: Secondary | ICD-10-CM | POA: Insufficient documentation

## 2017-10-25 DIAGNOSIS — R197 Diarrhea, unspecified: Secondary | ICD-10-CM | POA: Diagnosis not present

## 2017-10-25 DIAGNOSIS — D649 Anemia, unspecified: Secondary | ICD-10-CM | POA: Insufficient documentation

## 2017-10-25 DIAGNOSIS — R110 Nausea: Secondary | ICD-10-CM

## 2017-10-25 LAB — CBC WITH DIFFERENTIAL/PLATELET
BASO%: 0.3 % (ref 0.0–2.0)
BASOS ABS: 0 10*3/uL (ref 0.0–0.1)
EOS%: 3.8 % (ref 0.0–7.0)
Eosinophils Absolute: 0.1 10*3/uL (ref 0.0–0.5)
HEMATOCRIT: 38 % — AB (ref 38.4–49.9)
HEMOGLOBIN: 12.7 g/dL — AB (ref 13.0–17.1)
LYMPH#: 0.6 10*3/uL — AB (ref 0.9–3.3)
LYMPH%: 16.6 % (ref 14.0–49.0)
MCH: 28.7 pg (ref 27.2–33.4)
MCHC: 33.4 g/dL (ref 32.0–36.0)
MCV: 85.8 fL (ref 79.3–98.0)
MONO#: 0.2 10*3/uL (ref 0.1–0.9)
MONO%: 6.7 % (ref 0.0–14.0)
NEUT#: 2.5 10*3/uL (ref 1.5–6.5)
NEUT%: 72.6 % (ref 39.0–75.0)
Platelets: 200 10*3/uL (ref 140–400)
RBC: 4.43 10*6/uL (ref 4.20–5.82)
RDW: 14.7 % — AB (ref 11.0–14.6)
WBC: 3.4 10*3/uL — ABNORMAL LOW (ref 4.0–10.3)

## 2017-10-25 LAB — COMPREHENSIVE METABOLIC PANEL
ALBUMIN: 3.9 g/dL (ref 3.5–5.0)
ALK PHOS: 80 U/L (ref 40–150)
ALT: 43 U/L (ref 0–55)
AST: 38 U/L — AB (ref 5–34)
Anion Gap: 7 mEq/L (ref 3–11)
BUN: 11.3 mg/dL (ref 7.0–26.0)
CALCIUM: 9.6 mg/dL (ref 8.4–10.4)
CO2: 31 mEq/L — ABNORMAL HIGH (ref 22–29)
CREATININE: 0.9 mg/dL (ref 0.7–1.3)
Chloride: 104 mEq/L (ref 98–109)
EGFR: >60 mL/min/{1.73_m2} (ref >60)
Glucose: 88 mg/dl (ref 70–140)
POTASSIUM: 4.4 meq/L (ref 3.5–5.1)
Sodium: 142 mEq/L (ref 136–145)
Total Bilirubin: 0.46 mg/dL (ref 0.20–1.20)
Total Protein: 7.3 g/dL (ref 6.4–8.3)

## 2017-10-25 LAB — CEA (IN HOUSE-CHCC): CEA (CHCC-In House): 2.41 ng/mL (ref 0.00–5.00)

## 2017-10-25 LAB — FERRITIN: FERRITIN: 19 ng/mL — AB (ref 22–316)

## 2017-10-25 LAB — IRON AND TIBC
%SAT: 13 % — ABNORMAL LOW (ref 20–55)
Iron: 50 ug/dL (ref 42–163)
TIBC: 376 ug/dL (ref 202–409)
UIBC: 326 ug/dL (ref 117–376)

## 2017-10-25 MED ORDER — CAPECITABINE 500 MG PO TABS: 1000.0000 mg/m2 | ORAL_TABLET | Freq: Two times a day (BID) | ORAL | 3 refills | Status: DC

## 2017-10-25 NOTE — Telephone Encounter (Signed)
Scheduled appt per 1/3 los - Gave patient AVS and calender per los.  

## 2017-10-25 NOTE — Progress Notes (Signed)
Paul [name]  [phone] Fax:([phone]) [phone]  Clinic Follow Up Note   Patient Care Team: Paul Post, MD as PCP - General (Family Medicine) Paul Silence, MD as Consulting Physician (Gastroenterology) Paul Boston, MD as Consulting Physician (General Surgery) Paul Rudd, MD as Consulting Physician (Radiation Oncology) Paul Merle, MD as Consulting Physician (Oncology)   Date of Service:  10/25/2017   CHIEF COMPLAINTS:  F/u rectal cancer  Oncology History   Cancer Staging Rectal adenocarcinoma [facility]) Staging form: Colon and Rectum, AJCC 8th Edition - Clinical stage from 07/06/2017: Stage IIIB (cT3, cN1, cM0) - Signed by Paul Feeling, NP on 07/10/2017      Rectal adenocarcinoma s/p robotic LAR resection 10/10/2017   07/03/2017 Imaging    CT ABD PELVIS W CONTRAST IMPRESSION: Possible mass in the rectum compatible carcinoma. Endoscopy recommended for further evaluation. Otherwise negative.      07/03/2017 Tumor Marker    CEA 2.7      07/05/2017 Initial Biopsy    Rectum biopsy -invasive adenocarcinoma, poorly differentiated      07/05/2017 Procedure    Colonoscopy Per Dr. Paulita [name] Findings: The perianal and digital rectal examinations were normal.  Internal hemorrhoids were found during retroflexion. The hemorrhoids were moderate. No additional abnormalities were found on retroflexion.  A fungating, sessile and ulcerated partially obstructing large mass was found in the recto-sigmoid colon.The mass was partially circumferential (involving two-thirds of the lumen circumference). Oozing was present. Area was successfully injected with 2 mL [place] ink for tattooing. This was biopsied with a cold forceps for histology.      07/05/2017 Initial Diagnosis    Rectal adenocarcinoma ([place])      07/06/2017 Imaging    Staging MRI ABD/PELVIS revealed clinical stage T3N1M0, IIIb       07/18/2017 - 08/24/2017 Radiation Therapy    Paul [name] with Dr  Paul [name]     Radiation treatment dates:   07/18/2017 - 08/24/2017  Site/dose:   The rectum was treated to 45 Gy in 25 fractions of 1.8 Gy, followed by a 5.4 Gy boost in 3 fractions to yield a total dose of 50.4 Gy.  Narrative: The patient tolerated radiation treatment relatively well.   He had rectal bleeding with clots that was bright red after bowel movements. He reported loose stools that were painful. The skin to the radiation site was irritated, but no skin breakage was noted.       07/18/2017 Imaging    CT CHEST  IMPRESSION: Negative. No evidence of thoracic metastatic disease or other significant abnormality.       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  The Hereditary Gene Panel offered by [facility] includes sequencing and/or deletion duplication testing of the following 47 genes: APC, ATM, AXIN2, BARD1, BMPR1A, BRCA1, BRCA2, BRIP1, CDH1, CDK4, CDKN2A (p14ARF), CDKN2A (p16INK4a), CHEK2, CTNNA1, DICER1, EPCAM (Deletion/duplication testing only), GREM1 (promoter region deletion/duplication testing only), KIT, MEN1, MLH1, MSH2, MSH3, MSH6, MUTYH, NBN, NF1, NHTL1, PALB2, PDGFRA, PMS2, POLD1, POLE, PTEN, RAD50, RAD51C, RAD51D, SDHB, SDHC, SDHD, SMAD4, SMARCA4. STK11, TP53, TSC1, TSC2, and VHL.  The following genes were evaluated for sequence changes only: SDHA and HOXB13 c.251G>A variant only.  The report date is August 27, 2017.       10/10/2017 Surgery    XI ROBOTIC ASSISTED  LOWER ANTERIOR RESECTION AND RIGID PROCTOSCOPY bu Dr. Johney [name] and Dr. Dema [name]  10/10/17       10/10/2017 Pathology Results        Diagnosis 10/10/17  1. Colon, segmental resection for tumor, sigmoid SMALL FOCUS OF COLONIC GLANDS WITH HIGH GRADE DYSPLASIA [name] NEOADJUVANT CHEMORADIATION THERAPY BIOPSY SITE WITH  CHRONIC INFLAMMATION NO RESIDULE INVASIVE CARCINOMA PRESENT TWENTY-TWO BENIGN LYMPH NODES (0/22) 2. Colon, resection margin (donut), final distal BENIGN COLONIC TISSUE      HISTORY OF PRESENTING ILLNESS:  Paul [name] 52 y.o. male is here because of newly diagnosed rectal cancer. He has a long standing history of loose stool and developed diarrhea within the last 4 weeks. He noticed 3 episodes in 1 day of blood on toilet paper which prompted him to go to the ER on 07/03/2017. A CT ABD and pelvis revealed a mass in the rectum compatible with carcinoma. Incidentally, he tested positive for shiga like toxin producing E coli while in the ED. He was discharged from ED with no new medication. Colonoscopy on 07/05/2017 per Dr. Paulita [name] revealed a fungating, sessile and ulcerated partially obstructing large mass in the recto-sigmoid colon. The mass was partially circumferential involving two-thirds of the lumen circumference. Oozing was present. Biopsy on 07/05/17 confirmed invasive adenocarcinoma of the rectum. Staging MRI on 07/06/17 revealed extension through muscularis propria 1-23m and nodes within the mesorectum and sigmoid meso colon measuring 986m indicating cT3N1Mo, stage IIIb. He was referred to GI surgery Dr. GrJohney Maineseen 07/09/2017.  Today he presents to clinic with a friend, referred by Dr. GrJohney MaineHe feels well overall with good appetite, stable weight, and no fatigue. His diarrhea has resolved but reports 2-3 loose stools per day, associated with cramping. No nausea, vomiting, or abdominal pain.   CURRENT THERAPY: PENDING adjuvant Xeloda    INTERIM HISTORY:  MaTyress Lodeneturns today for scheduled follow up [name] surgery. He presents to the clinic today accompanied by his wife. He is recovering from his resection surgery well. He continues to have some mild rectal pain, but this has been manageable and is still improving. His incision is healing well. He continues to have loose stools with his  bowel movements 4-6x daily. He did not take any Imodium. He has f/u w/ his surgeon, Dr GrJohney Mainelater today. He also reports that he has had some spasmodic pain along the midline of his abdomen with urination which will cause him to stop his flow, this has also been improving somewhat since his surgery as well.    In regards to his pathology results, he showed a complete response to neoadjuvant chemoradiation therapy.    MEDICAL HISTORY:  Past Medical History:  Diagnosis Date  . Allergy    grass, dander  . Essential hypertension 07/03/2017  . Family history of prostate cancer   . GERD (gastroesophageal reflux disease)   . History of meningitis   . Hypertension   . 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 COLONOSCOPY WITH PROPOFOL Left 07/05/2017   Procedure: COLONOSCOPY WITH PROPOFOL;  Surgeon: OuArta SilenceMD;  [facility];  Laterality: Left;  . PROCTOSCOPY N/A 10/10/2017   Procedure: RIGID PROCTOSCOPY;  Surgeon: GrMichael BostonMD;  Location: [facility];  Service: General;  Laterality: N/A;  . XI ROBOTIC ASSISTED LOWER ANTERIOR RESECTION N/A 10/10/2017   Procedure: XI ROBOTIC ASSISTED LOWER ANTERIOR RESECTION;  Surgeon: Paul Boston, MD;  Location: [facility];  Service: General;  Laterality: N/A;   SOCIAL HISTORY: Social History   Socioeconomic History  . Marital status: Married    Spouse name: Not on file  . Number of children: Not on file  . Years of education: Not on file  . Highest education level: Not on file  Social Needs  . Financial resource strain: Not on file  . Food insecurity - worry: Not on file  . Food insecurity - inability: Not on file  . Transportation needs - medical: Not on file  . Transportation needs - non-medical: Not on file  Occupational History  . Not on file    Tobacco Use  . Smoking status: Never Smoker  . Smokeless tobacco: Never Used  Substance and Sexual Activity  . Alcohol use: No  . Drug use: No  . Sexual activity: Not on file  Other Topics Concern  . Not on file  Social History Narrative   Pronouced "[place]--[place]"   Originally from [place], [place], [place]   Patient is married with 3 children, ages 65, 59, and 76. He lives in a home with his wife and kids. 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 He works 3-11:30 pm shift; he has morning/daytime availability for appointments and treatments. He prefers to work during anticipated treatment but can apply for [employer]. He will send appropriate paperwork when the time comes.   FAMILY HISTORY: Family History  Problem Relation Age of Onset  . Hypertension Mother   . Asthma Father   . Cancer Father        Prostate  . Asthma Son   . Asthma Son   . Asthma Daughter   . Dementia Maternal Grandmother    ALLERGIES:  is allergic to chloroquine.  MEDICATIONS:  Current Outpatient Medications  Medication Sig Dispense Refill  . amLODipine (NORVASC) 5 MG tablet TAKE 1 TABLET DAILY 90 tablet 1  . calcium carbonate (OS-CAL - DOSED IN MG OF ELEMENTAL CALCIUM) 1250 (500 Ca) MG tablet Take 1 tablet by mouth daily as needed (for bones).    . Calcium Citrate-Vitamin D (CALCIUM CITRATE+D3 PETITES PO) Take 1 tablet by mouth daily.    . cetirizine (ZYRTEC) 10 MG tablet Take 10 mg by mouth daily as needed for allergies.    Marland Kitchen GARLIC PO Take 1 capsule by mouth daily.     . Multiple Vitamin (MULTIVITAMIN WITH MINERALS) TABS tablet Take 1 tablet by mouth daily.    . naproxen (NAPROSYN) 500 MG tablet Take 1 tablet (500 mg total) by mouth every 12 (twelve) hours as needed for mild pain or moderate pain. 30 tablet 1  . omeprazole (PRILOSEC) 40 MG capsule Take 40 mg by mouth daily as needed (heart burn).     .  ondansetron (ZOFRAN) 8 MG tablet Take 1 tablet (8 mg total) by mouth every 8 (eight) hours as needed for nausea or vomiting. 30 tablet 3  . prochlorperazine (COMPAZINE) 10 MG tablet Take 1 tablet (10 mg total) by mouth every 6 (six) hours as needed for nausea or vomiting. 30 tablet 2  . traMADol (ULTRAM) 50 MG tablet Take 1-2 tablets (50-100 mg total) by mouth every 6 (six) hours as needed for moderate pain or severe pain. 30 tablet 0   No current facility-administered medications for this visit.    REVIEW OF SYSTEMS:  Constitutional: Denies fatigue, fevers, chills, abnormal night sweats, or weight loss. Eyes: Denies blurriness of vision, double vision or watery eyes Ears, nose, mouth, throat, and face: Denies mucositis or sore throat Respiratory: Denies cough, dyspnea or wheezes Cardiovascular: Denies palpitation, chest discomfort or lower extremity swelling Gastrointestinal:  no nausea, vomiting, constipation, controlled (+) rectal pain due to radiation, managed with pain medication (+) mild rectal bleeding Urinary: (+) stop and start of urination flow Skin: Denies abnormal skin rashes Lymphatics: Denies new lymphadenopathy or easy bruising Neurological:Denies numbness, tingling or new weaknesses Behavioral/Psych: Mood is stable, no new changes  All other systems were reviewed with the patient and are negative.  PHYSICAL EXAMINATION:  ECOG PERFORMANCE STATUS: 1  Vitals:   10/25/17 0950  BP: 139/82  Pulse: 71  Resp: 17  Temp: 98.2 F (36.8 C)  SpO2: 99%   Filed Weights   10/25/17 0950  Weight: 190 lb 11.2 oz (86.5 kg)    GENERAL:alert, no distress and comfortable SKIN: skin color, texture, turgor are normal, no rashes or significant lesions EYES: normal, conjunctiva are pink and non-injected, sclera clear OROPHARYNX:no exudate, no erythema and lips, buccal mucosa, and tongue normal  NECK: supple, thyroid normal size, non-tender, without nodularity LYMPH:  no palpable  cervical or supraclavicular lymphadenopathy  LUNGS: clear to auscultation bilaterally with normal breathing effort HEART: regular rate & rhythm, S1 and S2 present; no murmurs and no lower extremity edema ABDOMEN:abdomen soft, flat, non-tender and normal bowel sounds. No palpable hepatosplenomegaly or masses. Low anterior incision and laparoscopic incisions have both healed very well.  MSK:no cyanosis of digits and no clubbing  PSYCH: alert & oriented x 3 with fluent speech NEURO: no focal motor/sensory deficits RECTAL: exam was deferred   LABORATORY DATA:  I have reviewed the data as listed CBC Latest Ref Rng & Units 10/25/2017 10/19/2017 10/11/2017  WBC 4.0 - 10.3 10e3/uL 3.4(L) 3.1(L) 7.6  Hemoglobin 13.0 - 17.1 g/dL 12.7(L) 12.9(L) 11.5(L)  Hematocrit 38.4 - 49.9 % 38.0(L) 39.1 34.1(L)  Platelets 140 - 400 10e3/uL 200 195.0 202   CMP Latest Ref Rng & Units 10/25/2017 10/19/2017 10/11/2017  Glucose 70 - 140 mg/dl 88 101(H) 126(H)  BUN 7.0 - 26.0 mg/dL 11._0 Creatinine 0.7 - 1.3 mg/dL 0.9 0.85 0.80  Sodium 136 - 145 mEq/L 142 142 140  Potassium 3.5 - 5.1 mEq/L 4.4 4.2 3.8  Chloride 96 - 112 mEq/L - 104 109  CO2 22 - 29 mEq/L 31(H) 31 27  Calcium 8.4 - 10.4 mg/dL 9.6 9.2 8.7(L)  Total Protein 6.4 - 8.3 g/dL 7.3 7.3 -  Total Bilirubin 0.20 - 1.20 mg/dL 0.46 0.7 -  Alkaline Phos 40 - 150 U/L 80 62 -  AST 5 - 34 U/L 38(H) 20 -  ALT 0 - 55 U/L 43 23 -   PATHOLOGY RESULTS:  Diagnosis 10/10/17  1. Colon, segmental resection for tumor, sigmoid SMALL FOCUS OF COLONIC GLANDS WITH HIGH GRADE DYSPLASIA [name] NEOADJUVANT CHEMORADIATION THERAPY BIOPSY SITE WITH CHRONIC INFLAMMATION NO RESIDULE INVASIVE CARCINOMA PRESENT TWENTY-TWO BENIGN LYMPH NODES (0/22) 2. Colon, resection margin (donut), final distal BENIGN COLONIC TISSUE Microscopic Comment 1. COLON AND RECTUM (INCLUDING TRANS-ANAL RESECTION): Specimen: Sigmoid Procedure: Segmental resection Tumor site: Posterior wall of the  proximal rectum Specimen integrity: Intact Macroscopic intactness of mesorectum: Not applicable: X Complete: NA Near complete: NA Incomplete: NA Cannot be determined (specify): NA Macroscopic tumor perforation: No residual tumor identified Invasive tumor: Maximum size: No residual tumor Histologic type(s): adenocarcinoma (biopsy  [id number]) Histologic grade and differentiation: G3 G1: well differentiated/low grade G2: moderately differentiated/low grade G3: poorly differentiated/high grade G4: undifferentiated/high grade 1 of 3 FINAL for DEARIES, MEIKLE [phone]) Microscopic Comment(continued) Type of polyp in which invasive carcinoma arose: Tubular adenoma Microscopic extension of invasive tumor: No residual invasive carcinoma Lymph-Vascular invasion: Negative Peri-neural invasion: Negative Tumor deposit(s) (discontinuous extramural extension): negative Resection margins: Proximal margin: Negative Distal margin: Negative Circumferential (radial) (posterior ascending, posterior descending; lateral and posterior mid-rectum; and entire lower 1/3 rectum):Negative Mesenteric margin (sigmoid and transverse): negative Distance closest margin (if all above margins negative): NA Trans-anal resection margins only: Deep margin: NA Mucosal Margin: NA Distance closest mucosal margin (if negative): NA Treatment effect (neo-adjuvant therapy): Complete response Additional polyp(s): Negative Non-neoplastic findings: unremarkable Lymph nodes: number examined 22; number positive: 0 Pathologic Staging: ypTis, ypN0, ypMx Ancillary studies: per request    Diagnosis 07/05/17 Rectum, biopsy - INVASIVE ADENOCARCINOMA. - SEE COMMENT. Microscopic Comment The adenocarcinoma is poorly differentiated. Dr. Vicente Males has reviewed the case and concurs with this interpretation. Dr. Paulita [name] was paged on 07/06/17. (JBK:gt, 07/06/17)  PROCEDURES  COLONOSCOPY: Dr. Paulita [name] 07/05/2017 - Internal  hemorrhoids. - Likely malignant partially obstructing tumor in the recto-sigmoid colon. Injected. Biopsied.   RADIOGRAPHIC STUDIES: I have personally reviewed the radiological images as listed and agreed with the findings in the report. No results found.  ASSESSMENT & PLAN:  Daneil Beem is a 53 y.o.  with history of allergies, HTN, GERD, and chronic low back pain. He presents with rectal cancer diagnosed 07/05/2017.  1. Poorly differentiated invasive rectal adenocarcinoma, cT3N1M0, stage IIIb, ypT0N0 -I previously reviewed his biopsy and CT, MRI scan results with the patient and his friend in detail.  -His tumor is located in the rectosigmoid colon. -We discussed he has likely locally advanced stage IIIb disease based on tumor size, invasion into the muscularis, and positive lymph node, no distant metastasis on day CT and MRI of abdomen and pelvis. -CT chest was negative for metastasis -He has seen Dr. Johney [name] on 07/09/17 who recommends neoadjuvant chemoradiation to help shrink tumor followed by surgical low anterior resection with possible adjuvant chemotherapy after surgery. We are in agreement with this plan. -He underwent concurrent chemoRT with Xeloda on 07/18/17-08/24/17, with mild nausea and rectal bleeding and residual effects on his urine output flow and rectal pain.  -He underwent low anterior resection surgery on 10/10/17, his surgical pathology showed complete response to to neoadjuvant chemoradiation therapy.  No residual tumor, or 22 lymph nodes were negative. --I discussed the role of adjuvant chemotherapy after surgery. Given his locally advanced disease, positive notes on the initial scan, the standard adjuvant regimen is FOLFOX or CAPOX for 3-4 months.  However, given his excellent response to neoadjuvant chemoradiation, no residual tumor, I think single agent Xeloda will be also a reasonable option.  We discussed that we do not have a great clinical data to support no adjuvant  chemo after completed pathological response after chemoradiation.  -Patient was initially thinking he may not need adjuvant chemotherapy.  After lengthy discussion, he is agreeable to proceed adjuvant Xeloda for 3-4 months. -Labs reviewed, his white blood counts are still slightly low from chemoradiation but this is improving, his Hg has also recovered further to 12.7. -He is recovering well from surgery, also has not completely recovered.  He wishes to start chemotherapy in the end of the month.   2. 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    3. Nausea and diarrhea -He has been having diarrhea since his surgery, still has loose bowel movement 3-5 times a day.  He is going to follow-up with Dr. Johney [name] today, I suggested him to take Imodium if that is okay with Dr. Johney [name]. -Mild nausea, he takes antiemetics as needed.   PLAN: -lab and f/u in 3 weeks, plan to start xeloda 2030m bid, 2 weeks on and 1 week off after next visit    No orders of the defined types were placed in this encounter.  All questions were answered. The patient knows to call the clinic with any problems, questions or concerns.  YTruitt Merle MD 10/25/2017 12:15 PM   This document serves as a record of services personally performed by YTruitt Merle MD. It was created on her behalf by WReola Mosher a trained medical scribe. The creation of this record is based on the scribe's personal observations and the provider's statements to them. This document has been checked and approved by the attending provider.  I have reviewed the above documentation for accuracy and completeness, and I agree with the above.

## 2017-10-26 ENCOUNTER — Telehealth: Payer: Self-pay | Admitting: *Deleted

## 2017-10-26 NOTE — Telephone Encounter (Signed)
Left message on pt's mobile vm regarding low iron & to p/u ferrous sulfate OTC & start daily & to call if any questions on Monday.

## 2017-10-26 NOTE — Telephone Encounter (Signed)
-----   Message from Truitt Merle, MD sent at 10/25/2017  1:07 PM EST ----- Please let pt know the lab result from today showed mild iron deficiency, I suggest him to take OTC ferrous sulfate once daily. Thanks  Truitt Merle  10/25/2017

## 2017-10-31 ENCOUNTER — Telehealth: Payer: Self-pay | Admitting: Pharmacist

## 2017-10-31 DIAGNOSIS — C20 Malignant neoplasm of rectum: Secondary | ICD-10-CM

## 2017-10-31 NOTE — Telephone Encounter (Signed)
Oral Oncology Pharmacist Encounter  Received new prescription for capectabine for the treatment of rectal cancer stage in conjunction, planned duration 3-4 months. He previously received capecitabine + radiation in neoadjuvant setting.   Spoke with patient about restarting capecitabine at new dose/schedule. He said he had some diarrhea with capecitabine last time and loperamide helped. He also said he had some nausea. I recommended he ask Dr. Burr Medico for refills on his nausea meds at his next appointment, since he did not have many tablets left. Also recommended that he can take an anti-nausea tablet 30 min before his capecitabine if he finds he is consistently getting nausea from it. He will let us know if any side effects are getting severe.  Start date: will begin after next office visit on 1/24  Dose: Capecitabine 2000mg  PO BID within 30 minutes of a meal for 14 days on and 7 days off every 21 days.   Labs from 10/25/17 assessed, okay to for treatment.  Current medication list in Epic reviewed, no serious DDIs with capecitabine identified.  Prescription has been sent to [facility] for processing and filling. Patient has used this pharmacy before.   Paul [name], PharmD PGY2 Oncology Pharmacy Resident  Pharmacy Phone: [phone] 10/31/2017

## 2017-11-01 MED ORDER — CAPECITABINE 500 MG PO TABS: 2000.0000 mg | ORAL_TABLET | Freq: Two times a day (BID) | ORAL | 3 refills | Status: DC

## 2017-11-06 ENCOUNTER — Other Ambulatory Visit: Payer: Self-pay | Admitting: Pharmacist

## 2017-11-09 NOTE — Telephone Encounter (Signed)
Oral Oncology Patient Advocate Encounter  Confirmed with [place] today that the patient will be receiving his medication on 11/14/2017.   Paul [name]. Melynda Keller, [place] Patient [facility] [phone] 11/09/2017 4:20 PM

## 2017-11-15 ENCOUNTER — Encounter: Payer: Self-pay | Admitting: Nurse Practitioner

## 2017-11-15 ENCOUNTER — Inpatient Hospital Stay: Payer: BLUE CROSS/BLUE SHIELD

## 2017-11-15 ENCOUNTER — Telehealth: Payer: Self-pay | Admitting: *Deleted

## 2017-11-15 ENCOUNTER — Telehealth: Payer: Self-pay | Admitting: Nurse Practitioner

## 2017-11-15 ENCOUNTER — Inpatient Hospital Stay (HOSPITAL_BASED_OUTPATIENT_CLINIC_OR_DEPARTMENT_OTHER): Payer: BLUE CROSS/BLUE SHIELD | Admitting: Nurse Practitioner

## 2017-11-15 VITALS — BP 137/91 | HR 80 | Temp 98.5°F | Resp 17 | Ht 72.75 in | Wt 192.7 lb

## 2017-11-15 DIAGNOSIS — Z79899 Other long term (current) drug therapy: Secondary | ICD-10-CM | POA: Diagnosis not present

## 2017-11-15 DIAGNOSIS — D649 Anemia, unspecified: Secondary | ICD-10-CM

## 2017-11-15 DIAGNOSIS — R350 Frequency of micturition: Secondary | ICD-10-CM

## 2017-11-15 DIAGNOSIS — D500 Iron deficiency anemia secondary to blood loss (chronic): Secondary | ICD-10-CM

## 2017-11-15 DIAGNOSIS — C20 Malignant neoplasm of rectum: Secondary | ICD-10-CM

## 2017-11-15 DIAGNOSIS — G8929 Other chronic pain: Secondary | ICD-10-CM | POA: Diagnosis not present

## 2017-11-15 DIAGNOSIS — I10 Essential (primary) hypertension: Secondary | ICD-10-CM

## 2017-11-15 LAB — CBC WITH DIFFERENTIAL/PLATELET
BASOS ABS: 0 10*3/uL (ref 0.0–0.1)
BASOS PCT: 0 %
EOS ABS: 0.2 10*3/uL (ref 0.0–0.5)
EOS PCT: 6 %
HCT: 37.8 % — ABNORMAL LOW (ref 38.4–49.9)
Hemoglobin: 12.6 g/dL — ABNORMAL LOW (ref 13.0–17.1)
Lymphocytes Relative: 21 %
Lymphs Abs: 0.6 10*3/uL — ABNORMAL LOW (ref 0.9–3.3)
MCH: 27.9 pg (ref 27.2–33.4)
MCHC: 33.3 g/dL (ref 32.0–36.0)
MCV: 83.8 fL (ref 79.3–98.0)
MONO ABS: 0.4 10*3/uL (ref 0.1–0.9)
Monocytes Relative: 13 %
Neutro Abs: 1.7 10*3/uL (ref 1.5–6.5)
Neutrophils Relative %: 60 %
PLATELETS: 174 10*3/uL (ref 140–400)
RBC: 4.51 MIL/uL (ref 4.20–5.82)
RDW: 14.2 % (ref 11.0–15.6)
WBC: 2.9 10*3/uL — AB (ref 4.0–10.3)

## 2017-11-15 LAB — URINALYSIS, COMPLETE (UACMP) WITH MICROSCOPIC
Bilirubin Urine: NEGATIVE
GLUCOSE, UA: NEGATIVE mg/dL
KETONES UR: NEGATIVE mg/dL
Leukocytes, UA: NEGATIVE
Nitrite: NEGATIVE
PROTEIN: NEGATIVE mg/dL
Specific Gravity, Urine: 1.012 (ref 1.005–1.030)
pH: 6 (ref 5.0–8.0)

## 2017-11-15 LAB — COMPREHENSIVE METABOLIC PANEL
ALBUMIN: 3.9 g/dL (ref 3.5–5.0)
ALT: 32 U/L (ref 0–55)
AST: 22 U/L (ref 5–34)
Alkaline Phosphatase: 81 U/L (ref 40–150)
Anion gap: 8 (ref 3–11)
BUN: 8 mg/dL (ref 7–26)
CHLORIDE: 105 mmol/L (ref 98–109)
CO2: 29 mmol/L (ref 22–29)
Calcium: 9.3 mg/dL (ref 8.4–10.4)
Creatinine, Ser: 0.83 mg/dL (ref 0.70–1.30)
GFR calc Af Amer: >60 mL/min (ref >60)
GLUCOSE: 101 mg/dL (ref 70–140)
POTASSIUM: 3.8 mmol/L (ref 3.5–5.1)
SODIUM: 142 mmol/L (ref 136–145)
Total Bilirubin: 0.4 mg/dL (ref 0.2–1.2)
Total Protein: 7.1 g/dL (ref 6.4–8.3)

## 2017-11-15 MED ORDER — FERROUS SULFATE 325 (65 FE) MG PO TBEC: 325.0000 mg | DELAYED_RELEASE_TABLET | Freq: Every day | ORAL | 1 refills | Status: DC

## 2017-11-15 NOTE — Progress Notes (Addendum)
[place]  [phone] [phone]  Clinic Follow up Note   Patient Care Team: Eulas Post, MD as PCP - General (Family Medicine) Arta Silence, MD as Consulting Physician (Gastroenterology) Michael Boston, MD as Consulting Physician (General Surgery) Kyung Rudd, MD as Consulting Physician (Radiation Oncology) Truitt Merle, MD as Consulting Physician (Oncology) 11/15/2017  CHIEF COMPLAINT: Follow up rectal cancer   SUMMARY OF ONCOLOGIC HISTORY: Oncology History   Cancer Staging Rectal adenocarcinoma [facility]) Staging form: Colon and Rectum, AJCC 8th Edition - Clinical stage from 07/06/2017: Stage IIIB (cT3, cN1, cM0) - Signed by [name], NP on 07/10/2017      Rectal adenocarcinoma s/p robotic LAR resection 10/10/2017   07/03/2017 Imaging    CT ABD PELVIS W CONTRAST IMPRESSION: Possible mass in the rectum compatible carcinoma. Endoscopy recommended for further evaluation. Otherwise negative.      07/03/2017 Tumor Marker    CEA 2.7      07/05/2017 Initial Biopsy    Rectum biopsy -invasive adenocarcinoma, poorly differentiated      07/05/2017 Procedure    Colonoscopy Per Dr. Paulita Fujita Findings: The perianal and digital rectal examinations were normal.  Internal hemorrhoids were found during retroflexion. The hemorrhoids were moderate. No additional abnormalities were found on retroflexion.  A fungating, sessile and ulcerated partially obstructing large mass was found in the recto-sigmoid colon.The mass was partially circumferential (involving two-thirds of the lumen circumference). Oozing was present. Area was successfully injected with 2 mL [place] ink for tattooing. This was biopsied with a cold forceps for histology.      07/05/2017 Initial Diagnosis    Rectal adenocarcinoma ([place])      07/06/2017 Imaging    Staging MRI ABD/PELVIS revealed clinical stage T3N1M0, IIIb       07/18/2017 - 08/24/2017 Radiation Therapy   Radaition with Dr Lisbeth Renshaw     Radiation treatment dates:   07/18/2017 - 08/24/2017  Site/dose:   The rectum was treated to 45 Gy in 25 fractions of 1.8 Gy, followed by a 5.4 Gy boost in 3 fractions to yield a total dose of 50.4 Gy.  Narrative: The patient tolerated radiation treatment relatively well.   He had rectal bleeding with clots that was bright red after bowel movements. He reported loose stools that were painful. The skin to the radiation site was irritated, but no skin breakage was noted.       07/18/2017 Imaging    CT CHEST  IMPRESSION: Negative. No evidence of thoracic metastatic disease or other significant abnormality.       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  The Hereditary Gene Panel offered by [facility] includes sequencing and/or deletion duplication testing of the following 47 genes: APC, ATM, AXIN2, BARD1, BMPR1A, BRCA1, BRCA2, BRIP1, CDH1, CDK4, CDKN2A (p14ARF), CDKN2A (p16INK4a), CHEK2, CTNNA1, DICER1, EPCAM (Deletion/duplication testing only), GREM1 (promoter region deletion/duplication testing only), KIT, MEN1, MLH1, MSH2, MSH3, MSH6, MUTYH, NBN, NF1, NHTL1, PALB2, PDGFRA, PMS2, POLD1, POLE, PTEN, RAD50, RAD51C, RAD51D, SDHB, SDHC, SDHD, SMAD4, SMARCA4. STK11, TP53, TSC1, TSC2, and VHL.  The following genes were evaluated for sequence changes only: SDHA and HOXB13 c.251G>A variant only.  The report date is August 27, 2017.       10/10/2017 Surgery    XI ROBOTIC ASSISTED LOWER ANTERIOR RESECTION AND  RIGID PROCTOSCOPY bu Dr. Johney Maine and Dr. Dema Severin  10/10/17       10/10/2017 Pathology Results        Diagnosis 10/10/17  1. Colon, segmental resection for tumor, sigmoid SMALL FOCUS OF COLONIC GLANDS WITH HIGH GRADE DYSPLASIA POST NEOADJUVANT CHEMORADIATION  THERAPY BIOPSY SITE WITH CHRONIC INFLAMMATION NO RESIDULE INVASIVE CARCINOMA PRESENT TWENTY-TWO BENIGN LYMPH NODES (0/22) 2. Colon, resection margin (donut), final distal BENIGN COLONIC TISSUE     CURRENT THERAPY: PENDING adjuvant Xeloda   INTERVAL HISTORY: Paul [name] returns for follow up as scheduled prior to beginning adjuvant Xeloda. He continues to recover well from surgery. Eating and drinking well, no weight loss. Does report frequent soft BM, average 6 times but more than 8 times daily on occasion. Taking fiber and metamucil per Dr. Johney Maine. Has not tried imodium. Has brief abdominal cramping and gas before BM that resolves after. Denies blood or pain with BM. Notes urinary frequency, occasional dysuria, and sensation of incomplete bladder emptying present since chemoradiation. No hematuria, fever, or chills. He is otherwise doing well, eager to get back to work.   REVIEW OF SYSTEMS:   Constitutional: Denies fatigue, fevers, chills or abnormal weight loss Eyes: Denies blurriness of vision Ears, nose, mouth, throat, and face: Denies mucositis or sore throat Respiratory: Denies cough, dyspnea or wheezes Cardiovascular: Denies palpitation, chest discomfort or lower extremity swelling Gastrointestinal:  Denies nausea, vomiting, diarrhea constipation, heartburn (+) gas (+) frequent BM 6-8+ per day (+) brief cramp before BM GU: Denies hematuria (+) urinary frequency (+) sensation of incomplete bladder emptying (+) occasional dysuria Skin: Denies abnormal skin rashes Lymphatics: Denies new lymphadenopathy or easy bruising Neurological:Denies numbness, tingling or new weaknesses Behavioral/Psych: Mood is stable, no new changes  All other systems were reviewed with the patient and are negative.  MEDICAL HISTORY:  Past Medical History:  Diagnosis Date  . Allergy    grass, dander  . Essential hypertension 07/03/2017  . Family history of prostate cancer   . GERD (gastroesophageal reflux  disease)   . History of meningitis   . Hypertension   . 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 COLONOSCOPY WITH PROPOFOL Left 07/05/2017   Procedure: COLONOSCOPY WITH PROPOFOL;  Surgeon: Arta Silence, MD;  Location: [place];  Service: Endoscopy;  Laterality: Left;  . PROCTOSCOPY N/A 10/10/2017   Procedure: RIGID PROCTOSCOPY;  Surgeon: Michael Boston, MD;  Location: [facility];  Service: General;  Laterality: N/A;  . XI ROBOTIC ASSISTED LOWER ANTERIOR RESECTION N/A 10/10/2017   Procedure: XI ROBOTIC ASSISTED LOWER ANTERIOR RESECTION;  Surgeon: Michael Boston, MD;  Location: [facility];  Service: General;  Laterality: N/A;    I have reviewed the social history and family history with the patient and they are unchanged from previous note.  ALLERGIES:  is allergic to chloroquine.  MEDICATIONS:  Current Outpatient Medications  Medication Sig Dispense Refill  . amLODipine (NORVASC) 5 MG tablet TAKE 1 TABLET DAILY 90 tablet 1  . calcium carbonate (OS-CAL - DOSED IN MG OF ELEMENTAL CALCIUM) 1250 (500 Ca) MG tablet Take 1 tablet by mouth daily as needed (for bones).    . Calcium Citrate-Vitamin D (CALCIUM CITRATE+D3 PETITES PO) Take 1 tablet by mouth daily.    . cetirizine (ZYRTEC) 10 MG tablet Take 10 mg by mouth daily as needed for allergies.    Marland Kitchen GARLIC PO Take 1  capsule by mouth daily.     . Multiple Vitamin (MULTIVITAMIN WITH MINERALS) TABS tablet Take 1 tablet by mouth daily.    . naproxen (NAPROSYN) 500 MG tablet Take 1 tablet (500 mg total) by mouth every 12 (twelve) hours as needed for mild pain or moderate pain. 30 tablet 1  . omeprazole (PRILOSEC) 40 MG capsule Take 40 mg by mouth daily as needed (heart burn).     . ondansetron (ZOFRAN) 8 MG tablet Take 1 tablet (8 mg total) by mouth every 8 (eight) hours as needed for  nausea or vomiting. 30 tablet 3  . prochlorperazine (COMPAZINE) 10 MG tablet Take 1 tablet (10 mg total) by mouth every 6 (six) hours as needed for nausea or vomiting. 30 tablet 2  . traMADol (ULTRAM) 50 MG tablet Take 1-2 tablets (50-100 mg total) by mouth every 6 (six) hours as needed for moderate pain or severe pain. 30 tablet 0  . capecitabine (XELODA) 500 MG tablet Take 4 tablets (2,000 mg total) by mouth 2 (two) times daily after a meal. Take on days 1-14 and off days 15-21 every 21 days. (Patient not taking: Reported on 11/15/2017) 112 tablet 3  . ferrous sulfate 325 (65 FE) MG EC tablet Take 1 tablet (325 mg total) by mouth daily. 30 tablet 1   No current facility-administered medications for this visit.     PHYSICAL EXAMINATION: ECOG PERFORMANCE STATUS: 1 - Symptomatic but completely ambulatory  Vitals:   11/15/17 0925  BP: (!) 137/91  Pulse: 80  Resp: 17  Temp: 98.5 F (36.9 C)  SpO2: 100%   Filed Weights   11/15/17 0925  Weight: 192 lb 11.2 oz (87.4 kg)    GENERAL:alert, no distress and comfortable SKIN: skin color, texture, turgor are normal, no rashes or significant lesions EYES: normal, Conjunctiva are pink and non-injected, sclera clear OROPHARYNX:no exudate, no erythema and lips, buccal mucosa, and tongue normal  NECK: supple, thyroid normal size, non-tender, without nodularity LYMPH:  no palpable cervical, supraclavicular, axillary, or inguinal lymphadenopathy LUNGS: clear to auscultation bilaterally with normal breathing effort HEART: regular rate & rhythm and no murmurs and no lower extremity edema ABDOMEN:abdomen soft, non-tender and normal bowel sounds. No hepatomegaly. No suprapelvic tenderness  Musculoskeletal:no cyanosis of digits and no clubbing  NEURO: alert & oriented x 3 with fluent speech, no focal motor/sensory deficits RECTAL: external exam reveals intact skin without ulceration, breakdown, or discoloration. Internal rectal exam  deferred  LABORATORY DATA:  I have reviewed the data as listed CBC Latest Ref Rng & Units 11/15/2017 10/25/2017 10/19/2017  WBC 4.0 - 10.3 K/uL 2.9(L) 3.4(L) 3.1(L)  Hemoglobin 13.0 - 17.1 g/dL 12.6(L) 12.7(L) 12.9(L)  Hematocrit 38.4 - 49.9 % 37.8(L) 38.0(L) 39.1  Platelets 140 - 400 K/uL 174 200 195.0     CMP Latest Ref Rng & Units 11/15/2017 10/25/2017 10/19/2017  Glucose 70 - 140 mg/dL 101 88 101(H)  BUN 7 - 26 mg/dL 8 11.3 10  Creatinine 0.70 - 1.30 mg/dL 0.83 0.9 0.85  Sodium 136 - 145 mmol/L 142 142 142  Potassium 3.5 - 5.1 mmol/L 3.8 4.4 4.2  Chloride 98 - 109 mmol/L 105 - 104  CO2 22 - 29 mmol/L 29 31(H) 31  Calcium 8.4 - 10.4 mg/dL 9.3 9.6 9.2  Total Protein 6.4 - 8.3 g/dL 7.1 7.3 7.3  Total Bilirubin 0.2 - 1.2 mg/dL 0.4 0.46 0.7  Alkaline Phos 40 - 150 U/L 81 80 62  AST 5 - 34 U/L 22 38(H)  20  ALT 0 - 55 U/L 32 43 23   PATHOLOGY RESULTS:  Diagnosis 10/10/17  1. Colon, segmental resection for tumor, sigmoid SMALL FOCUS OF COLONIC GLANDS WITH HIGH GRADE DYSPLASIA POST NEOADJUVANT CHEMORADIATION THERAPY BIOPSY SITE WITH CHRONIC INFLAMMATION NO RESIDULE INVASIVE CARCINOMA PRESENT TWENTY-TWO BENIGN LYMPH NODES (0/22) 2. Colon, resection margin (donut), final distal BENIGN COLONIC TISSUE Microscopic Comment 1. COLON AND RECTUM (INCLUDING TRANS-ANAL RESECTION): Specimen: Sigmoid Procedure: Segmental resection Tumor site: Posterior wall of the proximal rectum Specimen integrity: Intact Macroscopic intactness of mesorectum: Not applicable: X Complete: NA Near complete: NA Incomplete: NA Cannot be determined (specify): NA Macroscopic tumor perforation: No residual tumor identified Invasive tumor: Maximum size: No residual tumor Histologic type(s): adenocarcinoma (biopsy [id number]) Histologic grade and differentiation: G3 G1: well differentiated/low grade G2: moderately differentiated/low grade G3: poorly differentiated/high grade G4: undifferentiated/high  grade 1 of 3 FINAL for Paul [name], Paul [name] [phone]) Microscopic Comment(continued) Type of polyp in which invasive carcinoma arose: Tubular adenoma Microscopic extension of invasive tumor: No residual invasive carcinoma Lymph-Vascular invasion: Negative Peri-neural invasion: Negative Tumor deposit(s) (discontinuous extramural extension): negative Resection margins: Proximal margin: Negative Distal margin: Negative Circumferential (radial) (posterior ascending, posterior descending; lateral and posterior mid-rectum; and entire lower 1/3 rectum):Negative Mesenteric margin (sigmoid and transverse): negative Distance closest margin (if all above margins negative): NA Trans-anal resection margins only: Deep margin: NA Mucosal Margin: NA Distance closest mucosal margin (if negative): NA Treatment effect (neo-adjuvant therapy): Complete response Additional polyp(s): Negative Non-neoplastic findings: unremarkable Lymph nodes: number examined 22; number positive: 0 Pathologic Staging: ypTis, ypN0, ypMx Ancillary studies: per request    Diagnosis 07/05/17 Rectum, biopsy - INVASIVE ADENOCARCINOMA. - SEE COMMENT. Microscopic Comment The adenocarcinoma is poorly differentiated. Dr. Vicente Males has reviewed the case and concurs with this interpretation. Dr. Paulita Fujita was paged on 07/06/17. (JBK:gt, 07/06/17)  PROCEDURES  COLONOSCOPY: Dr. Paulita Fujita 07/05/2017 - Internal hemorrhoids. - Likely malignant partially obstructing tumor in the recto-sigmoid colon. Injected. Biopsied.    RADIOGRAPHIC STUDIES: I have personally reviewed the radiological images as listed and agreed with the findings in the report. No results found.   ASSESSMENT & PLAN: Paul [name] is a 52 y.o.  with history of allergies, HTN, GERD, and chronic low back pain. He presents with rectal cancer diagnosed 07/05/2017.  1. Poorly differentiated invasive rectal adenocarcinoma, cT3N1M0, stage IIIb, ypT0N0 2.  Genetics 3. Nausea and diarrhea 4. Urinary frequency, dysuria, incomplete bladder emptying  Ms. Dolney appears stable today. He has recovered well from surgery. Good po intake, weight is stable. BP elevated, he does not take norvasc consistently. I recommend he take 1 tablet daily. He does not need a refill. Labs reviewed, WBC mildly decreased from previous which may be delayed recovery from chemoradiation. ANC 1.7 WNL. Will monitor closely. Anemia stable, Hgb 12.6. Iron studies indicate iron deficiency, he will begin oral iron supplementation 1 tablet daily, prescription sent to pharmacy. UA today negative for infection, symptoms likely secondary to radiation. Labs adequate to begin adjuvant Xeloda, will take 2000 mg BID days 1-14 with 7 days off every 21 days; plans to begin 11/19/17. We reviewed possible skin and GI toxicities. He will begin imodium as needed for frequent BM and diarrhea. He will increase water intake. He wants to return to work 11/26/17, I provided a letter for him to return with light work duties. we reviewed infection precautions. Return for lab and f/u with Dr. Burr Medico in 3 weeks prior to cycle 2.   PLAN -UA negative for infection, patient notified -Begin  oral iron supplementation 1 tablet daily, prescription sent to [facility], 4 tabs BID days 1-14, and 7 days off every 21 days - on 1/28 -Return for lab and f/u in 3 weeks prior to cycle 2 -Letter given to return to work 2/4 -Continue BP medication daily  Orders Placed This Encounter  Procedures  . Urinalysis, Complete w Microscopic    Standing Status:   Future    Standing Expiration Date:   11/15/2018   All questions were answered. The patient knows to call the clinic with any problems, questions or concerns. No barriers to learning was detected.    [name], NP 11/15/17

## 2017-11-15 NOTE — Telephone Encounter (Signed)
Notified pt that u/a was negative, no UTI.  Pt expressed understanding of good report.

## 2017-11-15 NOTE — Telephone Encounter (Signed)
Scheduled appt per 1/24 los - Patient aware of appt date and time - no print out wanted.

## 2017-11-20 ENCOUNTER — Emergency Department (HOSPITAL_COMMUNITY)
Admission: EM | Admit: 2017-11-20 | Discharge: 2017-11-20 | Disposition: A | Discharge status: Home / Self Care | Payer: BLUE CROSS/BLUE SHIELD | Attending: Emergency Medicine | Admitting: Emergency Medicine

## 2017-11-20 ENCOUNTER — Telehealth: Payer: Self-pay | Admitting: *Deleted

## 2017-11-20 ENCOUNTER — Encounter (HOSPITAL_COMMUNITY): Payer: Self-pay | Admitting: Emergency Medicine

## 2017-11-20 DIAGNOSIS — Z79899 Other long term (current) drug therapy: Secondary | ICD-10-CM | POA: Diagnosis not present

## 2017-11-20 DIAGNOSIS — K5900 Constipation, unspecified: Secondary | ICD-10-CM | POA: Insufficient documentation

## 2017-11-20 DIAGNOSIS — I10 Essential (primary) hypertension: Secondary | ICD-10-CM | POA: Insufficient documentation

## 2017-11-20 DIAGNOSIS — Z85048 Personal history of other malignant neoplasm of rectum, rectosigmoid junction, and anus: Secondary | ICD-10-CM | POA: Diagnosis not present

## 2017-11-20 DIAGNOSIS — K599 Functional intestinal disorder, unspecified: Secondary | ICD-10-CM

## 2017-11-20 NOTE — Telephone Encounter (Signed)
Received request yesterday from Paul [name] to call to verify current treatment plan.  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

## 2017-11-20 NOTE — ED Triage Notes (Signed)
Per pt, states he has a history of rectal/colon cancer-had history in December-states he recently started a med which has been causing some constipation-states he went to bathroom and something is hanging out of his rectum-denies pain

## 2017-11-20 NOTE — ED Provider Notes (Signed)
[facility] DEPT Provider Note   CSN: [id number] Arrival date & time: 11/20/17  0808     History   Chief Complaint Chief Complaint  Patient presents with  . bowel issue    HPI Paul [name] is a 52 y.o. male with a history of rectal adenocarcinoma status post robotic LAR resection on 10/10/2017 by Dr. gross who presents the emergency department today for suspected bowel prolapse.  Patient notes that after the surgery he was having approximately 8 soft bowel movements per day.  He was told this was normal and would take some time to resolve.  He saw his oncologist on 11/15/2017 where he was still having continued 8, soft bowel movements per day.  He was recommended to start Imodium.  He states this did reduce his bowel movements to 4-5 times per day and there were more firm.  Yesterday he started a new medication as well called Xeloda.  Since that he has not felt constipated and unable to have a successful bowel movement.  This morning when he woke he had a urgency to have a bowel movement and was straining when he felt a tissue prolapse.  The tissue still feels as though his prolapse.  No interventions have been performed for this.  He has no associated pain with this.  He denies fever, chills, nausea, emesis, abdominal pain.  HPI  Past Medical History:  Diagnosis Date  . Allergy    grass, dander  . Essential hypertension 07/03/2017  . Family history of prostate cancer   . GERD (gastroesophageal reflux disease)   . History of meningitis   . Hypertension   . 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 Iron deficiency anemia due to chronic blood loss 09/03/2017  . Genetic testing 08/30/2017  . Family history of prostate cancer   . 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  Essential hypertension 07/03/2017  . Elevated blood pressure 08/14/2013  . GERD (gastroesophageal reflux disease) 09/10/2012    Past Surgical History:  Procedure Laterality Date  . COLONOSCOPY WITH PROPOFOL Left 07/05/2017   Procedure: COLONOSCOPY WITH PROPOFOL;  Surgeon: Arta Silence, MD;  Location: [facility];  Service: Endoscopy;  Laterality: Left;  . PROCTOSCOPY N/A 10/10/2017   Procedure: RIGID PROCTOSCOPY;  Surgeon: Jalynn Waddell Boston, MD;  Location: [facility];  Service: General;  Laterality: N/A;  . XI ROBOTIC ASSISTED LOWER ANTERIOR RESECTION N/A 10/10/2017   Procedure: XI ROBOTIC ASSISTED LOWER ANTERIOR RESECTION;  Surgeon: Reyah Streeter Boston, MD;  Location: [facility];  Service: General;  Laterality: N/A;       Home Medications    Prior to Admission medications   Medication Sig Start Date End Date Taking? Authorizing Provider  calcium carbonate (OS-CAL - DOSED IN MG OF ELEMENTAL CALCIUM) 1250 (500 Ca) MG tablet Take 1 tablet by mouth daily as needed (for bones).   Yes [provider]  Calcium Citrate-Vitamin D (CALCIUM CITRATE+D3 PETITES PO) Take 1 tablet by mouth daily.   Yes [provider]  capecitabine (XELODA) 500 MG tablet Take 4 tablets (2,000 mg total) by mouth 2 (two) times daily after a meal. Take on days 1-14 and off days 15-21 every 21 days. 11/01/17  Yes Truitt Merle, MD  ferrous sulfate 325 (65 FE) MG EC tablet Take 1 tablet (325 mg total) by mouth daily. 11/15/17  Yes Kalman Shan,  Wilhemina Cash, NP  GARLIC PO Take 1 capsule by mouth daily.    Yes [provider]  Multiple Vitamin (MULTIVITAMIN WITH MINERALS) TABS tablet Take 1 tablet by mouth daily.   Yes [provider]  prochlorperazine (COMPAZINE) 10 MG tablet Take 1 tablet (10 mg total) by mouth every 6 (six) hours as needed for nausea or vomiting. 08/06/17  Yes [name], NP  amLODipine (NORVASC) 5 MG tablet TAKE 1 TABLET DAILY Patient not taking: Reported on 11/20/2017 03/12/17   Eulas Post, MD  cetirizine (ZYRTEC) 10 MG tablet Take 10 mg by mouth daily as needed for allergies.    [provider]  naproxen (NAPROSYN) 500 MG tablet Take 1 tablet (500 mg total) by mouth every 12 (twelve) hours as needed for mild pain or moderate pain. Patient not taking: Reported on 11/20/2017 10/10/17   Hodge Stachnik Boston, MD  omeprazole (PRILOSEC) 40 MG capsule Take 40 mg by mouth daily as needed (heart burn).  09/09/12   Burchette, Alinda Sierras, MD  ondansetron (ZOFRAN) 8 MG tablet Take 1 tablet (8 mg total) by mouth every 8 (eight) hours as needed for nausea or vomiting. Patient not taking: Reported on 11/20/2017 07/19/17   Hayden Pedro, PA-C  traMADol (ULTRAM) 50 MG tablet Take 1-2 tablets (50-100 mg total) by mouth every 6 (six) hours as needed for moderate pain or severe pain. Patient not taking: Reported on 11/20/2017 10/10/17   Delonda Coley Boston, MD    Family History Family History  Problem Relation Age of Onset  . Hypertension Mother   . Asthma Father   . Cancer Father        Prostate  . Asthma Son   . Asthma Son   . Asthma Daughter   . Dementia Maternal Grandmother     Social History Social History   Tobacco Use  . Smoking status: Never Smoker  . Smokeless tobacco: Never Used  Substance Use Topics  . Alcohol use: No  . Drug use: No     Allergies   Chloroquine   Review of Systems Review of Systems  All other systems reviewed and are negative.    Physical Exam Updated Vital Signs BP (!) 153/96   Pulse 88   Temp 97.8 F (36.6 C) (Oral)   Resp 18   Ht 6\' 1"  (1.854 m)   Wt 87.1 kg (192 lb)   SpO2 97%   BMI 25.33 kg/m   Physical Exam  Constitutional: He appears well-developed and well-nourished.  HENT:  Head: Normocephalic and atraumatic.  Right Ear: External ear normal.  Left Ear: External ear normal.  Eyes: Conjunctivae are normal. Right eye exhibits no discharge. Left eye exhibits no discharge. No scleral icterus.  Pulmonary/Chest: Effort  normal. No respiratory distress.  Abdominal: Soft. Bowel sounds are normal. He exhibits no distension and no mass. There is no tenderness. There is no guarding.  Genitourinary:  Genitourinary Comments: Chaperone was present.   Patient with what appears to be tissue like material that is prolapse ~5cm out of the anus. It appears compressed nearest to anus with large mound of tissue distally with possible stool that has collected. Please see picture below.   Neurological: He is alert.  Skin: No pallor.  Psychiatric: He has a normal mood and affect.  Nursing note and vitals reviewed.    ED Treatments / Results  Labs (all labs ordered are listed, but only abnormal results are displayed) Labs Reviewed - No data to display  EKG  EKG Interpretation None       Radiology No results found.  Procedures Procedures (including critical care time)  Medications Ordered in ED Medications - No data to display   Initial Impression / Assessment and Plan / ED Course  I have reviewed the triage vital signs and the nursing notes.  Pertinent labs & imaging results that were available during my care of the patient were reviewed by me and considered in my medical decision making (see chart for details).      52 y.o. male with a history of rectal adenocarcinoma status post robotic LAR resection on 10/10/2017 by Dr. gross who presents the emergency department today for valve prolapse.  Patient notes that after the surgery he was having approximately 8 soft bowel movements per day.  He saw his oncologist on 11/15/2017 where he was recommended to start Imodium. Yesterday he started a new medication as well called Xeloda.  Since that he has not felt constipated and unable to have a successful bowel movement.  This morning when he woke he had a urgency to have a bowel movement and was straining when he felt a tissue prolapse.  He has no associated pain with this.    Patients vitals signs are reassuring.  Exam as above witt tissue like area coming out of the anus with large area of possible stool at the distal end. Consult to general surgery placed. PA Will to see the patient.   After discussion with general surgery the thought is it is possiby loss of some rectal mucosa with new constipation.  The area was removed by general surgery in the department. He was observed in the emergency department without any repeat symptoms.  He has been able to have bowel movement since event and passing gas.  Recommendation is to discontinue Imodium and follow with Dr. Johney Maine. Specific return precautions discussed. Time was given for all questions to be answered. The patient verbalized understanding and agreement with plan. The patient appears safe for discharge home.  Final Clinical Impressions(s) / ED Diagnoses   Final diagnoses:  Bowel dysfunction  Constipation, unspecified constipation type    ED Discharge Orders    None       Jillyn Ledger, Hershal Coria 11/20/17 [place], [place], DO 11/21/17 1601

## 2017-11-20 NOTE — ED Notes (Signed)
Bed: WT88 Expected date:  Expected time:  Means of arrival:  Comments: EMS-patient from [facility]

## 2017-11-20 NOTE — Discharge Instructions (Signed)
Please discontinue Imodium. These call [facility] [place] [facility] and follow-up with Dr. gross as advised.  If you develop worsening or new concerning symptoms you can return to the emergency department for re-evaluation.

## 2017-11-20 NOTE — Progress Notes (Signed)
CC:  Tissue protruding from the rectum  Subjective: Patient is a 52 year old male with mid rectal cancer who underwent robotic assisted lower anterior resection with rigid proctoscopy on 10/10/17.  Postop he had some diarrhea and this improved with Imodium.  Yesterday he was started on Xeloda, which caused some constipation and was unable to have a bowel movement since he started that medicine yesterday.  He woke this morning with urgency to have a bowel movement and was straining.  He then felt some tissue prolapse from his rectum.  He is not having pain with it.  He denies fever chills nausea or emesis.  No abdominal pain. He went to the bathroom and reports something hanging out of his rectum.  We are asked to see.  Objective: Vital signs in last 24 hours: Temp:  [97.8 F (36.6 C)] 97.8 F (36.6 C) (01/29 0817) Pulse Rate:  [88] 88 (01/29 1023) Resp:  [18] 18 (01/29 1023) BP: (148-153)/(96-115) 153/96 (01/29 1023) SpO2:  [96 %-97 %] 97 % (01/29 1023) Weight:  [87.1 kg (192 lb)] 87.1 kg (192 lb) (01/29 0817)    Intake/Output from previous day: No intake/output data recorded. Intake/Output this shift: No intake/output data recorded.  General appearance: alert, cooperative, no distress and Very anxious Resp: clear to auscultation bilaterally Cardio: regular rate and rhythm, S1, S2 normal, no murmur, click, rub or gallop GI: soft, non-tender; bowel sounds normal; no masses,  no organomegaly He was having some cramping after another bowel movement in the ED.  Stool at this time was hard and like was described below.  No blood  Rectal exam: Tissue picture below was extending from the rectum.  Digital exam showed the area was nontender.  I could feel and into the yellow appearing tissue and this was gently pulled on and came out.  There is no bleeding no discomfort.  This was on the proximal rectum.  He had a hard stool balls in the distal rectum that I could palpate with the end of my  finger.  There is no bleeding or discomfort other than that from digital exam.  After this tissue was removed patient was able to go to the bathroom again had a little bit more of the hard stool passed.. No bleeding and no discomfort with this.  Lab Results:  No results for input(s): WBC, HGB, HCT, PLT in the last 72 hours.  BMET No results for input(s): NA, K, CL, CO2, GLUCOSE, BUN, CREATININE, CALCIUM in the last 72 hours. PT/INR No results for input(s): LABPROT, INR in the last 72 hours.  Recent Labs  Lab 11/15/17 0903  AST 22  ALT 32  ALKPHOS 81  BILITOT 0.4  PROT 7.1  ALBUMIN 3.9     Lipase  No results found for: LIPASE   Medications: Prior to Admission medications   Medication Sig Start Date End Date Taking? Authorizing Provider  calcium carbonate (OS-CAL - DOSED IN MG OF ELEMENTAL CALCIUM) 1250 (500 Ca) MG tablet Take 1 tablet by mouth daily as needed (for bones).   Yes [provider]  Calcium Citrate-Vitamin D (CALCIUM CITRATE+D3 PETITES PO) Take 1 tablet by mouth daily.   Yes [provider]  capecitabine (XELODA) 500 MG tablet Take 4 tablets (2,000 mg total) by mouth 2 (two) times daily after a meal. Take on days 1-14 and off days 15-21 every 21 days. 11/01/17  Yes Truitt Merle, MD  ferrous sulfate 325 (65 FE) MG EC tablet Take 1 tablet (325  mg total) by mouth daily. 11/15/17  Yes [name], NP  GARLIC PO Take 1 capsule by mouth daily.    Yes [provider]  Multiple Vitamin (MULTIVITAMIN WITH MINERALS) TABS tablet Take 1 tablet by mouth daily.   Yes [provider]  prochlorperazine (COMPAZINE) 10 MG tablet Take 1 tablet (10 mg total) by mouth every 6 (six) hours as needed for nausea or vomiting. 08/06/17  Yes [name], NP  amLODipine (NORVASC) 5 MG tablet TAKE 1 TABLET DAILY Patient not taking: Reported on 11/20/2017 03/12/17   Eulas Post, MD  cetirizine (ZYRTEC) 10 MG tablet Take 10 mg by mouth daily as needed for  allergies.    [provider]  naproxen (NAPROSYN) 500 MG tablet Take 1 tablet (500 mg total) by mouth every 12 (twelve) hours as needed for mild pain or moderate pain. Patient not taking: Reported on 11/20/2017 10/10/17   Michael Boston, MD  omeprazole (PRILOSEC) 40 MG capsule Take 40 mg by mouth daily as needed (heart burn).  09/09/12   Burchette, Alinda Sierras, MD  ondansetron (ZOFRAN) 8 MG tablet Take 1 tablet (8 mg total) by mouth every 8 (eight) hours as needed for nausea or vomiting. Patient not taking: Reported on 11/20/2017 07/19/17   Hayden Pedro, PA-C  traMADol (ULTRAM) 50 MG tablet Take 1-2 tablets (50-100 mg total) by mouth every 6 (six) hours as needed for moderate pain or severe pain. Patient not taking: Reported on 11/20/2017 10/10/17   Michael Boston, MD   PE:  WN WD black male in no distress, just anxious.     Digital exam shows: No bleeding this flat Yellowish colored flat like tissue.  It it palpable with in the proximal rectum and then stopped. I applied some gentle pressure and it came out.  He did not have any pain with it.  He had some hard round feeling stool balls just beyond this.  Since removing it he has had no pain or discomfort.  Reviewed with Dr. Hassell Done, Dr. Donne Hazel.  He may have sloughed some mucosa and this stool is attached to it.  We will let him go home and follow up with Dr. Johney Maine in the office   Assessment/Plan  Rectal cancer with LAR 10/10/17. Post op loose stools on Imodium Chemotherapy started second round of Xeloda started yesterday with new constipation  Plan:  Possible slough of some rectal mucosa with new constipation.    We will watch him for a short time and let him go home and follow up with Dr. Johney Maine.  I discussed this with the emergency department staff and they will discharge him home.  Discontinue use of the imodium for now.       LOS: 0 days    Paul [name] 11/20/2017 [phone]

## 2017-11-27 ENCOUNTER — Encounter: Payer: Self-pay | Admitting: Nurse Practitioner

## 2017-12-06 NOTE — Progress Notes (Signed)
[place]  [phone] [phone]  Clinic Follow Up Note   Patient Care Team: Eulas Post, MD as PCP - General (Family Medicine) Arta Silence, MD as Consulting Physician (Gastroenterology) Michael Boston, MD as Consulting Physician (General Surgery) Kyung Rudd, MD as Consulting Physician (Radiation Oncology) Truitt Merle, MD as Consulting Physician (Oncology)   Date of Service:  12/07/2017   CHIEF COMPLAINTS:  F/u rectal cancer  Oncology History   Cancer Staging Rectal adenocarcinoma [facility]) Staging form: Colon and Rectum, AJCC 8th Edition - Clinical stage from 07/06/2017: Stage IIIB (cT3, cN1, cM0) - Signed by [name], NP on 07/10/2017      Rectal adenocarcinoma s/p robotic LAR resection 10/10/2017   07/03/2017 Imaging    CT ABD PELVIS W CONTRAST IMPRESSION: Possible mass in the rectum compatible carcinoma. Endoscopy recommended for further evaluation. Otherwise negative.      07/03/2017 Tumor Marker    CEA 2.7      07/05/2017 Initial Biopsy    Rectum biopsy -invasive adenocarcinoma, poorly differentiated      07/05/2017 Procedure    Colonoscopy Per Dr. Paulita Fujita Findings: The perianal and digital rectal examinations were normal.  Internal hemorrhoids were found during retroflexion. The hemorrhoids were moderate. No additional abnormalities were found on retroflexion.  A fungating, sessile and ulcerated partially obstructing large mass was found in the recto-sigmoid colon.The mass was partially circumferential (involving two-thirds of the lumen circumference). Oozing was present. Area was successfully injected with 2 mL [place] ink for tattooing. This was biopsied with a cold forceps for histology.      07/05/2017 Initial Diagnosis    Rectal adenocarcinoma ([place])      07/06/2017 Imaging    Staging MRI ABD/PELVIS revealed clinical stage T3N1M0, IIIb       07/18/2017 - 08/24/2017 Radiation Therapy    Radaition with Dr  Lisbeth Renshaw     Radiation treatment dates:   07/18/2017 - 08/24/2017  Site/dose:   The rectum was treated to 45 Gy in 25 fractions of 1.8 Gy, followed by a 5.4 Gy boost in 3 fractions to yield a total dose of 50.4 Gy.  Narrative: The patient tolerated radiation treatment relatively well.   He had rectal bleeding with clots that was bright red after bowel movements. He reported loose stools that were painful. The skin to the radiation site was irritated, but no skin breakage was noted.       07/18/2017 Imaging    CT CHEST  IMPRESSION: Negative. No evidence of thoracic metastatic disease or other significant abnormality.       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  The Hereditary Gene Panel offered by [facility] includes sequencing and/or deletion duplication testing of the following 47 genes: APC, ATM, AXIN2, BARD1, BMPR1A, BRCA1, BRCA2, BRIP1, CDH1, CDK4, CDKN2A (p14ARF), CDKN2A (p16INK4a), CHEK2, CTNNA1, DICER1, EPCAM (Deletion/duplication testing only), GREM1 (promoter region deletion/duplication testing only), KIT, MEN1, MLH1, MSH2, MSH3, MSH6, MUTYH, NBN, NF1, NHTL1, PALB2, PDGFRA, PMS2, POLD1, POLE, PTEN, RAD50, RAD51C, RAD51D, SDHB, SDHC, SDHD, SMAD4, SMARCA4. STK11, TP53, TSC1, TSC2, and VHL.  The following genes were evaluated for sequence changes only: SDHA and HOXB13 c.251G>A variant only.  The report date is August 27, 2017.       10/10/2017 Surgery    XI ROBOTIC ASSISTED  LOWER ANTERIOR RESECTION AND RIGID PROCTOSCOPY bu Dr. Johney Maine and Dr. Dema Severin  10/10/17       10/10/2017 Pathology Results        Diagnosis 10/10/17  1. Colon, segmental resection for tumor, sigmoid SMALL FOCUS OF COLONIC GLANDS WITH HIGH GRADE DYSPLASIA POST NEOADJUVANT CHEMORADIATION THERAPY BIOPSY SITE WITH  CHRONIC INFLAMMATION NO RESIDULE INVASIVE CARCINOMA PRESENT TWENTY-TWO BENIGN LYMPH NODES (0/22) 2. Colon, resection margin (donut), final distal BENIGN COLONIC TISSUE      11/19/2017 -  Adjuvant Chemotherapy    adjuvant Xeloda 2000 mg BID      HISTORY OF PRESENTING ILLNESS:  Paul [name] 52 y.o. male is here because of newly diagnosed rectal cancer. He has a long standing history of loose stool and developed diarrhea within the last 4 weeks. He noticed 3 episodes in 1 day of blood on toilet paper which prompted him to go to the ER on 07/03/2017. A CT ABD and pelvis revealed a mass in the rectum compatible with carcinoma. Incidentally, he tested positive for shiga like toxin producing E coli while in the ED. He was discharged from ED with no new medication. Colonoscopy on 07/05/2017 per Dr. Paulita Fujita revealed a fungating, sessile and ulcerated partially obstructing large mass in the recto-sigmoid colon. The mass was partially circumferential involving two-thirds of the lumen circumference. Oozing was present. Biopsy on 07/05/17 confirmed invasive adenocarcinoma of the rectum. Staging MRI on 07/06/17 revealed extension through muscularis propria 1-59m and nodes within the mesorectum and sigmoid meso colon measuring 986m indicating cT3N1Mo, stage IIIb. He was referred to GI surgery Dr. GrJohney Maineseen 07/09/2017.  Today he presents to clinic with a friend, referred by Dr. GrJohney MaineHe feels well overall with good appetite, stable weight, and no fatigue. His diarrhea has resolved but reports 2-3 loose stools per day, associated with cramping. No nausea, vomiting, or abdominal pain.   CURRENT THERAPY: adjuvant Xeloda 2000 mg BID started on 11/19/17   INTERIM HISTORY:  Paul Maysoneteturns today for follow up post surgery. He presents to the clinic today by himself. He is continuing to recover from his resection surgery well.   He recently started Xeloda 2000 mg BID on 11/19/17 and reports that he had frequent  bowel movements during the first week. He states he was having 12 bowel movements a day. He started taking Imodium and now he reports 5-6 bowel movements with mild abdominal discomfort and a minor amount of blood in the stool. Pt was seen in the ED on 11/20/17 for bowel dysfunction. He appeared to have rectal tissue prolapse. The tissue was removed and he had no following symptoms. He reports he has been taking Xeloda in the morning with breakfast and with lunch 5-6 hours later.  He notes to have skin dryness on his hands and a taste change. He is able to eat. He has continued nausea but he is taking medication to improve this.   He reports he has gone back to work.    On review of systems, pt denies skin irritation on his feet, or any other complaints at this time. Pertinent positives are listed and detailed within the above HPI.   MEDICAL HISTORY:  Past Medical History:  Diagnosis Date  . Allergy    grass, dander  . Essential hypertension 07/03/2017  . Family history of prostate cancer   . GERD (gastroesophageal reflux disease)   . History of meningitis   . Hypertension   . 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 COLONOSCOPY WITH PROPOFOL Left 07/05/2017   Procedure: COLONOSCOPY WITH PROPOFOL;  Surgeon: Arta Silence, MD;  Location: [facility];  Service: Endoscopy;  Laterality: Left;  . PROCTOSCOPY N/A 10/10/2017   Procedure: RIGID PROCTOSCOPY;  Surgeon: Michael Boston, MD;  Location: [facility];  Service: General;  Laterality: N/A;  . XI ROBOTIC ASSISTED LOWER ANTERIOR RESECTION N/A 10/10/2017   Procedure: XI ROBOTIC ASSISTED LOWER ANTERIOR RESECTION;  Surgeon: Michael Boston, MD;  Location: [facility];  Service: General;  Laterality: N/A;   SOCIAL HISTORY: Social History   Socioeconomic History  . Marital status: Married     Spouse name: Not on file  . Number of children: Not on file  . Years of education: Not on file  . Highest education level: Not on file  Social Needs  . Financial resource strain: Not on file  . Food insecurity - worry: Not on file  . Food insecurity - inability: Not on file  . Transportation needs - medical: Not on file  . Transportation needs - non-medical: Not on file  Occupational History  . Not on file  Tobacco Use  . Smoking status: Never Smoker  . Smokeless tobacco: Never Used  Substance and Sexual Activity  . Alcohol use: No  . Drug use: No  . Sexual activity: Not on file  Other Topics Concern  . Not on file  Social History Narrative   Pronouced "[place]--[place]"   Originally from [place], [place], [place]   Patient is married with 3 children, ages 73, 12, and 21. He lives in a home with his wife and kids. 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 He works 3-11:30 pm shift; he has morning/daytime availability for appointments and treatments. He prefers to work during anticipated treatment but can apply for [employer]. He will send appropriate paperwork when the time comes.   FAMILY HISTORY: Family History  Problem Relation Age of Onset  . Hypertension Mother   . Asthma Father   . Cancer Father        Prostate  . Asthma Son   . Asthma Son   . Asthma Daughter   . Dementia Maternal Grandmother    ALLERGIES:  is allergic to chloroquine.  MEDICATIONS:  Current Outpatient Medications  Medication Sig Dispense Refill  . calcium carbonate (OS-CAL - DOSED IN MG OF ELEMENTAL CALCIUM) 1250 (500 Ca) MG tablet Take 1 tablet by mouth daily as needed (for bones).    . Calcium Citrate-Vitamin D (CALCIUM CITRATE+D3 PETITES PO) Take 1 tablet by mouth daily.    . capecitabine (XELODA) 500 MG tablet Take 4 tablets (2,000 mg total) by mouth 2 (two) times daily after a meal. Take on days  1-14 and off days 15-21 every 21 days. 112 tablet 3  . cetirizine (ZYRTEC) 10 MG tablet Take 10 mg by mouth daily as needed for allergies.    . ferrous sulfate 325 (65 FE) MG EC tablet Take 1 tablet (325 mg total) by mouth daily. 30 tablet 1  . GARLIC PO Take 1 capsule by mouth daily.     . Multiple Vitamin (MULTIVITAMIN WITH MINERALS) TABS tablet Take 1 tablet by mouth daily.    Marland Kitchen omeprazole (PRILOSEC) 40 MG capsule Take 40 mg by mouth daily as needed (heart burn).     Marland Kitchen  amLODipine (NORVASC) 5 MG tablet TAKE 1 TABLET DAILY (Patient not taking: Reported on 11/20/2017) 90 tablet 1  . naproxen (NAPROSYN) 500 MG tablet Take 1 tablet (500 mg total) by mouth every 12 (twelve) hours as needed for mild pain or moderate pain. (Patient not taking: Reported on 11/20/2017) 30 tablet 1  . ondansetron (ZOFRAN) 8 MG tablet Take 1 tablet (8 mg total) by mouth every 8 (eight) hours as needed for nausea or vomiting. (Patient not taking: Reported on 11/20/2017) 30 tablet 3  . prochlorperazine (COMPAZINE) 10 MG tablet Take 1 tablet (10 mg total) by mouth every 6 (six) hours as needed for nausea or vomiting. (Patient not taking: Reported on 12/07/2017) 30 tablet 2  . traMADol (ULTRAM) 50 MG tablet Take 1-2 tablets (50-100 mg total) by mouth every 6 (six) hours as needed for moderate pain or severe pain. (Patient not taking: Reported on 11/20/2017) 30 tablet 0   No current facility-administered medications for this visit.    REVIEW OF SYSTEMS:   Constitutional: Denies fatigue, fevers, chills, abnormal night sweats, or weight loss. Eyes: Denies blurriness of vision, double vision or watery eyes Ears, nose, mouth, throat, and face: Denies mucositis or sore throat Respiratory: Denies cough, dyspnea or wheezes Cardiovascular: Denies palpitation, chest discomfort or lower extremity swelling Gastrointestinal:  no nausea, vomiting, constipation, controlled (+) rectal pain due to radiation, managed with pain medication (+) mild  rectal bleeding (+) frequent bowel movements Urinary: (+) stop and start of urination flow Skin: Denies abnormal skin rashes on the feet (+) dryness of hands  Lymphatics: Denies new lymphadenopathy or easy bruising Neurological:Denies numbness, tingling or new weaknesses Behavioral/Psych: Mood is stable, no new changes  All other systems were reviewed with the patient and are negative.  PHYSICAL EXAMINATION:  ECOG PERFORMANCE STATUS: 1  Vitals:   12/07/17 0849  BP: (!) 143/87  Pulse: 73  Resp: 18  Temp: 98.5 F (36.9 C)  SpO2: 100%   Filed Weights   12/07/17 0849  Weight: 194 lb 6.4 oz (88.2 kg)    GENERAL:alert, no distress and comfortable SKIN: skin color, texture, turgor are normal, no rashes or significant lesions EYES: normal, conjunctiva are pink and non-injected, sclera clear OROPHARYNX:no exudate, no erythema and lips, buccal mucosa, and tongue normal  NECK: supple, thyroid normal size, non-tender, without nodularity LYMPH:  no palpable cervical or supraclavicular lymphadenopathy  LUNGS: clear to auscultation bilaterally with normal breathing effort HEART: regular rate & rhythm, S1 and S2 present; no murmurs and no lower extremity edema ABDOMEN:abdomen soft, flat, non-tender and normal bowel sounds. No palpable hepatosplenomegaly or masses. Low anterior incision and laparoscopic incisions have both healed very well.  MSK:no cyanosis of digits and no clubbing  PSYCH: alert & oriented x 3 with fluent speech NEURO: no focal motor/sensory deficits RECTAL: exam was deferred   LABORATORY DATA:  I have reviewed the data as listed CBC Latest Ref Rng & Units 12/07/2017 11/15/2017 10/25/2017  WBC 4.0 - 10.3 K/uL 2.8(L) 2.9(L) 3.4(L)  Hemoglobin 13.0 - 17.1 g/dL 12.4(L) 12.6(L) 12.7(L)  Hematocrit 38.4 - 49.9 % 37.9(L) 37.8(L) 38.0(L)  Platelets 140 - 400 K/uL 216 174 200   CMP Latest Ref Rng & Units 11/15/2017 10/25/2017 10/19/2017  Glucose 70 - 140 mg/dL 101 88 101(H)  BUN 7 -  26 mg/dL 8 11.3 10  Creatinine 0.70 - 1.30 mg/dL 0.83 0.9 0.85  Sodium 136 - 145 mmol/L 142 142 142  Potassium 3.5 - 5.1 mmol/L 3.8 4.4 4.2  Chloride  98 - 109 mmol/L 105 - 104  CO2 22 - 29 mmol/L 29 31(H) 31  Calcium 8.4 - 10.4 mg/dL 9.3 9.6 9.2  Total Protein 6.4 - 8.3 g/dL 7.1 7.3 7.3  Total Bilirubin 0.2 - 1.2 mg/dL 0.4 0.46 0.7  Alkaline Phos 40 - 150 U/L 81 80 62  AST 5 - 34 U/L 22 38(H) 20  ALT 0 - 55 U/L 32 43 23   PATHOLOGY RESULTS:  Diagnosis 10/10/17  1. Colon, segmental resection for tumor, sigmoid SMALL FOCUS OF COLONIC GLANDS WITH HIGH GRADE DYSPLASIA POST NEOADJUVANT CHEMORADIATION THERAPY BIOPSY SITE WITH CHRONIC INFLAMMATION NO RESIDULE INVASIVE CARCINOMA PRESENT TWENTY-TWO BENIGN LYMPH NODES (0/22) 2. Colon, resection margin (donut), final distal BENIGN COLONIC TISSUE Microscopic Comment 1. COLON AND RECTUM (INCLUDING TRANS-ANAL RESECTION): Specimen: Sigmoid Procedure: Segmental resection Tumor site: Posterior wall of the proximal rectum Specimen integrity: Intact Macroscopic intactness of mesorectum: Not applicable: X Complete: NA Near complete: NA Incomplete: NA Cannot be determined (specify): NA Macroscopic tumor perforation: No residual tumor identified Invasive tumor: Maximum size: No residual tumor Histologic type(s): adenocarcinoma (biopsy [id number]) Histologic grade and differentiation: G3 G1: well differentiated/low grade G2: moderately differentiated/low grade G3: poorly differentiated/high grade G4: undifferentiated/high grade 1 of 3 FINAL for TRAMAR, BRUECKNER [phone]) Microscopic Comment(continued) Type of polyp in which invasive carcinoma arose: Tubular adenoma Microscopic extension of invasive tumor: No residual invasive carcinoma Lymph-Vascular invasion: Negative Peri-neural invasion: Negative Tumor deposit(s) (discontinuous extramural extension): negative Resection margins: Proximal margin: Negative Distal margin:  Negative Circumferential (radial) (posterior ascending, posterior descending; lateral and posterior mid-rectum; and entire lower 1/3 rectum):Negative Mesenteric margin (sigmoid and transverse): negative Distance closest margin (if all above margins negative): NA Trans-anal resection margins only: Deep margin: NA Mucosal Margin: NA Distance closest mucosal margin (if negative): NA Treatment effect (neo-adjuvant therapy): Complete response Additional polyp(s): Negative Non-neoplastic findings: unremarkable Lymph nodes: number examined 22; number positive: 0 Pathologic Staging: ypTis, ypN0, ypMx Ancillary studies: per request    Diagnosis 07/05/17 Rectum, biopsy - INVASIVE ADENOCARCINOMA. - SEE COMMENT. Microscopic Comment The adenocarcinoma is poorly differentiated. Dr. Vicente Males has reviewed the case and concurs with this interpretation. Dr. Paulita Fujita was paged on 07/06/17. (JBK:gt, 07/06/17)  PROCEDURES  COLONOSCOPY: Dr. Paulita Fujita 07/05/2017 - Internal hemorrhoids. - Likely malignant partially obstructing tumor in the recto-sigmoid colon. Injected. Biopsied.   RADIOGRAPHIC STUDIES: I have personally reviewed the radiological images as listed and agreed with the findings in the report. No results found.  ASSESSMENT & PLAN:  Canden Cieslinski is a 52 y.o.  with history of allergies, HTN, GERD, and chronic low back pain. He presents with rectal cancer diagnosed 07/05/2017.  1. Poorly differentiated invasive rectal adenocarcinoma, cT3N1M0, stage IIIb, ypT0N0 -I previously reviewed his biopsy and CT, MRI scan results with the patient and his friend in detail.  -His tumor is located in the rectosigmoid colon. -We discussed he has likely locally advanced stage IIIb disease based on tumor size, invasion into the muscularis, and positive lymph node, no distant metastasis on day CT and MRI of abdomen and pelvis. -CT chest was negative for metastasis -He has seen Dr. Johney Maine on 07/09/17 who  recommends neoadjuvant chemoradiation to help shrink tumor followed by surgical low anterior resection with possible adjuvant chemotherapy after surgery. We are in agreement with this plan. -He underwent concurrent chemoRT with Xeloda on 07/18/17-08/24/17, with mild nausea and rectal bleeding and residual effects on his urine output flow and rectal pain.  -He underwent low anterior resection surgery on 10/10/17, his surgical  pathology showed complete response to to neoadjuvant chemoradiation therapy.  No residual tumor, or 22 lymph nodes were negative. -He started adjuvant 2000 mg Xeloda BID on 11/19/17, plan for 4-5 cycles  -He reports he has been taking Xeloda in the morning with breakfast and with lunch 4-5 hours later. I strongly advised him to take Xeloda 10-12 hours apart. He can do this with a snack around 6 PM if he eats breakfast at 7 AM -His CBC today is okay for him to continue with Cycle 2 of Xeloda, he is tolerating well overall  -F/u in 3 weeks before cycle 3   2. 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    3. Nausea and frequent bowel movement -He has been having frequent bowel movement since his surgery, still has loose bowel movement 3-5 times a day.  -Continue Imodium as needed, he takes once a day -Mild nausea, he takes antiemetics as needed. -Pt was seen in the ED on 11/20/17 for bowel dysfunction. He appeared to have rectal tissue prolapse. The tissue was removed and he had no following symptoms.  -I suggested that he eat more vegetables and a fiber rich diet    PLAN: -Start taking Xeloda 2000 BID cycle 2 at 7 am with breakfast and 6 pm with a snack, next Monday 2/18 -F/u in 3 weeks with lab    No orders of  the defined types were placed in this encounter.  All questions were answered. The patient knows to call the clinic with any problems, questions or concerns. I spent 20 minutes counseling the patient face to face. The total time spent in the appointment was 25 minutes.  This document serves as a record of services personally performed by Truitt Merle, MD. It was created on her behalf by Theresia Bough, a trained medical scribe. The creation of this record is based on the scribe's personal observations and the provider's statements to them.   I have reviewed the above documentation for accuracy and completeness, and I agree with the above.   Truitt Merle, MD 12/07/2017 9:20 AM

## 2017-12-07 ENCOUNTER — Inpatient Hospital Stay: Payer: BLUE CROSS/BLUE SHIELD

## 2017-12-07 ENCOUNTER — Encounter: Payer: Self-pay | Admitting: Hematology

## 2017-12-07 ENCOUNTER — Telehealth: Payer: Self-pay | Admitting: Hematology

## 2017-12-07 ENCOUNTER — Inpatient Hospital Stay: Payer: BLUE CROSS/BLUE SHIELD | Attending: Hematology | Admitting: Hematology

## 2017-12-07 VITALS — BP 143/87 | HR 73 | Temp 98.5°F | Resp 18 | Ht 73.0 in | Wt 194.4 lb

## 2017-12-07 DIAGNOSIS — C20 Malignant neoplasm of rectum: Secondary | ICD-10-CM | POA: Insufficient documentation

## 2017-12-07 DIAGNOSIS — R197 Diarrhea, unspecified: Secondary | ICD-10-CM

## 2017-12-07 DIAGNOSIS — I10 Essential (primary) hypertension: Secondary | ICD-10-CM | POA: Diagnosis not present

## 2017-12-07 DIAGNOSIS — D500 Iron deficiency anemia secondary to blood loss (chronic): Secondary | ICD-10-CM

## 2017-12-07 LAB — COMPREHENSIVE METABOLIC PANEL
ALBUMIN: 4 g/dL (ref 3.5–5.0)
ALT: 20 U/L (ref 0–55)
ANION GAP: 8 (ref 3–11)
AST: 20 U/L (ref 5–34)
Alkaline Phosphatase: 87 U/L (ref 40–150)
BUN: 14 mg/dL (ref 7–26)
CHLORIDE: 105 mmol/L (ref 98–109)
CO2: 28 mmol/L (ref 22–29)
Calcium: 9.7 mg/dL (ref 8.4–10.4)
Creatinine, Ser: 0.86 mg/dL (ref 0.70–1.30)
GFR calc Af Amer: >60 mL/min (ref >60)
GFR calc non Af Amer: >60 mL/min (ref >60)
GLUCOSE: 92 mg/dL (ref 70–140)
POTASSIUM: 4.6 mmol/L (ref 3.5–5.1)
SODIUM: 141 mmol/L (ref 136–145)
Total Bilirubin: 0.4 mg/dL (ref 0.2–1.2)
Total Protein: 7 g/dL (ref 6.4–8.3)

## 2017-12-07 LAB — CBC WITH DIFFERENTIAL/PLATELET
BASOS ABS: 0 10*3/uL (ref 0.0–0.1)
Basophils Relative: 1 %
Eosinophils Absolute: 0.1 10*3/uL (ref 0.0–0.5)
Eosinophils Relative: 5 %
HEMATOCRIT: 37.9 % — AB (ref 38.4–49.9)
Hemoglobin: 12.4 g/dL — ABNORMAL LOW (ref 13.0–17.1)
LYMPHS PCT: 19 %
Lymphs Abs: 0.5 10*3/uL — ABNORMAL LOW (ref 0.9–3.3)
MCH: 28.2 pg (ref 27.2–33.4)
MCHC: 32.8 g/dL (ref 32.0–36.0)
MCV: 85.9 fL (ref 79.3–98.0)
MONO ABS: 0.4 10*3/uL (ref 0.1–0.9)
MONOS PCT: 14 %
NEUTROS ABS: 1.7 10*3/uL (ref 1.5–6.5)
Neutrophils Relative %: 61 %
Platelets: 216 10*3/uL (ref 140–400)
RBC: 4.41 MIL/uL (ref 4.20–5.82)
RDW: 16.4 % — AB (ref 11.0–14.6)
WBC: 2.8 10*3/uL — ABNORMAL LOW (ref 4.0–10.3)

## 2017-12-07 LAB — FERRITIN: Ferritin: 7 ng/mL — ABNORMAL LOW (ref 22–316)

## 2017-12-07 LAB — IRON AND TIBC
Iron: 38 ug/dL — ABNORMAL LOW (ref 42–163)
SATURATION RATIOS: 10 % — AB (ref 42–163)
TIBC: 403 ug/dL (ref 202–409)
UIBC: 364 ug/dL

## 2017-12-07 NOTE — Telephone Encounter (Signed)
Scheduled appt per 2/15 los - Gave patient AVS and calender per los.  

## 2017-12-28 ENCOUNTER — Telehealth: Payer: Self-pay | Admitting: Hematology

## 2017-12-28 ENCOUNTER — Encounter: Payer: Self-pay | Admitting: Nurse Practitioner

## 2017-12-28 ENCOUNTER — Inpatient Hospital Stay: Payer: BLUE CROSS/BLUE SHIELD | Attending: Hematology | Admitting: Nurse Practitioner

## 2017-12-28 ENCOUNTER — Inpatient Hospital Stay: Payer: BLUE CROSS/BLUE SHIELD

## 2017-12-28 VITALS — BP 142/89 | HR 74 | Temp 98.3°F | Resp 20 | Ht 73.0 in | Wt 192.9 lb

## 2017-12-28 DIAGNOSIS — I10 Essential (primary) hypertension: Secondary | ICD-10-CM | POA: Insufficient documentation

## 2017-12-28 DIAGNOSIS — C20 Malignant neoplasm of rectum: Secondary | ICD-10-CM | POA: Insufficient documentation

## 2017-12-28 DIAGNOSIS — D509 Iron deficiency anemia, unspecified: Secondary | ICD-10-CM | POA: Insufficient documentation

## 2017-12-28 DIAGNOSIS — D500 Iron deficiency anemia secondary to blood loss (chronic): Secondary | ICD-10-CM

## 2017-12-28 DIAGNOSIS — R197 Diarrhea, unspecified: Secondary | ICD-10-CM | POA: Diagnosis not present

## 2017-12-28 DIAGNOSIS — Z923 Personal history of irradiation: Secondary | ICD-10-CM | POA: Insufficient documentation

## 2017-12-28 DIAGNOSIS — G8929 Other chronic pain: Secondary | ICD-10-CM | POA: Insufficient documentation

## 2017-12-28 LAB — COMPREHENSIVE METABOLIC PANEL
ALBUMIN: 3.9 g/dL (ref 3.5–5.0)
ALK PHOS: 93 U/L (ref 40–150)
ALT: 22 U/L (ref 0–55)
AST: 24 U/L (ref 5–34)
Anion gap: 7 (ref 3–11)
BILIRUBIN TOTAL: 0.5 mg/dL (ref 0.2–1.2)
BUN: 10 mg/dL (ref 7–26)
CO2: 29 mmol/L (ref 22–29)
Calcium: 9.4 mg/dL (ref 8.4–10.4)
Chloride: 105 mmol/L (ref 98–109)
Creatinine, Ser: 0.85 mg/dL (ref 0.70–1.30)
GFR calc Af Amer: >60 mL/min (ref >60)
GFR calc non Af Amer: >60 mL/min (ref >60)
Glucose, Bld: 111 mg/dL (ref 70–140)
POTASSIUM: 4.1 mmol/L (ref 3.5–5.1)
Sodium: 141 mmol/L (ref 136–145)
TOTAL PROTEIN: 7.1 g/dL (ref 6.4–8.3)

## 2017-12-28 LAB — CBC WITH DIFFERENTIAL/PLATELET
BASOS ABS: 0 10*3/uL (ref 0.0–0.1)
Basophils Relative: 1 %
Eosinophils Absolute: 0.2 10*3/uL (ref 0.0–0.5)
Eosinophils Relative: 6 %
HCT: 38 % — ABNORMAL LOW (ref 38.4–49.9)
Hemoglobin: 12.5 g/dL — ABNORMAL LOW (ref 13.0–17.1)
LYMPHS PCT: 19 %
Lymphs Abs: 0.5 10*3/uL — ABNORMAL LOW (ref 0.9–3.3)
MCH: 28 pg (ref 27.2–33.4)
MCHC: 33 g/dL (ref 32.0–36.0)
MCV: 84.8 fL (ref 79.3–98.0)
Monocytes Absolute: 0.4 10*3/uL (ref 0.1–0.9)
Monocytes Relative: 16 %
Neutro Abs: 1.5 10*3/uL (ref 1.5–6.5)
Neutrophils Relative %: 58 %
PLATELETS: 155 10*3/uL (ref 140–400)
RBC: 4.48 MIL/uL (ref 4.20–5.82)
RDW: 16.8 % — AB (ref 11.0–14.6)
WBC: 2.6 10*3/uL — ABNORMAL LOW (ref 4.0–10.3)

## 2017-12-28 MED ORDER — CAPECITABINE 500 MG PO TABS: 2000.0000 mg | ORAL_TABLET | Freq: Two times a day (BID) | ORAL | 2 refills | Status: DC

## 2017-12-28 NOTE — Telephone Encounter (Signed)
Patient declined AVs and calendar of upcoming march appointments.

## 2017-12-28 NOTE — Progress Notes (Signed)
[place]  [phone] [phone]) [phone]  Clinic Follow up Note   Patient Care Team: Eulas Post, MD as PCP - General (Family Medicine) Arta Silence, MD as Consulting Physician (Gastroenterology) Michael Boston, MD as Consulting Physician (General Surgery) Kyung Rudd, MD as Consulting Physician (Radiation Oncology) Truitt Merle, MD as Consulting Physician (Oncology) 12/28/2017  SUMMARY OF ONCOLOGIC HISTORY: Oncology History   Cancer Staging Rectal adenocarcinoma [facility]) Staging form: Colon and Rectum, AJCC 8th Edition - Clinical stage from 07/06/2017: Stage IIIB (cT3, cN1, cM0) - Signed by [name], NP on 07/10/2017      Rectal adenocarcinoma s/p robotic LAR resection 10/10/2017   07/03/2017 Imaging    CT ABD PELVIS W CONTRAST IMPRESSION: Possible mass in the rectum compatible carcinoma. Endoscopy recommended for further evaluation. Otherwise negative.      07/03/2017 Tumor Marker    CEA 2.7      07/05/2017 Initial Biopsy    Rectum biopsy -invasive adenocarcinoma, poorly differentiated      07/05/2017 Procedure    Colonoscopy Per Dr. Paulita Fujita Findings: The perianal and digital rectal examinations were normal.  Internal hemorrhoids were found during retroflexion. The hemorrhoids were moderate. No additional abnormalities were found on retroflexion.  A fungating, sessile and ulcerated partially obstructing large mass was found in the recto-sigmoid colon.The mass was partially circumferential (involving two-thirds of the lumen circumference). Oozing was present. Area was successfully injected with 2 mL [place] ink for tattooing. This was biopsied with a cold forceps for histology.      07/05/2017 Initial Diagnosis    Rectal adenocarcinoma ([place])      07/06/2017 Imaging    Staging MRI ABD/PELVIS revealed clinical stage T3N1M0, IIIb       07/18/2017 - 08/24/2017 Radiation Therapy    Radaition with Dr Lisbeth Renshaw     Radiation treatment  dates:   07/18/2017 - 08/24/2017  Site/dose:   The rectum was treated to 45 Gy in 25 fractions of 1.8 Gy, followed by a 5.4 Gy boost in 3 fractions to yield a total dose of 50.4 Gy.  Narrative: The patient tolerated radiation treatment relatively well.   He had rectal bleeding with clots that was bright red after bowel movements. He reported loose stools that were painful. The skin to the radiation site was irritated, but no skin breakage was noted.       07/18/2017 Imaging    CT CHEST  IMPRESSION: Negative. No evidence of thoracic metastatic disease or other significant abnormality.       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  The Hereditary Gene Panel offered by [facility] includes sequencing and/or deletion duplication testing of the following 47 genes: APC, ATM, AXIN2, BARD1, BMPR1A, BRCA1, BRCA2, BRIP1, CDH1, CDK4, CDKN2A (p14ARF), CDKN2A (p16INK4a), CHEK2, CTNNA1, DICER1, EPCAM (Deletion/duplication testing only), GREM1 (promoter region deletion/duplication testing only), KIT, MEN1, MLH1, MSH2, MSH3, MSH6, MUTYH, NBN, NF1, NHTL1, PALB2, PDGFRA, PMS2, POLD1, POLE, PTEN, RAD50, RAD51C, RAD51D, SDHB, SDHC, SDHD, SMAD4, SMARCA4. STK11, TP53, TSC1, TSC2, and VHL.  The following genes were evaluated for sequence changes only: SDHA and HOXB13 c.251G>A variant only.  The report date is August 27, 2017.       10/10/2017 Surgery    XI ROBOTIC ASSISTED LOWER ANTERIOR RESECTION AND RIGID PROCTOSCOPY bu Dr. GrJohney Mainend  Dr. Dema Severin  10/10/17       10/10/2017 Pathology Results        Diagnosis 10/10/17  1. Colon, segmental resection for tumor, sigmoid SMALL FOCUS OF COLONIC GLANDS WITH HIGH GRADE DYSPLASIA POST NEOADJUVANT CHEMORADIATION THERAPY BIOPSY SITE WITH CHRONIC INFLAMMATION NO RESIDULE  INVASIVE CARCINOMA PRESENT TWENTY-TWO BENIGN LYMPH NODES (0/22) 2. Colon, resection margin (donut), final distal BENIGN COLONIC TISSUE      11/19/2017 -  Adjuvant Chemotherapy    adjuvant Xeloda 2000 mg BID     CURRENT THERAPY: adjuvant Xeloda 2000 mg BID started on 11/19/17  INTERVAL HISTORY: Mr. Dibari returns for follow-up as scheduled to begin cycle 3 adjuvant Xeloda.  Week off therapy began 12/23/17.  While on chemo he experiences decreased appetite and taste as well as mild weakness and tiredness.  No mucositis.  He remains able to work normally.  Symptoms improve while off treatment. Rarely has nausea, takes either Zofran or Compazine he is not sure; but it works.  Continues to report loose to watery stool, approximately 4 episodes per day, which is significantly improved with Imodium.  He takes 1/day.  No blood in stool.  He has occasional abdominal cramp before bowel movement but otherwise denies pain.  Hands are dry and dark, occasionally sensitive while on Xeloda but not limiting function or strength.  No numbness or tingling.  REVIEW OF SYSTEMS:   Constitutional: Denies fevers, chills or abnormal weight loss (+) mild fatigue and weakness on Xeloda, improves on week off xeloda Eyes: Denies blurriness of vision Ears, nose, mouth, throat, and face: Denies mucositis or sore throat Respiratory: Denies cough, dyspnea or wheezes Cardiovascular: Denies palpitation, chest discomfort or lower extremity swelling Gastrointestinal:  Denies any, constipation, heartburn, hematochezia, or change in bowel habits (+) infrequent nausea (+) loose to watery stool, approximately 4/day, significantly improved with Imodium Skin: Denies abnormal skin rashes (+) dryness and darkening to palms, occasional sensitivity on Xeloda Lymphatics: Denies new lymphadenopathy or easy bruising Neurological:Denies numbness, tingling or new weaknesses  Behavioral/Psych: Mood is stable, no new changes  All other systems  were reviewed with the patient and are negative.  MEDICAL HISTORY:  Past Medical History:  Diagnosis Date  . Allergy    grass, dander  . Essential hypertension 07/03/2017  . Family history of prostate cancer   . GERD (gastroesophageal reflux disease)   . History of meningitis   . Hypertension   . 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 COLONOSCOPY WITH PROPOFOL Left 07/05/2017   Procedure: COLONOSCOPY WITH PROPOFOL;  Surgeon: Arta Silence, MD;  Location: [facility];  Service: Endoscopy;  Laterality: Left;  . PROCTOSCOPY N/A 10/10/2017   Procedure: RIGID PROCTOSCOPY;  Surgeon: Michael Boston, MD;  Location: [facility];  Service: General;  Laterality: N/A;  . XI ROBOTIC ASSISTED LOWER ANTERIOR RESECTION N/A 10/10/2017   Procedure: XI ROBOTIC ASSISTED LOWER ANTERIOR RESECTION;  Surgeon: Michael Boston, MD;  Location: [facility];  Service: General;  Laterality: N/A;    I have reviewed the social history and family history with the patient and they are unchanged from previous note.  ALLERGIES:  is allergic to chloroquine.  MEDICATIONS:  Current Outpatient Medications  Medication Sig Dispense Refill  . amLODipine (NORVASC) 5 MG tablet TAKE 1 TABLET DAILY 90 tablet 1  . cetirizine (ZYRTEC) 10 MG tablet Take 10 mg by mouth daily as needed  for allergies.    . ferrous sulfate 325 (65 FE) MG EC tablet Take 1 tablet (325 mg total) by mouth daily. 30 tablet 1  . GARLIC PO Take 1 capsule by mouth daily.     . omeprazole (PRILOSEC) 40 MG capsule Take 40 mg by mouth daily as needed (heart burn).     . ondansetron (ZOFRAN) 8 MG tablet Take 1 tablet (8 mg total) by mouth every 8 (eight) hours as needed for nausea or vomiting. 30 tablet 3  . prochlorperazine (COMPAZINE) 10 MG tablet Take 1 tablet (10 mg total) by mouth every 6 (six) hours as  needed for nausea or vomiting. 30 tablet 2  . traMADol (ULTRAM) 50 MG tablet Take 1-2 tablets (50-100 mg total) by mouth every 6 (six) hours as needed for moderate pain or severe pain. 30 tablet 0  . calcium carbonate (OS-CAL - DOSED IN MG OF ELEMENTAL CALCIUM) 1250 (500 Ca) MG tablet Take 1 tablet by mouth daily as needed (for bones).    . Calcium Citrate-Vitamin D (CALCIUM CITRATE+D3 PETITES PO) Take 1 tablet by mouth daily.    . capecitabine (XELODA) 500 MG tablet Take 4 tablets (2,000 mg total) by mouth 2 (two) times daily after a meal. Take on days 1-14 and off days 15-21 every 21 days. 112 tablet 2  . Multiple Vitamin (MULTIVITAMIN WITH MINERALS) TABS tablet Take 1 tablet by mouth daily.    . naproxen (NAPROSYN) 500 MG tablet Take 1 tablet (500 mg total) by mouth every 12 (twelve) hours as needed for mild pain or moderate pain. 30 tablet 1   No current facility-administered medications for this visit.     PHYSICAL EXAMINATION: ECOG PERFORMANCE STATUS: 1 - Symptomatic but completely ambulatory  Vitals:   12/28/17 0938  BP: (!) 142/89  Pulse: 74  Resp: 20  Temp: 98.3 F (36.8 C)  SpO2: 100%   Filed Weights   12/28/17 0938  Weight: 192 lb 14.4 oz (87.5 kg)    GENERAL:alert, no distress and comfortable SKIN: skin color, texture, turgor are normal, no rashes or significant lesions EYES: normal, Conjunctiva are pink and non-injected, sclera clear OROPHARYNX:no exudate, no erythema and lips, buccal mucosa, and tongue normal  LYMPH:  no palpable cervical, supraclavicular, axillary, or inguinal lymphadenopathy  LUNGS: clear to auscultation bilaterally with normal breathing effort HEART: regular rate & rhythm and no murmurs and no lower extremity edema ABDOMEN:abdomen soft, non-tender and normal bowel sounds Musculoskeletal:no cyanosis of digits and no clubbing. (+) dry hands (+) palmer hyperpigmentation NEURO: alert & oriented x 3 with fluent speech, no focal motor/sensory  deficits RECTAL: external exam reveals intact skin healed well s/p radiation. Internal exam reveals normal sphincter tone, no palpable mass, and no blood.   LABORATORY DATA:  I have reviewed the data as listed CBC Latest Ref Rng & Units 12/28/2017 12/07/2017 11/15/2017  WBC 4.0 - 10.3 K/uL 2.6(L) 2.8(L) 2.9(L)  Hemoglobin 13.0 - 17.1 g/dL 12.5(L) 12.4(L) 12.6(L)  Hematocrit 38.4 - 49.9 % 38.0(L) 37.9(L) 37.8(L)  Platelets 140 - 400 K/uL 155 216 174     CMP Latest Ref Rng & Units 12/28/2017 12/07/2017 11/15/2017  Glucose 70 - 140 mg/dL 111 92 101  BUN 7 - 26 mg/dL 10 14 8  Creatinine 0.70 - 1.30 mg/dL 0.85 0.86 0.83  Sodium 136 - 145 mmol/L 141 141 142  Potassium 3.5 - 5.1 mmol/L 4.1 4.6 3.8  Chloride 98 - 109 mmol/L 105 105 105  CO2 22 - 29 mmol/L   _0 Calcium 8.4 - 10.4 mg/dL 9.4 9.7 9.3  Total Protein 6.4 - 8.3 g/dL 7.1 7.0 7.1  Total Bilirubin 0.2 - 1.2 mg/dL 0.5 0.4 0.4  Alkaline Phos 40 - 150 U/L 93 87 81  AST 5 - 34 U/L _1 ALT 0 - 55 U/L 22 20 32    PATHOLOGY RESULTS:  Diagnosis 10/10/17  1. Colon, segmental resection for tumor, sigmoid SMALL FOCUS OF COLONIC GLANDS WITH HIGH GRADE DYSPLASIA POST NEOADJUVANT CHEMORADIATION THERAPY BIOPSY SITE WITH CHRONIC INFLAMMATION NO RESIDULE INVASIVE CARCINOMA PRESENT TWENTY-TWO BENIGN LYMPH NODES (0/22) 2. Colon, resection margin (donut), final distal BENIGN COLONIC TISSUE Microscopic Comment 1. COLON AND RECTUM (INCLUDING TRANS-ANAL RESECTION): Specimen: Sigmoid Procedure: Segmental resection Tumor site: Posterior wall of the proximal rectum Specimen integrity: Intact Macroscopic intactness of mesorectum: Not applicable: X Complete: NA Near complete: NA Incomplete: NA Cannot be determined (specify): NA Macroscopic tumor perforation: No residual tumor identified Invasive tumor: Maximum size: No residual tumor Histologic type(s): adenocarcinoma (biopsy [id number]) Histologic grade and differentiation: G3 G1:  well differentiated/low grade G2: moderately differentiated/low grade G3: poorly differentiated/high grade G4: undifferentiated/high grade 1 of 3 FINAL for [place]O, DEVEREUX [phone]) Microscopic Comment(continued) Type of polyp in which invasive carcinoma arose: Tubular adenoma Microscopic extension of invasive tumor: No residual invasive carcinoma Lymph-Vascular invasion: Negative Peri-neural invasion: Negative Tumor deposit(s) (discontinuous extramural extension): negative Resection margins: Proximal margin: Negative Distal margin: Negative Circumferential (radial) (posterior ascending, posterior descending; lateral and posterior mid-rectum; and entire lower 1/3 rectum):Negative Mesenteric margin (sigmoid and transverse): negative Distance closest margin (if all above margins negative): NA Trans-anal resection margins only: Deep margin: NA Mucosal Margin: NA Distance closest mucosal margin (if negative): NA Treatment effect (neo-adjuvant therapy): Complete response Additional polyp(s): Negative Non-neoplastic findings: unremarkable Lymph nodes: number examined 22; number positive: 0 Pathologic Staging: ypTis, ypN0, ypMx Ancillary studies: per request   Diagnosis 07/16/2017 Rectum, biopsy - INVASIVE ADENOCARCINOMA. - SEE COMMENT. Microscopic Comment The adenocarcinoma is poorly differentiated. Dr. Vicente Males has reviewed the case and concurs with this interpretation. Dr. Paulita Fujita was paged on 07/06/17. (JBK:gt, 07/06/17)   RADIOGRAPHIC STUDIES: I have personally reviewed the radiological images as listed and agreed with the findings in the report. No results found.   ASSESSMENT & PLAN: Earnie Bechard is a 52 y.o.  with history of allergies, HTN, GERD, and chronic low back pain. He presents with rectal cancer diagnosed July 16, 2017.  1. Poorly differentiated invasive rectal adenocarcinoma, cT3N1M0, stage IIIb, ypT0N0 -Mr. Reierson appears stable. He completed 2 cycles  adjuvant Xeloda. He is tolerating well overall with mild nausea, diarrhea, fatigue, and palmer hyperpigmentation. Weight stable. BP elevated, he does not take amlodipine every day; I encouraged him to take it regularly. Labs reviewed; CMP normal, CBC with mild leukopenia and anemia; adequate for treatment. Physical exam unremarkable. He will begin cycle 3 Xeloda 2000 mg BID x14 days on and 7 days off on 3/11; I refilled today. Plan is for 4-5 cycles. We briefly reviewed surveillance plan and signs of recurrence. F/u in 3 weeks prior to cycle 4.    2. Anemia; iron deficiency  -He has mild anemia, Hgb 12.5; Iron studies 12/07/17 reveal ferritin 7, serum iron 38, saturation ratio 10%, slightly worse from January. He had rectal bleeding that recently stopped. On oral iron 1 tablet daily. I recommend he increase to BID and take with vitamin C. Will check iron studies periodically and consider IV Feraheme if he does not respond to oral iron therapy.  3. 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 Nausea and frequent bowel movements  -Improved with anti-emetic and imodium. Encouraged him to hydrate adequately during times of increased stool to prevent dehydration. Informed him he can increase imodium PRN with increased stool frequency.  PLAN: -Labs reviewed, proceed with cycle 3 adjuvant Xeloda 2000 mg BID x14 days on/7 days off, start on 3/11  -increase Iron to 1 tab BID -refilled Xeloda to [facility]  -Return in 3 weeks for f/u and cycle 4   All questions were answered. The patient knows to call the clinic with any problems, questions or concerns. No barriers to learning was detected. I spent 20 minutes counseling the patient face to face. The total time spent in the appointment was 25 minutes and more than 50% was on counseling and review of test results     [name], NP 12/28/17

## 2017-12-31 ENCOUNTER — Telehealth: Payer: Self-pay

## 2017-12-31 NOTE — Telephone Encounter (Signed)
Informed pt, Per Lacie, NP, recent iron studies are low likely due to recent blood in stool and to increase iron to 1tab BID and take with vit C (orange juice) for better absorption. Pt voiced understanding.

## 2018-01-16 NOTE — Progress Notes (Signed)
Paul [name]  [phone] [phone]  Clinic Follow Up Note   Patient Care Team: Paul Post, MD as PCP - General (Family Medicine) Paul Silence, MD as Consulting Physician (Gastroenterology) Paul Boston, MD as Consulting Physician (General Surgery) Paul Rudd, MD as Consulting Physician (Radiation Oncology) Paul Merle, MD as Consulting Physician (Oncology)   Date of Service:  01/18/2018   CHIEF COMPLAINTS:  F/u rectal cancer  Oncology History   Cancer Staging Rectal adenocarcinoma [facility]) Staging form: Colon and Rectum, AJCC 8th Edition - Clinical stage from 07/06/2017: Stage IIIB (cT3, cN1, cM0) - Signed by Paul Feeling, NP on 07/10/2017      Rectal adenocarcinoma s/p robotic LAR resection 10/10/2017   07/03/2017 Imaging    CT ABD PELVIS W CONTRAST IMPRESSION: Possible mass in the rectum compatible carcinoma. Endoscopy recommended for further evaluation. Otherwise negative.      07/03/2017 Tumor Marker    CEA 2.7      07/05/2017 Initial Biopsy    Rectum biopsy -invasive adenocarcinoma, poorly differentiated      07/05/2017 Procedure    Colonoscopy Per Dr. Paulita [name] Findings: The perianal and digital rectal examinations were normal.  Internal hemorrhoids were found during retroflexion. The hemorrhoids were moderate. No additional abnormalities were found on retroflexion.  A fungating, sessile and ulcerated partially obstructing large mass was found in the recto-sigmoid colon.The mass was partially circumferential (involving two-thirds of the lumen circumference). Oozing was present. Area was successfully injected with 2 mL [place] ink for tattooing. This was biopsied with a cold forceps for histology.      07/05/2017 Initial Diagnosis    Rectal adenocarcinoma (Paul [name])      07/06/2017 Imaging    Staging MRI ABD/PELVIS revealed clinical stage T3N1M0, IIIb       07/18/2017 - 08/24/2017 Radiation Therapy    Paul [name] with Dr  Paul [name]     Radiation treatment dates:   07/18/2017 - 08/24/2017  Site/dose:   The rectum was treated to 45 Gy in 25 fractions of 1.8 Gy, followed by a 5.4 Gy boost in 3 fractions to yield a total dose of 50.4 Gy.  Narrative: The patient tolerated radiation treatment relatively well.   He had rectal bleeding with clots that was bright red after bowel movements. He reported loose stools that were painful. The skin to the radiation site was irritated, but no skin breakage was noted.       07/18/2017 Imaging    CT CHEST  IMPRESSION: Negative. No evidence of thoracic metastatic disease or other significant abnormality.       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  The Hereditary Gene Panel offered by [facility] includes sequencing and/or deletion duplication testing of the following 47 genes: APC, ATM, AXIN2, BARD1, BMPR1A, BRCA1, BRCA2, BRIP1, CDH1, CDK4, CDKN2A (p14ARF), CDKN2A (p16INK4a), CHEK2, CTNNA1, DICER1, EPCAM (Deletion/duplication testing only), GREM1 (promoter region deletion/duplication testing only), KIT, MEN1, MLH1, MSH2, MSH3, MSH6, MUTYH, NBN, NF1, NHTL1, PALB2, PDGFRA, PMS2, POLD1, POLE, PTEN, RAD50, RAD51C, RAD51D, SDHB, SDHC, SDHD, SMAD4, SMARCA4. STK11, TP53, TSC1, TSC2, and VHL.  The following genes were evaluated for sequence changes only: SDHA and HOXB13 c.251G>A variant only.  The report date is August 27, 2017.       10/10/2017 Surgery    XI ROBOTIC ASSISTED  LOWER ANTERIOR RESECTION AND RIGID PROCTOSCOPY bu Dr. Johney [name] and Dr. Dema [name]  10/10/17       10/10/2017 Pathology Results        Diagnosis 10/10/17  1. Colon, segmental resection for tumor, sigmoid SMALL FOCUS OF COLONIC GLANDS WITH HIGH GRADE DYSPLASIA [name] NEOADJUVANT CHEMORADIATION THERAPY BIOPSY SITE WITH  CHRONIC INFLAMMATION NO RESIDULE INVASIVE CARCINOMA PRESENT TWENTY-TWO BENIGN LYMPH NODES (0/22) 2. Colon, resection margin (donut), final distal BENIGN COLONIC TISSUE      11/19/2017 -  Adjuvant Chemotherapy    adjuvant Xeloda 2000 mg BID      HISTORY OF PRESENTING ILLNESS:  Paul [name] 52 y.o. male is here because of newly diagnosed rectal cancer. He has a long standing history of loose stool and developed diarrhea within the last 4 weeks. He noticed 3 episodes in 1 day of blood on toilet paper which prompted him to go to the ER on 07/03/2017. A CT ABD and pelvis revealed a mass in the rectum compatible with carcinoma. Incidentally, he tested positive for shiga like toxin producing E coli while in the ED. He was discharged from ED with no new medication. Colonoscopy on 07/05/2017 per Dr. Paulita [name] revealed a fungating, sessile and ulcerated partially obstructing large mass in the recto-sigmoid colon. The mass was partially circumferential involving two-thirds of the lumen circumference. Oozing was present. Biopsy on 07/05/17 confirmed invasive adenocarcinoma of the rectum. Staging MRI on 07/06/17 revealed extension through muscularis propria 1-72m and nodes within the mesorectum and sigmoid meso colon measuring 965m indicating cT3N1Mo, stage IIIb. He was referred to GI surgery Dr. GrJohney Maineseen 07/09/2017.  Today he presents to clinic with a friend, referred by Dr. GrJohney MaineHe feels well overall with good appetite, stable weight, and no fatigue. His diarrhea has resolved but reports 2-3 loose stools per day, associated with cramping. No nausea, vomiting, or abdominal pain.   CURRENT THERAPY: adjuvant Xeloda 2000 mg BID started on 11/19/17  INTERIM HISTORY:  MaNevin Grizzleeturns today for follow up. He presents to the clinic today by himself. He reports he is doing okay overall. He notes he was experiencing constipation and he was taking stool softener and he tried Miralax, then experienced loose  watery stool and abdominal cramps. This was also his off week. He does report that his appetite returns during the off week.   He is compliant with Xeloda and is taking 2000 mg BID 10-12 hours apart  On review of systems, pt denies blood in stool, or any other complaints at this time. Pertinent positives are listed and detailed within the above HPI.   MEDICAL HISTORY:  Past Medical History:  Diagnosis Date  . Allergy    grass, dander  . Essential hypertension 07/03/2017  . Family history of prostate cancer   . GERD (gastroesophageal reflux disease)   . History of meningitis   . Hypertension   . 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 COLONOSCOPY WITH PROPOFOL Left 07/05/2017   Procedure: COLONOSCOPY WITH PROPOFOL;  Surgeon: OuArta SilenceMD;  [place] [facility] Service: Endoscopy;  Laterality: Left;  . PROCTOSCOPY N/A 10/10/2017   Procedure: RIGID PROCTOSCOPY;  Surgeon: GrMichael BostonMD;  Location: [facility];  Service: General;  Laterality: N/A;  . XI ROBOTIC ASSISTED LOWER ANTERIOR RESECTION N/A 10/10/2017   Procedure: XI ROBOTIC ASSISTED LOWER ANTERIOR RESECTION;  Surgeon: GrMichael BostonMD;  Location:  [facility];  Service: General;  Laterality: N/A;   SOCIAL HISTORY: Social History   Socioeconomic History  . Marital status: Married    Spouse name: Not on file  . Number of children: Not on file  . Years of education: Not on file  . Highest education level: Not on file  Occupational History  . Not on file  Social Needs  . Financial resource strain: Not on file  . Food insecurity:    Worry: Not on file    Inability: Not on file  . Transportation needs:    Medical: Not on file    Non-medical: Not on file  Tobacco Use  . Smoking status: Never Smoker  . Smokeless tobacco: Never Used  Substance and Sexual  Activity  . Alcohol use: No  . Drug use: No  . Sexual activity: Not on file  Lifestyle  . Physical activity:    Days per week: Not on file    Minutes per session: Not on file  . Stress: Not on file  Relationships  . Social connections:    Talks on phone: Not on file    Gets together: Not on file    Attends religious service: Not on file    Active member of club or organization: Not on file    Attends meetings of clubs or organizations: Not on file    Relationship status: Not on file  . Intimate partner violence:    Fear of current or ex partner: Not on file    Emotionally abused: Not on file    Physically abused: Not on file    Forced sexual activity: Not on file  Other Topics Concern  . Not on file  Social History Narrative   Pronouced "KA-noo--[place]Y"   Originally from [place], [place], [place]   Patient is married with 3 children, ages 67, 29, and 18. He lives in a home with his wife and kids. 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 He works 3-11:30 pm shift; he has morning/daytime availability for appointments and treatments. He prefers to work during anticipated treatment but can apply for [employer]. He will send appropriate paperwork when the time comes.   FAMILY HISTORY: Family History  Problem Relation Age of Onset  . Hypertension Mother   . Asthma Father   . Cancer Father        Prostate  . Asthma Son   . Asthma Son   . Asthma Daughter   . Dementia Maternal Grandmother    ALLERGIES:  is allergic to chloroquine.  MEDICATIONS:  Current Outpatient Medications  Medication Sig Dispense Refill  . amLODipine (NORVASC) 5 MG tablet TAKE 1 TABLET DAILY 90 tablet 1  . calcium carbonate (OS-CAL - DOSED IN MG OF ELEMENTAL CALCIUM) 1250 (500 Ca) MG tablet Take 1 tablet by mouth daily as needed (for bones).    . Calcium Citrate-Vitamin D (CALCIUM CITRATE+D3 PETITES PO) Take  1 tablet by mouth daily.    . cetirizine (ZYRTEC) 10 MG tablet Take 10 mg by mouth daily as needed for allergies.    . ferrous sulfate 325 (65 FE) MG EC tablet Take 1 tablet (325 mg total) by mouth daily. 30 tablet 1  . GARLIC PO Take 1 capsule by mouth daily.     . Multiple Vitamin (MULTIVITAMIN WITH MINERALS) TABS tablet Take 1 tablet by mouth daily.    Marland Kitchen  naproxen (NAPROSYN) 500 MG tablet Take 1 tablet (500 mg total) by mouth every 12 (twelve) hours as needed for mild pain or moderate pain. 30 tablet 1  . omeprazole (PRILOSEC) 40 MG capsule Take 40 mg by mouth daily as needed (heart burn).     . ondansetron (ZOFRAN) 8 MG tablet Take 1 tablet (8 mg total) by mouth every 8 (eight) hours as needed for nausea or vomiting. 30 tablet 3  . prochlorperazine (COMPAZINE) 10 MG tablet Take 1 tablet (10 mg total) by mouth every 6 (six) hours as needed for nausea or vomiting. 30 tablet 2  . traMADol (ULTRAM) 50 MG tablet Take 1-2 tablets (50-100 mg total) by mouth every 6 (six) hours as needed for moderate pain or severe pain. 30 tablet 0  . capecitabine (XELODA) 500 MG tablet Take 4 tablets (2,000 mg total) by mouth 2 (two) times daily after a meal. Take on days 1-14 and off days 15-21 every 21 days. (Patient not taking: Reported on 01/18/2018) 112 tablet 2   No current facility-administered medications for this visit.    REVIEW OF SYSTEMS:   Constitutional: Denies fatigue, fevers, chills, abnormal night sweats, or weight loss. Eyes: Denies blurriness of vision, double vision or watery eyes Ears, nose, mouth, throat, and face: Denies mucositis or sore throat Respiratory: Denies cough, dyspnea or wheezes Cardiovascular: Denies palpitation, chest discomfort or lower extremity swelling Gastrointestinal:  no nausea, vomiting, constipation, controlled (+) rectal pain due to radiation, managed with pain medication (+) mild rectal bleeding (+) frequent bowel movements (+) gas (+) diarrhea (+)  constipation Urinary: (+) stop and start of urination flow Skin: Denies abnormal skin rashes on the feet (+) dryness of hands  Lymphatics: Denies new lymphadenopathy or easy bruising Neurological:Denies numbness, tingling or new weaknesses Behavioral/Psych: Mood is stable, no new changes  All other systems were reviewed with the patient and are negative.  PHYSICAL EXAMINATION:  ECOG PERFORMANCE STATUS: 1  Vitals:   01/18/18 0938  BP: (!) 143/92  Pulse: 80  Resp: 18  Temp: 98 F (36.7 C)  SpO2: 100%   Filed Weights   01/18/18 0938  Weight: 191 lb 1.6 oz (86.7 kg)    GENERAL:alert, no distress and comfortable SKIN: skin color, texture, turgor are normal, no rashes or significant lesions EYES: normal, conjunctiva are pink and non-injected, sclera clear OROPHARYNX:no exudate, no erythema and lips, buccal mucosa, and tongue normal  NECK: supple, thyroid normal size, non-tender, without nodularity LYMPH:  no palpable cervical or supraclavicular lymphadenopathy  LUNGS: clear to auscultation bilaterally with normal breathing effort HEART: regular rate & rhythm, S1 and S2 present; no murmurs and no lower extremity edema ABDOMEN:abdomen soft, flat, non-tender and normal bowel sounds. No palpable hepatosplenomegaly or masses. Low anterior incision and laparoscopic incisions have both healed very well.  MSK:no cyanosis of digits and no clubbing  PSYCH: alert & oriented x 3 with fluent speech NEURO: no focal motor/sensory deficits RECTAL: exam was deferred   LABORATORY DATA:  I have reviewed the data as listed CBC Latest Ref Rng & Units 01/18/2018 12/28/2017 12/07/2017  WBC 4.0 - 10.3 K/uL 3.1(L) 2.6(L) 2.8(L)  Hemoglobin 13.0 - 17.1 g/dL 13.2 12.5(L) 12.4(L)  Hematocrit 38.4 - 49.9 % 40.5 38.0(L) 37.9(L)  Platelets 140 - 400 K/uL 153 155 216   CMP Latest Ref Rng & Units 01/18/2018 12/28/2017 12/07/2017  Glucose 70 - 140 mg/dL 104 111 92  BUN 7 - 26 mg/dL _0 Creatinine 0.70 - 1.30  mg/dL 0.90 0.85 0.86  Sodium 136 - 145 mmol/L 141 141 141  Potassium 3.5 - 5.1 mmol/L 4.3 4.1 4.6  Chloride 98 - 109 mmol/L 103 105 105  CO2 22 - 29 mmol/L 30(H) 29 28  Calcium 8.4 - 10.4 mg/dL 9.7 9.4 9.7  Total Protein 6.4 - 8.3 g/dL 7.4 7.1 7.0  Total Bilirubin 0.2 - 1.2 mg/dL 0.9 0.5 0.4  Alkaline Phos 40 - 150 U/L 85 93 87  AST 5 - 34 U/L _0 ALT 0 - 55 U/L _1 PATHOLOGY RESULTS:  Diagnosis 10/10/17  1. Colon, segmental resection for tumor, sigmoid SMALL FOCUS OF COLONIC GLANDS WITH HIGH GRADE DYSPLASIA [name] NEOADJUVANT CHEMORADIATION THERAPY BIOPSY SITE WITH CHRONIC INFLAMMATION NO RESIDULE INVASIVE CARCINOMA PRESENT TWENTY-TWO BENIGN LYMPH NODES (0/22) 2. Colon, resection margin (donut), final distal BENIGN COLONIC TISSUE Microscopic Comment 1. COLON AND RECTUM (INCLUDING TRANS-ANAL RESECTION): Specimen: Sigmoid Procedure: Segmental resection Tumor site: Posterior wall of the proximal rectum Specimen integrity: Intact Macroscopic intactness of mesorectum: Not applicable: X Complete: NA Near complete: NA Incomplete: NA Cannot be determined (specify): NA Macroscopic tumor perforation: No residual tumor identified Invasive tumor: Maximum size: No residual tumor Histologic type(s): adenocarcinoma (biopsy [id number]) Histologic grade and differentiation: G3 G1: well differentiated/low grade G2: moderately differentiated/low grade G3: poorly differentiated/high grade G4: undifferentiated/high grade 1 of 3 FINAL for VITALI, SEIBERT [phone]) Microscopic Comment(continued) Type of polyp in which invasive carcinoma arose: Tubular adenoma Microscopic extension of invasive tumor: No residual invasive carcinoma Lymph-Vascular invasion: Negative Peri-neural invasion: Negative Tumor deposit(s) (discontinuous extramural extension): negative Resection margins: Proximal margin: Negative Distal margin: Negative Circumferential (radial) (posterior  ascending, posterior descending; lateral and posterior mid-rectum; and entire lower 1/3 rectum):Negative Mesenteric margin (sigmoid and transverse): negative Distance closest margin (if all above margins negative): NA Trans-anal resection margins only: Deep margin: NA Mucosal Margin: NA Distance closest mucosal margin (if negative): NA Treatment effect (neo-adjuvant therapy): Complete response Additional polyp(s): Negative Non-neoplastic findings: unremarkable Lymph nodes: number examined 22; number positive: 0 Pathologic Staging: ypTis, ypN0, ypMx Ancillary studies: per request    Diagnosis 07/05/17 Rectum, biopsy - INVASIVE ADENOCARCINOMA. - SEE COMMENT. Microscopic Comment The adenocarcinoma is poorly differentiated. Dr. Vicente Males has reviewed the case and concurs with this interpretation. Dr. Paulita [name] was paged on 07/06/17. (JBK:gt, 07/06/17)  PROCEDURES  COLONOSCOPY: Dr. Paulita [name] 07/05/2017 - Internal hemorrhoids. - Likely malignant partially obstructing tumor in the recto-sigmoid colon. Injected. Biopsied.   RADIOGRAPHIC STUDIES: I have personally reviewed the radiological images as listed and agreed with the findings in the report. No results found.  ASSESSMENT & PLAN:  Paul [name] is a 52 y.o.  with history of allergies, HTN, GERD, and chronic low back pain. He presents with rectal cancer diagnosed 07/05/2017.  1. Poorly differentiated invasive rectal adenocarcinoma, cT3N1M0, stage IIIb, ypT0N0 -I previously reviewed his biopsy and CT, MRI scan results with the patient and his friend in detail.  -His tumor is located in the rectosigmoid colon. -We discussed he has likely locally advanced stage IIIb disease based on tumor size, invasion into the muscularis, and positive lymph node, no distant metastasis on day CT and MRI of abdomen and pelvis. -CT chest was negative for metastasis -He has seen Dr. Johney [name] on 07/09/17 who recommends neoadjuvant chemoradiation to help  shrink tumor followed by surgical low anterior resection with possible adjuvant chemotherapy after surgery. We are in agreement with this plan. -He underwent concurrent chemoRT with Xeloda on 07/18/17-08/24/17, with mild nausea and  rectal bleeding and residual effects on his urine output flow and rectal pain.  -He underwent low anterior resection surgery on 10/10/17, his surgical pathology showed complete response to to neoadjuvant chemoradiation therapy.  No residual tumor, or 22 lymph nodes were negative. -He started adjuvant 2000 mg Xeloda BID on 11/19/17, plan for 5 cycles  -He previouskly reported he had been taking Xeloda in the morning with breakfast and with lunch 4-5 hours later. I strongly advised him to take Xeloda 10-12 hours apart. He can do this with a snack around 6 PM if he eats breakfast at 7 AM -Has has been having diarrhea during his off week. He is due to restart Cycle 4 Xeloda in 4 days. I advised him that he can hold it a few more days if his symptoms persist.  -Labs reviewed, adequate to proceed with Cycle 4 Xeloda  -Plan to scan after he completes adjuvant Xeloda (2 more cycles) -F/u in 3 weeks   2. Anemia; iron deficiency  -He has mild anemia, Hgb 12.5 previously; Iron studies 12/07/17 reveal ferritin 7, serum iron 38, saturation ratio 10%, slightly worse from January. He had rectal bleeding that recently stopped.  -he is currently taking oral iron 1 tablet daily. Lacie recommended he increase to BID and take with vitamin C.  -Will check iron studies periodically and consider IV Feraheme if he does not respond to oral iron therapy.   2. 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    3. Nausea, frequent bowel  movement, diarrhea/constipation, Gas  -He has been having frequent bowel movement since his surgery, still has loose bowel movement 3-5 times a day.  -Continue Imodium as needed, he takes once a day -Mild nausea, he takes antiemetics as needed. -Pt was seen in the ED on 11/20/17 for bowel dysfunction. He appeared to have rectal tissue prolapse. The tissue was removed and he had no following symptoms.  -I previously suggested that he eat more vegetables and a fiber rich diet  - I suggested he use half dose miralax in the future if needed and he can use colace as a stool softener as needed  -I suggested he use Gas X or maalox as needed and avoid dairy   PLAN: -Labs reviewed, proceed with cycle 4 adjuvant Xeloda 2000 mg BID x14 days on/7 days off, start on 4/1 or hold a few days if he still isn't recovered well over the weekend  -Return in 3 weeks for f/u and cycle 5 (last cycle) -Order CT scan next visit    No orders of the defined types were placed in this encounter.  All questions were answered. The patient knows to call the clinic with any problems, questions or concerns. I spent 20 minutes counseling the patient face to face. The total time spent in the appointment was 25 minutes.  This document serves as a record of services personally performed by Paul Merle, MD. It was created on her behalf by Theresia Bough, a trained medical scribe. The creation of this record is based on the scribe's personal observations and the provider's statements to them.   I have reviewed the above documentation for accuracy and completeness, and I agree with the above.   Paul Merle, MD 01/18/2018 2:00 PM

## 2018-01-17 ENCOUNTER — Telehealth: Payer: Self-pay | Admitting: Hematology

## 2018-01-17 NOTE — Telephone Encounter (Signed)
01/17/18 @ 2:45 pm. Spoke with patient to inform FMLA has been successfully faxed to [employer] at [phone]. Patient will pick up copy from front desk tomorrow.

## 2018-01-18 ENCOUNTER — Inpatient Hospital Stay (HOSPITAL_BASED_OUTPATIENT_CLINIC_OR_DEPARTMENT_OTHER): Payer: BLUE CROSS/BLUE SHIELD | Admitting: Hematology

## 2018-01-18 ENCOUNTER — Encounter: Payer: Self-pay | Admitting: Hematology

## 2018-01-18 ENCOUNTER — Inpatient Hospital Stay: Payer: BLUE CROSS/BLUE SHIELD

## 2018-01-18 ENCOUNTER — Telehealth: Payer: Self-pay | Admitting: Hematology

## 2018-01-18 ENCOUNTER — Other Ambulatory Visit: Payer: Self-pay | Admitting: *Deleted

## 2018-01-18 VITALS — BP 143/92 | HR 80 | Temp 98.0°F | Resp 18 | Ht 73.0 in | Wt 191.1 lb

## 2018-01-18 DIAGNOSIS — C20 Malignant neoplasm of rectum: Secondary | ICD-10-CM

## 2018-01-18 DIAGNOSIS — R197 Diarrhea, unspecified: Secondary | ICD-10-CM

## 2018-01-18 DIAGNOSIS — M545 Low back pain: Secondary | ICD-10-CM | POA: Diagnosis not present

## 2018-01-18 DIAGNOSIS — R110 Nausea: Secondary | ICD-10-CM

## 2018-01-18 DIAGNOSIS — D509 Iron deficiency anemia, unspecified: Secondary | ICD-10-CM

## 2018-01-18 DIAGNOSIS — I10 Essential (primary) hypertension: Secondary | ICD-10-CM | POA: Diagnosis not present

## 2018-01-18 DIAGNOSIS — D500 Iron deficiency anemia secondary to blood loss (chronic): Secondary | ICD-10-CM

## 2018-01-18 DIAGNOSIS — G8929 Other chronic pain: Secondary | ICD-10-CM

## 2018-01-18 DIAGNOSIS — Z923 Personal history of irradiation: Secondary | ICD-10-CM | POA: Diagnosis not present

## 2018-01-18 LAB — CBC WITH DIFFERENTIAL/PLATELET
BASOS ABS: 0 10*3/uL (ref 0.0–0.1)
Basophils Relative: 1 %
EOS PCT: 9 %
Eosinophils Absolute: 0.3 10*3/uL (ref 0.0–0.5)
HCT: 40.5 % (ref 38.4–49.9)
Hemoglobin: 13.2 g/dL (ref 13.0–17.1)
Lymphocytes Relative: 12 %
Lymphs Abs: 0.4 10*3/uL — ABNORMAL LOW (ref 0.9–3.3)
MCH: 28.1 pg (ref 27.2–33.4)
MCHC: 32.7 g/dL (ref 32.0–36.0)
MCV: 86.1 fL (ref 79.3–98.0)
Monocytes Absolute: 0.4 10*3/uL (ref 0.1–0.9)
Monocytes Relative: 13 %
NEUTROS ABS: 2.1 10*3/uL (ref 1.5–6.5)
Neutrophils Relative %: 65 %
PLATELETS: 153 10*3/uL (ref 140–400)
RBC: 4.71 MIL/uL (ref 4.20–5.82)
RDW: 23 % — ABNORMAL HIGH (ref 11.0–14.6)
WBC: 3.1 10*3/uL — AB (ref 4.0–10.3)

## 2018-01-18 LAB — IRON AND TIBC
Iron: 95 ug/dL (ref 42–163)
SATURATION RATIOS: 22 % — AB (ref 42–163)
TIBC: 428 ug/dL — ABNORMAL HIGH (ref 202–409)
UIBC: 332 ug/dL

## 2018-01-18 LAB — COMPREHENSIVE METABOLIC PANEL
ALT: 21 U/L (ref 0–55)
ANION GAP: 8 (ref 3–11)
AST: 22 U/L (ref 5–34)
Albumin: 4.2 g/dL (ref 3.5–5.0)
Alkaline Phosphatase: 85 U/L (ref 40–150)
BILIRUBIN TOTAL: 0.9 mg/dL (ref 0.2–1.2)
BUN: 9 mg/dL (ref 7–26)
CO2: 30 mmol/L — ABNORMAL HIGH (ref 22–29)
Calcium: 9.7 mg/dL (ref 8.4–10.4)
Chloride: 103 mmol/L (ref 98–109)
Creatinine, Ser: 0.9 mg/dL (ref 0.70–1.30)
Glucose, Bld: 104 mg/dL (ref 70–140)
Potassium: 4.3 mmol/L (ref 3.5–5.1)
Sodium: 141 mmol/L (ref 136–145)
TOTAL PROTEIN: 7.4 g/dL (ref 6.4–8.3)

## 2018-01-18 LAB — CEA (IN HOUSE-CHCC): CEA (CHCC-In House): 4.19 ng/mL (ref 0.00–5.00)

## 2018-01-18 LAB — FERRITIN: Ferritin: 20 ng/mL — ABNORMAL LOW (ref 22–316)

## 2018-01-18 NOTE — Telephone Encounter (Signed)
Appointments scheduled AVS/Calendar printed per 3/29 los °

## 2018-02-06 NOTE — Progress Notes (Signed)
[place]  [phone]) [phone] Fax:([phone]) [phone]  Clinic Follow Up Note   Patient Care Team: Eulas Post, MD as PCP - General (Family Medicine) Arta Silence, MD as Consulting Physician (Gastroenterology) Michael Boston, MD as Consulting Physician (General Surgery) Kyung Rudd, MD as Consulting Physician (Radiation Oncology) Truitt Merle, MD as Consulting Physician (Oncology)   Date of Service:  02/08/2018   CHIEF COMPLAINTS:  F/u rectal cancer  Oncology History   Cancer Staging Rectal adenocarcinoma [facility]) Staging form: Colon and Rectum, AJCC 8th Edition - Clinical stage from 07/06/2017: Stage IIIB (cT3, cN1, cM0) - Signed by [name], NP on 07/10/2017      Rectal adenocarcinoma s/p robotic LAR resection 10/10/2017   07/03/2017 Imaging    CT ABD PELVIS W CONTRAST IMPRESSION: Possible mass in the rectum compatible carcinoma. Endoscopy recommended for further evaluation. Otherwise negative.      07/03/2017 Tumor Marker    CEA 2.7      07/05/2017 Initial Biopsy    Rectum biopsy -invasive adenocarcinoma, poorly differentiated      07/05/2017 Procedure    Colonoscopy Per Dr. Paulita Fujita Findings: The perianal and digital rectal examinations were normal.  Internal hemorrhoids were found during retroflexion. The hemorrhoids were moderate. No additional abnormalities were found on retroflexion.  A fungating, sessile and ulcerated partially obstructing large mass was found in the recto-sigmoid colon.The mass was partially circumferential (involving two-thirds of the lumen circumference). Oozing was present. Area was successfully injected with 2 mL [place] ink for tattooing. This was biopsied with a cold forceps for histology.      07/05/2017 Initial Diagnosis    Rectal adenocarcinoma ([place])      07/06/2017 Imaging    Staging MRI ABD/PELVIS revealed clinical stage T3N1M0, IIIb       07/18/2017 - 08/24/2017 Radiation Therapy    Radaition with Dr  Lisbeth Renshaw     Radiation treatment dates:   07/18/2017 - 08/24/2017  Site/dose:   The rectum was treated to 45 Gy in 25 fractions of 1.8 Gy, followed by a 5.4 Gy boost in 3 fractions to yield a total dose of 50.4 Gy.  Narrative: The patient tolerated radiation treatment relatively well.   He had rectal bleeding with clots that was bright red after bowel movements. He reported loose stools that were painful. The skin to the radiation site was irritated, but no skin breakage was noted.       07/18/2017 Imaging    CT CHEST  IMPRESSION: Negative. No evidence of thoracic metastatic disease or other significant abnormality.       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  The Hereditary Gene Panel offered by [facility] includes sequencing and/or deletion duplication testing of the following 47 genes: APC, ATM, AXIN2, BARD1, BMPR1A, BRCA1, BRCA2, BRIP1, CDH1, CDK4, CDKN2A (p14ARF), CDKN2A (p16INK4a), CHEK2, CTNNA1, DICER1, EPCAM (Deletion/duplication testing only), GREM1 (promoter region deletion/duplication testing only), KIT, MEN1, MLH1, MSH2, MSH3, MSH6, MUTYH, NBN, NF1, NHTL1, PALB2, PDGFRA, PMS2, POLD1, POLE, PTEN, RAD50, RAD51C, RAD51D, SDHB, SDHC, SDHD, SMAD4, SMARCA4. STK11, TP53, TSC1, TSC2, and VHL.  The following genes were evaluated for sequence changes only: SDHA and HOXB13 c.251G>A variant only.  The report date is August 27, 2017.       10/10/2017 Surgery    XI ROBOTIC ASSISTED  LOWER ANTERIOR RESECTION AND RIGID PROCTOSCOPY bu Dr. Johney Maine and Dr. Dema Severin  10/10/17       10/10/2017 Pathology Results        Diagnosis 10/10/17  1. Colon, segmental resection for tumor, sigmoid SMALL FOCUS OF COLONIC GLANDS WITH HIGH GRADE DYSPLASIA POST NEOADJUVANT CHEMORADIATION THERAPY BIOPSY SITE WITH  CHRONIC INFLAMMATION NO RESIDULE INVASIVE CARCINOMA PRESENT TWENTY-TWO BENIGN LYMPH NODES (0/22) 2. Colon, resection margin (donut), final distal BENIGN COLONIC TISSUE      11/19/2017 -  Adjuvant Chemotherapy    adjuvant Xeloda 2000 mg BID      HISTORY OF PRESENTING ILLNESS:  Paul [name] 52 y.o. male is here because of newly diagnosed rectal cancer. He has a long standing history of loose stool and developed diarrhea within the last 4 weeks. He noticed 3 episodes in 1 day of blood on toilet paper which prompted him to go to the ER on 07/03/2017. A CT ABD and pelvis revealed a mass in the rectum compatible with carcinoma. Incidentally, he tested positive for shiga like toxin producing E coli while in the ED. He was discharged from ED with no new medication. Colonoscopy on 07/05/2017 per Dr. Paulita Fujita revealed a fungating, sessile and ulcerated partially obstructing large mass in the recto-sigmoid colon. The mass was partially circumferential involving two-thirds of the lumen circumference. Oozing was present. Biopsy on 07/05/17 confirmed invasive adenocarcinoma of the rectum. Staging MRI on 07/06/17 revealed extension through muscularis propria 1-43m and nodes within the mesorectum and sigmoid meso colon measuring 966m indicating cT3N1Mo, stage IIIb. He was referred to GI surgery Dr. GrJohney Maineseen 07/09/2017.  Today he presents to clinic with a friend, referred by Dr. GrJohney MaineHe feels well overall with good appetite, stable weight, and no fatigue. His diarrhea has resolved but reports 2-3 loose stools per day, associated with cramping. No nausea, vomiting, or abdominal pain.   CURRENT THERAPY: adjuvant Xeloda 2000 mg BID started on 11/19/17  INTERIM HISTORY:  Paul Guilletteeturns today for follow up and to start Cycle 5 Xeloda. He presents to the clinic today by himself. He reports he had a difficult time with his last cycle Xeloda due to increased amount of bowel movements. He states he was having 8-12  loose bowel movements a day but it wasn't everyday. He used imodium but became constipated. He reports his bowel movements are better during his off week. He does have skin sensitivity and skin peeling on both of his hands with skin darkening as well. He has the same symptoms on his feet.   On review of systems, pt denies any other complaints at this time. Pertinent positives are listed and detailed within the above HPI.   MEDICAL HISTORY:  Past Medical History:  Diagnosis Date  . Allergy    grass, dander  . Essential hypertension 07/03/2017  . Family history of prostate cancer   . GERD (gastroesophageal reflux disease)   . History of meningitis   . Hypertension   . 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 COLONOSCOPY WITH PROPOFOL Left 07/05/2017   Procedure: COLONOSCOPY WITH PROPOFOL;  Surgeon: OuArta SilenceMD;  [facility]: Endoscopy;  Laterality: Left;  . PROCTOSCOPY N/A 10/10/2017   Procedure: RIGID PROCTOSCOPY;  Surgeon: GrMichael BostonMD;  Location: [facility];  Service: General;  Laterality: N/A;  . XI ROBOTIC ASSISTED LOWER ANTERIOR RESECTION N/A 10/10/2017  Procedure: XI ROBOTIC ASSISTED LOWER ANTERIOR RESECTION;  Surgeon: Michael Boston, MD;  Location: [facility];  Service: General;  Laterality: N/A;   SOCIAL HISTORY: Social History   Socioeconomic History  . Marital status: Married    Spouse name: Not on file  . Number of children: Not on file  . Years of education: Not on file  . Highest education level: Not on file  Occupational History  . Not on file  Social Needs  . Financial resource strain: Not on file  . Food insecurity:    Worry: Not on file    Inability: Not on file  . Transportation needs:    Medical: Not on file    Non-medical: Not on file  Tobacco Use  . Smoking status:  Never Smoker  . Smokeless tobacco: Never Used  Substance and Sexual Activity  . Alcohol use: No  . Drug use: No  . Sexual activity: Not on file  Lifestyle  . Physical activity:    Days per week: Not on file    Minutes per session: Not on file  . Stress: Not on file  Relationships  . Social connections:    Talks on phone: Not on file    Gets together: Not on file    Attends religious service: Not on file    Active member of club or organization: Not on file    Attends meetings of clubs or organizations: Not on file    Relationship status: Not on file  . Intimate partner violence:    Fear of current or ex partner: Not on file    Emotionally abused: Not on file    Physically abused: Not on file    Forced sexual activity: Not on file  Other Topics Concern  . Not on file  Social History Narrative   Pronouced "KA[place]o--[place]"   Originally from [place], [place], [place]   Patient is married with 3 children, ages 26, 62, and 59. He lives in a home with his wife and kids. 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 He works 3-11:30 pm shift; he has morning/daytime availability for appointments and treatments. He prefers to work during anticipated treatment but can apply for [employer]. He will send appropriate paperwork when the time comes.   FAMILY HISTORY: Family History  Problem Relation Age of Onset  . Hypertension Mother   . Asthma Father   . Cancer Father        Prostate  . Asthma Son   . Asthma Son   . Asthma Daughter   . Dementia Maternal Grandmother    ALLERGIES:  is allergic to chloroquine.  MEDICATIONS:  Current Outpatient Medications  Medication Sig Dispense Refill  . amLODipine (NORVASC) 5 MG tablet TAKE 1 TABLET DAILY 90 tablet 1  . calcium carbonate (OS-CAL - DOSED IN MG OF ELEMENTAL CALCIUM) 1250 (500 Ca) MG tablet Take 1 tablet by mouth daily as needed (for  bones).    . Calcium Citrate-Vitamin D (CALCIUM CITRATE+D3 PETITES PO) Take 1 tablet by mouth daily.    . capecitabine (XELODA) 500 MG tablet Take 4 tablets (2,000 mg total) by mouth 2 (two) times daily after a meal. Take on days 1-14 and off days 15-21 every 21 days. 112 tablet 2  . cetirizine (ZYRTEC) 10 MG tablet Take 10 mg by mouth daily as needed for allergies.    . ferrous sulfate  325 (65 FE) MG EC tablet Take 1 tablet (325 mg total) by mouth daily. 30 tablet 1  . GARLIC PO Take 1 capsule by mouth daily.     . Multiple Vitamin (MULTIVITAMIN WITH MINERALS) TABS tablet Take 1 tablet by mouth daily.    . naproxen (NAPROSYN) 500 MG tablet Take 1 tablet (500 mg total) by mouth every 12 (twelve) hours as needed for mild pain or moderate pain. 30 tablet 1  . omeprazole (PRILOSEC) 40 MG capsule Take 40 mg by mouth daily as needed (heart burn).     . prochlorperazine (COMPAZINE) 10 MG tablet Take 1 tablet (10 mg total) by mouth every 6 (six) hours as needed for nausea or vomiting. 30 tablet 2  . traMADol (ULTRAM) 50 MG tablet Take 1-2 tablets (50-100 mg total) by mouth every 6 (six) hours as needed for moderate pain or severe pain. 30 tablet 0  . ondansetron (ZOFRAN) 8 MG tablet Take 1 tablet (8 mg total) by mouth every 8 (eight) hours as needed for nausea or vomiting. (Patient not taking: Reported on 02/08/2018) 30 tablet 3   No current facility-administered medications for this visit.    REVIEW OF SYSTEMS:   Constitutional: Denies fatigue, fevers, chills, abnormal night sweats, or weight loss. Eyes: Denies blurriness of vision, double vision or watery eyes Ears, nose, mouth, throat, and face: Denies mucositis or sore throat Respiratory: Denies cough, dyspnea or wheezes Cardiovascular: Denies palpitation, chest discomfort or lower extremity swelling Gastrointestinal:  no nausea, vomiting, constipation, controlled (+) rectal pain due to radiation, managed with pain medication (+) mild rectal  bleeding (+) frequent bowel movements (+) gas (+) diarrhea (+) constipation Skin: Denies abnormal skin rashes on the feet (+) dryness of hands and feet (+) peeling of skin on hands Lymphatics: Denies new lymphadenopathy or easy bruising Neurological:Denies numbness, tingling or new weaknesses Behavioral/Psych: Mood is stable, no new changes  All other systems were reviewed with the patient and are negative.  PHYSICAL EXAMINATION:  ECOG PERFORMANCE STATUS: 1  Vitals:   02/08/18 0834  BP: (!) 150/84  Pulse: 74  Resp: 17  Temp: 98.6 F (37 C)  SpO2: 99%   Filed Weights   02/08/18 0834  Weight: 192 lb 12.8 oz (87.5 kg)    GENERAL:alert, no distress and comfortable SKIN: skin color, texture, turgor are normal, no rashes or significant lesions, except skin hyperpigmentation and mild peeling on bilateral hands and feet EYES: normal, conjunctiva are pink and non-injected, sclera clear OROPHARYNX:no exudate, no erythema and lips, buccal mucosa, and tongue normal  NECK: supple, thyroid normal size, non-tender, without nodularity LYMPH:  no palpable cervical or supraclavicular lymphadenopathy  LUNGS: clear to auscultation bilaterally with normal breathing effort HEART: regular rate & rhythm, S1 and S2 present; no murmurs and no lower extremity edema ABDOMEN:abdomen soft, flat, non-tender and normal bowel sounds. No palpable hepatosplenomegaly or masses. Low anterior incision and laparoscopic incisions have both healed very well.  MSK:no cyanosis of digits and no clubbing  PSYCH: alert & oriented x 3 with fluent speech NEURO: no focal motor/sensory deficits RECTAL: exam was deferred   LABORATORY DATA:  I have reviewed the data as listed CBC Latest Ref Rng & Units 02/08/2018 01/18/2018 12/28/2017  WBC 4.0 - 10.3 K/uL 2.9(L) 3.1(L) 2.6(L)  Hemoglobin 13.0 - 17.1 g/dL 12.4(L) 13.2 12.5(L)  Hematocrit 38.4 - 49.9 % 36.3(L) 40.5 38.0(L)  Platelets 140 - 400 K/uL 157 153 155   CMP Latest Ref  Rng & Units 01/18/2018 12/28/2017  12/07/2017  Glucose 70 - 140 mg/dL 104 111 92  BUN 7 - 26 mg/dL _0 Creatinine 0.70 - 1.30 mg/dL 0.90 0.85 0.86  Sodium 136 - 145 mmol/L 141 141 141  Potassium 3.5 - 5.1 mmol/L 4.3 4.1 4.6  Chloride 98 - 109 mmol/L 103 105 105  CO2 22 - 29 mmol/L 30(H) 29 28  Calcium 8.4 - 10.4 mg/dL 9.7 9.4 9.7  Total Protein 6.4 - 8.3 g/dL 7.4 7.1 7.0  Total Bilirubin 0.2 - 1.2 mg/dL 0.9 0.5 0.4  Alkaline Phos 40 - 150 U/L 85 93 87  AST 5 - 34 U/L _1 ALT 0 - 55 U/L _2 PATHOLOGY RESULTS:  Diagnosis 10/10/17  1. Colon, segmental resection for tumor, sigmoid SMALL FOCUS OF COLONIC GLANDS WITH HIGH GRADE DYSPLASIA POST NEOADJUVANT CHEMORADIATION THERAPY BIOPSY SITE WITH CHRONIC INFLAMMATION NO RESIDULE INVASIVE CARCINOMA PRESENT TWENTY-TWO BENIGN LYMPH NODES (0/22) 2. Colon, resection margin (donut), final distal BENIGN COLONIC TISSUE Microscopic Comment 1. COLON AND RECTUM (INCLUDING TRANS-ANAL RESECTION): Specimen: Sigmoid Procedure: Segmental resection Tumor site: Posterior wall of the proximal rectum Specimen integrity: Intact Macroscopic intactness of mesorectum: Not applicable: X Complete: NA Near complete: NA Incomplete: NA Cannot be determined (specify): NA Macroscopic tumor perforation: No residual tumor identified Invasive tumor: Maximum size: No residual tumor Histologic type(s): adenocarcinoma (biopsy [id number]) Histologic grade and differentiation: G3 G1: well differentiated/low grade G2: moderately differentiated/low grade G3: poorly differentiated/high grade G4: undifferentiated/high grade 1 of 3 FINAL for Paul [name], Paul [name] [phone]) Microscopic Comment(continued) Type of polyp in which invasive carcinoma arose: Tubular adenoma Microscopic extension of invasive tumor: No residual invasive carcinoma Lymph-Vascular invasion: Negative Peri-neural invasion: Negative Tumor deposit(s) (discontinuous extramural  extension): negative Resection margins: Proximal margin: Negative Distal margin: Negative Circumferential (radial) (posterior ascending, posterior descending; lateral and posterior mid-rectum; and entire lower 1/3 rectum):Negative Mesenteric margin (sigmoid and transverse): negative Distance closest margin (if all above margins negative): NA Trans-anal resection margins only: Deep margin: NA Mucosal Margin: NA Distance closest mucosal margin (if negative): NA Treatment effect (neo-adjuvant therapy): Complete response Additional polyp(s): Negative Non-neoplastic findings: unremarkable Lymph nodes: number examined 22; number positive: 0 Pathologic Staging: ypTis, ypN0, ypMx Ancillary studies: per request    Diagnosis 07/05/17 Rectum, biopsy - INVASIVE ADENOCARCINOMA. - SEE COMMENT. Microscopic Comment The adenocarcinoma is poorly differentiated. Dr. Vicente Males has reviewed the case and concurs with this interpretation. Dr. Paulita Fujita was paged on 07/06/17. (JBK:gt, 07/06/17)  PROCEDURES  COLONOSCOPY: Dr. Paulita Fujita 07/05/2017 - Internal hemorrhoids. - Likely malignant partially obstructing tumor in the recto-sigmoid colon. Injected. Biopsied.   RADIOGRAPHIC STUDIES: I have personally reviewed the radiological images as listed and agreed with the findings in the report. No results found.  ASSESSMENT & PLAN:  Paul [name] is a 52 y.o.  with history of allergies, HTN, GERD, and chronic low back pain. He presents with rectal cancer diagnosed 07/05/2017.  1. Poorly differentiated invasive rectal adenocarcinoma, cT3N1M0, stage IIIb, ypT0N0 -I previously reviewed his biopsy and CT, MRI scan results with the patient and his friend in detail.  -His tumor is located in the rectosigmoid colon. -We discussed he has likely locally advanced stage IIIb disease based on tumor size, invasion into the muscularis, and positive lymph node, no distant metastasis on day CT and MRI of abdomen and  pelvis. -CT chest was negative for metastasis -He saw Dr. Johney Maine on 07/09/17 who recommended neoadjuvant chemoradiation to help shrink tumor followed by surgical low anterior resection with possible  adjuvant chemotherapy after surgery. We are in agreement with this plan. -He underwent concurrent chemoRT with Xeloda on 07/18/17-08/24/17, with mild nausea and rectal bleeding and residual effects on his urine output flow and rectal pain.  -He underwent low anterior resection surgery on 10/10/17, his surgical pathology showed complete response to to neoadjuvant chemoradiation therapy. No residual tumor and 22 lymph nodes were negative. -He started adjuvant 2000 mg Xeloda BID on 11/19/17, plan for 5 cycles  -He is tolerating Xeloda well overall, with mild to moderate diarrhea, mild hand-foot syndrome  -He reports intermittent suprapubic cramping pain. I discussed that this is likely pain from the bowel.  -Labs reviewed, adequate to proceed with Cycle 5 Xeloda on 02/11/18 -I will order a surveillance CT AP W Contrast to be done before our next follow up in 2 months  2. Anemia; iron deficiency  -He has mild anemia, Hgb 12.5 previously; Iron studies 12/07/17 reveal ferritin 7, serum iron 38, saturation ratio 10%, slightly worse from January. He had rectal bleeding that recently stopped.  -he is currently taking oral iron 1 tablet daily. Lacie previously recommended he increase to BID and take with vitamin C.  -Will check iron studies periodically and consider IV Feraheme if he does not respond to oral iron therapy.   3. 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    4. Nausea, frequent bowel movement, diarrhea/constipation, Gas  -He has been  having frequent bowel movement since his surgery, still has loose bowel movement 3-5 times a day.  -Continue Imodium as needed, he takes once a day -Mild nausea, he takes antiemetics as needed. -Pt was seen in the ED on 11/20/17 for bowel dysfunction. He appeared to have rectal tissue prolapse. The tissue was removed and he had no following symptoms.  - I previously suggested he use half dose miralax in the future if needed and he can use colace as a stool softener as needed  -I previously suggested he use Gas X or maalox as needed and avoid dairy  -I will send Nutrition a message to see if they can call him. I discussed that after treatment for his cancer, his bowel movements may never return to normal and it is important to know what foods to eat  5. HTN -His BP has been high in the clinic today and previously. I advised him to f/u with his PCP after he finishes Xeloda in the next 2-3 weeks to see if his medication needs to be adjusted.  -he is currently taking 5 mg amlodipine -I also encouraged him to check his BP at home and document the reading   PLAN: -Labs reviewed, proceed with cycle 5 adjuvant Xeloda 2000 mg BID x14 days on/7 days off, start on 4/22  -ordered a surveillance CT AP W Contrast to be done before our next follow up in 2 months   Orders Placed This Encounter  Procedures  . CT Abdomen Pelvis W Contrast    Standing Status:   Future    Standing Expiration Date:   02/08/2019    Order Specific Question:   If indicated for the ordered procedure, I authorize the administration of contrast media per Radiology protocol    Answer:   Yes    Order Specific Question:  Preferred imaging location?    Answer:   [facility]    Order Specific Question:   Is Oral Contrast requested for this exam?    Answer:   Per Radiology protocol    Order Specific Question:   Radiology Contrast Protocol - do NOT remove file path    Answer:   _0 [email or web address]    All questions were answered. The patient knows to call the clinic with any problems, questions or concerns. I spent 20 minutes counseling the patient face to face. The total time spent in the appointment was 25 minutes.  This document serves as a record of services personally performed by Truitt Merle, MD. It was created on her behalf by Theresia Bough, a trained medical scribe. The creation of this record is based on the scribe's personal observations and the provider's statements to them.   I have reviewed the above documentation for accuracy and completeness, and I agree with the above.   Truitt Merle, MD 02/08/2018 9:03 AM

## 2018-02-08 ENCOUNTER — Inpatient Hospital Stay: Payer: BLUE CROSS/BLUE SHIELD | Attending: Hematology

## 2018-02-08 ENCOUNTER — Encounter: Payer: Self-pay | Admitting: Hematology

## 2018-02-08 ENCOUNTER — Telehealth: Payer: Self-pay | Admitting: *Deleted

## 2018-02-08 ENCOUNTER — Inpatient Hospital Stay (HOSPITAL_BASED_OUTPATIENT_CLINIC_OR_DEPARTMENT_OTHER): Payer: BLUE CROSS/BLUE SHIELD | Admitting: Hematology

## 2018-02-08 ENCOUNTER — Telehealth: Payer: Self-pay | Admitting: Hematology

## 2018-02-08 VITALS — BP 150/84 | HR 74 | Temp 98.6°F | Resp 17 | Ht 73.0 in | Wt 192.8 lb

## 2018-02-08 DIAGNOSIS — G8929 Other chronic pain: Secondary | ICD-10-CM | POA: Insufficient documentation

## 2018-02-08 DIAGNOSIS — D509 Iron deficiency anemia, unspecified: Secondary | ICD-10-CM

## 2018-02-08 DIAGNOSIS — C20 Malignant neoplasm of rectum: Secondary | ICD-10-CM | POA: Diagnosis not present

## 2018-02-08 DIAGNOSIS — I10 Essential (primary) hypertension: Secondary | ICD-10-CM

## 2018-02-08 DIAGNOSIS — D500 Iron deficiency anemia secondary to blood loss (chronic): Secondary | ICD-10-CM

## 2018-02-08 DIAGNOSIS — M545 Low back pain: Secondary | ICD-10-CM

## 2018-02-08 LAB — CBC WITH DIFFERENTIAL/PLATELET
BASOS PCT: 1 %
Basophils Absolute: 0 10*3/uL (ref 0.0–0.1)
EOS ABS: 0.2 10*3/uL (ref 0.0–0.5)
Eosinophils Relative: 7 %
HCT: 36.3 % — ABNORMAL LOW (ref 38.4–49.9)
HEMOGLOBIN: 12.4 g/dL — AB (ref 13.0–17.1)
Lymphocytes Relative: 17 %
Lymphs Abs: 0.5 10*3/uL — ABNORMAL LOW (ref 0.9–3.3)
MCH: 30.1 pg (ref 27.2–33.4)
MCHC: 34.1 g/dL (ref 32.0–36.0)
MCV: 88.1 fL (ref 79.3–98.0)
MONOS PCT: 10 %
Monocytes Absolute: 0.3 10*3/uL (ref 0.1–0.9)
NEUTROS PCT: 65 %
Neutro Abs: 1.9 10*3/uL (ref 1.5–6.5)
Platelets: 157 10*3/uL (ref 140–400)
RBC: 4.12 MIL/uL — ABNORMAL LOW (ref 4.20–5.82)
RDW: 27.9 % — AB (ref 11.0–14.6)
WBC: 2.9 10*3/uL — AB (ref 4.0–10.3)

## 2018-02-08 LAB — COMPREHENSIVE METABOLIC PANEL
ALBUMIN: 4.1 g/dL (ref 3.5–5.0)
ALK PHOS: 88 U/L (ref 40–150)
ALT: 23 U/L (ref 0–55)
ANION GAP: 6 (ref 3–11)
AST: 26 U/L (ref 5–34)
BUN: 12 mg/dL (ref 7–26)
CO2: 27 mmol/L (ref 22–29)
Calcium: 9.3 mg/dL (ref 8.4–10.4)
Chloride: 105 mmol/L (ref 98–109)
Creatinine, Ser: 0.85 mg/dL (ref 0.70–1.30)
GFR calc Af Amer: >60 mL/min (ref >60)
GFR calc non Af Amer: >60 mL/min (ref >60)
GLUCOSE: 120 mg/dL (ref 70–140)
POTASSIUM: 3.8 mmol/L (ref 3.5–5.1)
SODIUM: 138 mmol/L (ref 136–145)
Total Bilirubin: 0.9 mg/dL (ref 0.2–1.2)
Total Protein: 7.1 g/dL (ref 6.4–8.3)

## 2018-02-08 LAB — IRON AND TIBC
IRON: 83 ug/dL (ref 42–163)
Saturation Ratios: 21 % — ABNORMAL LOW (ref 42–163)
TIBC: 401 ug/dL (ref 202–409)
UIBC: 318 ug/dL

## 2018-02-08 LAB — CEA (IN HOUSE-CHCC): CEA (CHCC-In House): 4.56 ng/mL (ref 0.00–5.00)

## 2018-02-08 LAB — FERRITIN: Ferritin: 15 ng/mL — ABNORMAL LOW (ref 22–316)

## 2018-02-08 NOTE — Telephone Encounter (Signed)
Gave patient AVS and calendar of upcoming June appointments.  °

## 2018-02-08 NOTE — Telephone Encounter (Signed)
Called pt & informed of low iron & need to increase oral iron intake & to increase his iron pill to bid.  He expressed appreciation & understanding.

## 2018-02-08 NOTE — Telephone Encounter (Signed)
-----   Message from Truitt Merle, MD sent at 02/08/2018  1:21 PM EDT ----- Please let pt know that his iron level is low, and increase his oral iron intake, take one more pill a day, thanks   Truitt Merle  02/08/2018

## 2018-02-12 ENCOUNTER — Ambulatory Visit: Payer: BLUE CROSS/BLUE SHIELD | Admitting: Nutrition

## 2018-02-12 NOTE — Progress Notes (Signed)
I spoke with patient over the telephone.  Patient has been diagnosed with rectal cancer status post radiation and Xeloda.  He also had a low anterior resection in December 2018.  Past medical history includes GERD and hypertension.  Medications include ferrous sulfate, calcium with vitamin D, multivitamin, Prilosec, Compazine, and Zofran.  Labs were reviewed.  Height: 6 feet 1 inch. Weight: 192.8 pounds. BMI: 25.44.    Patient's weight is stable. He denies diarrhea but reports frequent formed stools. He reports going between frequent stools and constipation. Patient reports increased gas/flatulence. He has been told to avoid dairy. Reports he takes Metamucil per physician instruction.  Nutrition diagnosis:  Food and nutrition related knowledge deficit related to rectal cancer and associated treatments as evidenced by no prior need for nutrition related information.  Intervention: Patient was educated on strategies for improving overall gas.  Recommended patient avoid gas-forming foods such as cruciferous vegetables or dried beans. Brief education provided on lactose intolerance. Recommended patient may want to try yogurt with probiotics or a probiotic supplement. Reviewed education on fiber-containing foods. Provided fact sheets by mail.  Questions were answered.  Teach back method used.  Contact information given.  Monitoring, evaluation, goals: Patient will tolerate adequate calories and protein to maintain weight.  He will modify his diet to improve increased gas and frequent stools.  Patient agrees to contact me with any questions or concerns.

## 2018-02-18 ENCOUNTER — Other Ambulatory Visit: Payer: Self-pay | Admitting: Family Medicine

## 2018-02-18 DIAGNOSIS — R972 Elevated prostate specific antigen [PSA]: Secondary | ICD-10-CM

## 2018-02-22 ENCOUNTER — Other Ambulatory Visit (INDEPENDENT_AMBULATORY_CARE_PROVIDER_SITE_OTHER): Payer: BLUE CROSS/BLUE SHIELD

## 2018-02-22 DIAGNOSIS — R972 Elevated prostate specific antigen [PSA]: Secondary | ICD-10-CM | POA: Diagnosis not present

## 2018-02-22 LAB — PSA: PSA: 1.24 ng/mL (ref 0.10–4.00)

## 2018-04-08 ENCOUNTER — Ambulatory Visit (HOSPITAL_COMMUNITY)
Admission: RE | Admit: 2018-04-08 | Discharge: 2018-04-08 | Disposition: A | Discharge status: Home / Self Care | Payer: BLUE CROSS/BLUE SHIELD | Source: Ambulatory Visit | Attending: Hematology | Admitting: Hematology

## 2018-04-08 ENCOUNTER — Encounter (HOSPITAL_COMMUNITY): Payer: Self-pay

## 2018-04-08 ENCOUNTER — Inpatient Hospital Stay: Payer: BLUE CROSS/BLUE SHIELD | Attending: Hematology

## 2018-04-08 DIAGNOSIS — D509 Iron deficiency anemia, unspecified: Secondary | ICD-10-CM | POA: Diagnosis not present

## 2018-04-08 DIAGNOSIS — Z9889 Other specified postprocedural states: Secondary | ICD-10-CM | POA: Insufficient documentation

## 2018-04-08 DIAGNOSIS — C20 Malignant neoplasm of rectum: Secondary | ICD-10-CM | POA: Insufficient documentation

## 2018-04-08 DIAGNOSIS — I10 Essential (primary) hypertension: Secondary | ICD-10-CM | POA: Insufficient documentation

## 2018-04-08 DIAGNOSIS — Z85048 Personal history of other malignant neoplasm of rectum, rectosigmoid junction, and anus: Secondary | ICD-10-CM | POA: Diagnosis not present

## 2018-04-08 DIAGNOSIS — Z5111 Encounter for antineoplastic chemotherapy: Secondary | ICD-10-CM | POA: Diagnosis not present

## 2018-04-08 DIAGNOSIS — R933 Abnormal findings on diagnostic imaging of other parts of digestive tract: Secondary | ICD-10-CM | POA: Insufficient documentation

## 2018-04-08 DIAGNOSIS — D500 Iron deficiency anemia secondary to blood loss (chronic): Secondary | ICD-10-CM

## 2018-04-08 DIAGNOSIS — Z79899 Other long term (current) drug therapy: Secondary | ICD-10-CM | POA: Insufficient documentation

## 2018-04-08 LAB — FERRITIN: FERRITIN: 18 ng/mL — AB (ref 22–316)

## 2018-04-08 LAB — CBC WITH DIFFERENTIAL/PLATELET
BASOS ABS: 0 10*3/uL (ref 0.0–0.1)
BASOS PCT: 1 %
EOS PCT: 6 %
Eosinophils Absolute: 0.1 10*3/uL (ref 0.0–0.5)
HCT: 37.9 % — ABNORMAL LOW (ref 38.4–49.9)
Hemoglobin: 12.7 g/dL — ABNORMAL LOW (ref 13.0–17.1)
Lymphocytes Relative: 23 %
Lymphs Abs: 0.6 10*3/uL — ABNORMAL LOW (ref 0.9–3.3)
MCH: 30.6 pg (ref 27.2–33.4)
MCHC: 33.4 g/dL (ref 32.0–36.0)
MCV: 91.6 fL (ref 79.3–98.0)
MONO ABS: 0.3 10*3/uL (ref 0.1–0.9)
Monocytes Relative: 13 %
Neutro Abs: 1.4 10*3/uL — ABNORMAL LOW (ref 1.5–6.5)
Neutrophils Relative %: 57 %
PLATELETS: 175 10*3/uL (ref 140–400)
RBC: 4.13 MIL/uL — AB (ref 4.20–5.82)
RDW: 16.7 % — AB (ref 11.0–14.6)
WBC: 2.4 10*3/uL — AB (ref 4.0–10.3)

## 2018-04-08 LAB — COMPREHENSIVE METABOLIC PANEL
ALBUMIN: 4.2 g/dL (ref 3.5–5.0)
ALT: 22 U/L (ref 0–55)
AST: 34 U/L (ref 5–34)
Alkaline Phosphatase: 82 U/L (ref 40–150)
Anion gap: 8 (ref 3–11)
BUN: 9 mg/dL (ref 7–26)
CO2: 25 mmol/L (ref 22–29)
Calcium: 8.9 mg/dL (ref 8.4–10.4)
Chloride: 103 mmol/L (ref 98–109)
Creatinine, Ser: 0.8 mg/dL (ref 0.70–1.30)
GFR calc Af Amer: >60 mL/min (ref >60)
GFR calc non Af Amer: >60 mL/min (ref >60)
GLUCOSE: 100 mg/dL (ref 70–140)
POTASSIUM: 3.8 mmol/L (ref 3.5–5.1)
SODIUM: 136 mmol/L (ref 136–145)
Total Bilirubin: 0.5 mg/dL (ref 0.2–1.2)
Total Protein: 6.9 g/dL (ref 6.4–8.3)

## 2018-04-08 LAB — IRON AND TIBC
Iron: 80 ug/dL (ref 42–163)
SATURATION RATIOS: 22 % — AB (ref 42–163)
TIBC: 359 ug/dL (ref 202–409)
UIBC: 278 ug/dL

## 2018-04-08 LAB — CEA (IN HOUSE-CHCC): CEA (CHCC-In House): 4.39 ng/mL (ref 0.00–5.00)

## 2018-04-08 MED ORDER — IOPAMIDOL (ISOVUE-300) INJECTION 61%
100.0000 mL | Freq: Once | INTRAVENOUS | Status: AC | PRN
  Administered 2018-04-08: 100 mL via INTRAVENOUS

## 2018-04-08 MED ORDER — IOPAMIDOL (ISOVUE-300) INJECTION 61%
INTRAVENOUS | Status: AC
  Filled 2018-04-08: qty 100

## 2018-04-10 DIAGNOSIS — H10413 Chronic giant papillary conjunctivitis, bilateral: Secondary | ICD-10-CM | POA: Diagnosis not present

## 2018-04-10 NOTE — Progress Notes (Signed)
[place]  [phone] [phone]  Clinic Follow Up Note   Patient Care Team: Eulas Post, MD as PCP - General (Family Medicine) Arta Silence, MD as Consulting Physician (Gastroenterology) Michael Boston, MD as Consulting Physician (General Surgery) Kyung Rudd, MD as Consulting Physician (Radiation Oncology) Truitt Merle, MD as Consulting Physician (Oncology)   Date of Service:  04/12/2018   CHIEF COMPLAINTS:  F/u rectal cancer  Oncology History   Cancer Staging Rectal adenocarcinoma [facility]) Staging form: Colon and Rectum, AJCC 8th Edition - Clinical stage from 07/06/2017: Stage IIIB (cT3, cN1, cM0) - Signed by [name], NP on 07/10/2017      Rectal adenocarcinoma s/p robotic LAR resection 10/10/2017   07/03/2017 Imaging    CT ABD PELVIS W CONTRAST IMPRESSION: Possible mass in the rectum compatible carcinoma. Endoscopy recommended for further evaluation. Otherwise negative.      07/03/2017 Tumor Marker    CEA 2.7      07/05/2017 Initial Biopsy    Rectum biopsy -invasive adenocarcinoma, poorly differentiated      07/05/2017 Procedure    Colonoscopy Per Dr. Paulita Fujita Findings: The perianal and digital rectal examinations were normal.  Internal hemorrhoids were found during retroflexion. The hemorrhoids were moderate. No additional abnormalities were found on retroflexion.  A fungating, sessile and ulcerated partially obstructing large mass was found in the recto-sigmoid colon.The mass was partially circumferential (involving two-thirds of the lumen circumference). Oozing was present. Area was successfully injected with 2 mL [place] ink for tattooing. This was biopsied with a cold forceps for histology.      07/05/2017 Initial Diagnosis    Rectal adenocarcinoma ([place])      07/06/2017 Imaging    Staging MRI ABD/PELVIS revealed clinical stage T3N1M0, IIIb       07/18/2017 - 08/24/2017 Radiation Therapy    Radaition with Dr  Lisbeth Renshaw     Radiation treatment dates:   07/18/2017 - 08/24/2017  Site/dose:   The rectum was treated to 45 Gy in 25 fractions of 1.8 Gy, followed by a 5.4 Gy boost in 3 fractions to yield a total dose of 50.4 Gy.  Narrative: The patient tolerated radiation treatment relatively well.   He had rectal bleeding with clots that was bright red after bowel movements. He reported loose stools that were painful. The skin to the radiation site was irritated, but no skin breakage was noted.       07/18/2017 Imaging    CT CHEST  IMPRESSION: Negative. No evidence of thoracic metastatic disease or other significant abnormality.       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  The Hereditary Gene Panel offered by [facility] includes sequencing and/or deletion duplication testing of the following 47 genes: APC, ATM, AXIN2, BARD1, BMPR1A, BRCA1, BRCA2, BRIP1, CDH1, CDK4, CDKN2A (p14ARF), CDKN2A (p16INK4a), CHEK2, CTNNA1, DICER1, EPCAM (Deletion/duplication testing only), GREM1 (promoter region deletion/duplication testing only), KIT, MEN1, MLH1, MSH2, MSH3, MSH6, MUTYH, NBN, NF1, NHTL1, PALB2, PDGFRA, PMS2, POLD1, POLE, PTEN, RAD50, RAD51C, RAD51D, SDHB, SDHC, SDHD, SMAD4, SMARCA4. STK11, TP53, TSC1, TSC2, and VHL.  The following genes were evaluated for sequence changes only: SDHA and HOXB13 c.251G>A variant only.  The report date is August 27, 2017.       10/10/2017 Surgery    XI ROBOTIC ASSISTED  LOWER ANTERIOR RESECTION AND RIGID PROCTOSCOPY bu Dr. Johney Maine and Dr. Dema Severin  10/10/17       10/10/2017 Pathology Results        Diagnosis 10/10/17  1. Colon, segmental resection for tumor, sigmoid SMALL FOCUS OF COLONIC GLANDS WITH HIGH GRADE DYSPLASIA POST NEOADJUVANT CHEMORADIATION THERAPY BIOPSY SITE WITH  CHRONIC INFLAMMATION NO RESIDULE INVASIVE CARCINOMA PRESENT TWENTY-TWO BENIGN LYMPH NODES (0/22) 2. Colon, resection margin (donut), final distal BENIGN COLONIC TISSUE      11/19/2017 - 03/04/2018 Adjuvant Chemotherapy    He started adjuvant 2000 mg Xeloda BID 2 weeks on 1 week off on 11/19/17 and completed 5 cycles on 03/04/18      04/08/2018 Imaging    CT AP W Contrast 04/08/18 IMPRESSION: 1. Postoperative and post treatment related changes in the low anatomic pelvis, as above. Today's study will serve as a new baseline for future follow-up examinations. At this time, there is no definitive evidence to strongly suggest local recurrence of disease or definite metastatic disease in the abdomen or pelvis. 2. Mild pancreatic ductal dilatation. This is of uncertain etiology and significance. No obstructing lesion identified in the pancreatic head. Correlation with nonemergent MRI of the abdomen with and without IV gadolinium with MRCP is suggested in the near future to evaluate for the etiology of this dilatation.       HISTORY OF PRESENTING ILLNESS:  Paul [name] 52 y.o. male is here because of newly diagnosed rectal cancer. He has a long standing history of loose stool and developed diarrhea within the last 4 weeks. He noticed 3 episodes in 1 day of blood on toilet paper which prompted him to go to the ER on 07/03/2017. A CT ABD and pelvis revealed a mass in the rectum compatible with carcinoma. Incidentally, he tested positive for shiga like toxin producing E coli while in the ED. He was discharged from ED with no new medication. Colonoscopy on 07/05/2017 per Dr. Paulita Fujita revealed a fungating, sessile and ulcerated partially obstructing large mass in the recto-sigmoid colon. The mass was partially circumferential involving two-thirds of the lumen circumference. Oozing was present. Biopsy on 07/05/17 confirmed invasive adenocarcinoma of the rectum. Staging MRI on 07/06/17 revealed extension  through muscularis propria 1-63m and nodes within the mesorectum and sigmoid meso colon measuring 963m indicating cT3N1Mo, stage IIIb. He was referred to GI surgery Dr. GrJohney Maineseen 07/09/2017.  Today he presents to clinic with a friend, referred by Dr. GrJohney MaineHe feels well overall with good appetite, stable weight, and no fatigue. His diarrhea has resolved but reports 2-3 loose stools per day, associated with cramping. No nausea, vomiting, or abdominal pain.   CURRENT THERAPY: Surveillance    INTERIM HISTORY:  MaTaniela Feltuseturns to follow up for her rectal cancer. He presents to the clinic today by himself. He notes he is doing well overall. He recently met with the dietician and he tried to follow her recommendations. He does not have diarrhea but frequent BM, he goes at least 3-4 times a day. He is apprehensive to take lomotil due to constipation. He notes his gas in not improving as well. He notes he is able to still eat adequately. He notes having dry skin which he wanted if he needed topical medicine for.    MEDICAL HISTORY:  Past Medical History:  Diagnosis Date  . Allergy    grass, dander  . Essential hypertension 07/03/2017  . Family history of prostate cancer   . GERD (gastroesophageal reflux disease)   .  History of meningitis   . Hypertension   . 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 COLONOSCOPY WITH PROPOFOL Left 07/05/2017   Procedure: COLONOSCOPY WITH PROPOFOL;  Surgeon: Arta Silence, MD;  Location: [facility];  Service: Endoscopy;  Laterality: Left;  . PROCTOSCOPY N/A 10/10/2017   Procedure: RIGID PROCTOSCOPY;  Surgeon: Michael Boston, MD;  Location: [facility];  Service: General;  Laterality: N/A;  . XI ROBOTIC ASSISTED LOWER ANTERIOR RESECTION N/A 10/10/2017   Procedure: XI ROBOTIC ASSISTED LOWER ANTERIOR  RESECTION;  Surgeon: Michael Boston, MD;  Location: [facility];  Service: General;  Laterality: N/A;   SOCIAL HISTORY: Social History   Socioeconomic History  . Marital status: Married    Spouse name: Not on file  . Number of children: Not on file  . Years of education: Not on file  . Highest education level: Not on file  Occupational History  . Not on file  Social Needs  . Financial resource strain: Not on file  . Food insecurity:    Worry: Not on file    Inability: Not on file  . Transportation needs:    Medical: Not on file    Non-medical: Not on file  Tobacco Use  . Smoking status: Never Smoker  . Smokeless tobacco: Never Used  Substance and Sexual Activity  . Alcohol use: No  . Drug use: No  . Sexual activity: Not on file  Lifestyle  . Physical activity:    Days per week: Not on file    Minutes per session: Not on file  . Stress: Not on file  Relationships  . Social connections:    Talks on phone: Not on file    Gets together: Not on file    Attends religious service: Not on file    Active member of club or organization: Not on file    Attends meetings of clubs or organizations: Not on file    Relationship status: Not on file  . Intimate partner violence:    Fear of current or ex partner: Not on file    Emotionally abused: Not on file    Physically abused: Not on file    Forced sexual activity: Not on file  Other Topics Concern  . Not on file  Social History Narrative   Pronouced "KA-noo--[place]Y"   Originally from [place], [place], [place]   Patient is married with 3 children, ages 60, 65, and 84. He lives in a home with his wife and kids. 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 He works 3-11:30 pm shift; he has morning/daytime availability for appointments and treatments. He prefers to work during anticipated treatment but can apply for [employer]. He will send  appropriate paperwork when the time comes.   FAMILY HISTORY: Family History  Problem Relation Age of Onset  . Hypertension Mother   . Asthma Father   . Cancer Father        Prostate  . Asthma Son   . Asthma Son   . Asthma Daughter   . Dementia Maternal Grandmother    ALLERGIES:  is allergic to chloroquine.  MEDICATIONS:  Current Outpatient Medications  Medication Sig Dispense Refill  . amLODipine (NORVASC) 5 MG tablet TAKE 1 TABLET DAILY 90 tablet 1  . calcium  carbonate (OS-CAL - DOSED IN MG OF ELEMENTAL CALCIUM) 1250 (500 Ca) MG tablet Take 1 tablet by mouth daily as needed (for bones).    . Calcium Citrate-Vitamin D (CALCIUM CITRATE+D3 PETITES PO) Take 1 tablet by mouth daily.    . cetirizine (ZYRTEC) 10 MG tablet Take 10 mg by mouth daily as needed for allergies.    Marland Kitchen omeprazole (PRILOSEC) 40 MG capsule Take 40 mg by mouth daily as needed (heart burn).     . ferrous sulfate 325 (65 FE) MG EC tablet Take 1 tablet (325 mg total) by mouth daily. (Patient not taking: Reported on 04/12/2018) 30 tablet 1  . GARLIC PO Take 1 capsule by mouth daily.     . Multiple Vitamin (MULTIVITAMIN WITH MINERALS) TABS tablet Take 1 tablet by mouth daily.    . naproxen (NAPROSYN) 500 MG tablet Take 1 tablet (500 mg total) by mouth every 12 (twelve) hours as needed for mild pain or moderate pain. (Patient not taking: Reported on 04/12/2018) 30 tablet 1  . ondansetron (ZOFRAN) 8 MG tablet Take 1 tablet (8 mg total) by mouth every 8 (eight) hours as needed for nausea or vomiting. (Patient not taking: Reported on 02/08/2018) 30 tablet 3  . prochlorperazine (COMPAZINE) 10 MG tablet Take 1 tablet (10 mg total) by mouth every 6 (six) hours as needed for nausea or vomiting. (Patient not taking: Reported on 04/12/2018) 30 tablet 2  . traMADol (ULTRAM) 50 MG tablet Take 1-2 tablets (50-100 mg total) by mouth every 6 (six) hours as needed for moderate pain or severe pain. (Patient not taking: Reported on 04/12/2018) 30  tablet 0   No current facility-administered medications for this visit.    REVIEW OF SYSTEMS:   Constitutional: Denies fatigue, fevers, chills, abnormal night sweats, or weight loss. (+) adequate appetite and eating Eyes: Denies blurriness of vision, double vision or watery eyes Ears, nose, mouth, throat, and face: Denies mucositis or sore throat Respiratory: Denies cough, dyspnea or wheezes Cardiovascular: Denies palpitation, chest discomfort or lower extremity swelling Gastrointestinal:  no nausea, vomiting (+) frequent bowel movements (3-4/day)  (+) gas/bloating (+) occasional constipation Skin: Denies abnormal skin rashes on the feet (+) dryness of hands and feet (+) peeling of skin on hands Lymphatics: Denies new lymphadenopathy or easy bruising Neurological:Denies numbness, tingling or new weaknesses Behavioral/Psych: Mood is stable, no new changes  All other systems were reviewed with the patient and are negative.  PHYSICAL EXAMINATION:  ECOG PERFORMANCE STATUS: 1  Vitals:   04/12/18 0841  BP: (!) 154/95  Pulse: 69  Resp: 18  Temp: 98.2 F (36.8 C)  SpO2: 100%   Filed Weights   04/12/18 0841  Weight: 194 lb 9.6 oz (88.3 kg)     GENERAL:alert, no distress and comfortable SKIN: skin color, texture, turgor are normal, no rashes or significant lesions, except skin hyperpigmentation and mild peeling on bilateral hands and feet EYES: normal, conjunctiva are pink and non-injected, sclera clear OROPHARYNX:no exudate, no erythema and lips, buccal mucosa, and tongue normal  NECK: supple, thyroid normal size, non-tender, without nodularity LYMPH:  no palpable cervical or supraclavicular lymphadenopathy  LUNGS: clear to auscultation bilaterally with normal breathing effort HEART: regular rate & rhythm, S1 and S2 present; no murmurs and no lower extremity edema ABDOMEN:abdomen soft, flat, non-tender and normal bowel sounds. No palpable hepatosplenomegaly or masses. Low anterior  incision and laparoscopic incisions have both healed very well.  MSK:no cyanosis of digits and no clubbing  PSYCH: alert & oriented  x 3 with fluent speech NEURO: no focal motor/sensory deficits RECTAL: exam was deferred   LABORATORY DATA:  I have reviewed the data as listed CBC Latest Ref Rng & Units 04/08/2018 02/08/2018 01/18/2018  WBC 4.0 - 10.3 K/uL 2.4(L) 2.9(L) 3.1(L)  Hemoglobin 13.0 - 17.1 g/dL 12.7(L) 12.4(L) 13.2  Hematocrit 38.4 - 49.9 % 37.9(L) 36.3(L) 40.5  Platelets 140 - 400 K/uL 175 157 153   CMP Latest Ref Rng & Units 04/08/2018 02/08/2018 01/18/2018  Glucose 70 - 140 mg/dL 100 120 104  BUN 7 - 26 mg/dL '9 12 9  ' Creatinine 0.70 - 1.30 mg/dL 0.80 0.85 0.90  Sodium 136 - 145 mmol/L 136 138 141  Potassium 3.5 - 5.1 mmol/L 3.8 3.8 4.3  Chloride 98 - 109 mmol/L 103 105 103  CO2 22 - 29 mmol/L 25 27 30(H)  Calcium 8.4 - 10.4 mg/dL 8.9 9.3 9.7  Total Protein 6.4 - 8.3 g/dL 6.9 7.1 7.4  Total Bilirubin 0.2 - 1.2 mg/dL 0.5 0.9 0.9  Alkaline Phos 40 - 150 U/L 82 88 85  AST 5 - 34 U/L 34 26 22  ALT 0 - 55 U/L '22 23 21   ' PATHOLOGY RESULTS:  Diagnosis 10/10/17  1. Colon, segmental resection for tumor, sigmoid SMALL FOCUS OF COLONIC GLANDS WITH HIGH GRADE DYSPLASIA POST NEOADJUVANT CHEMORADIATION THERAPY BIOPSY SITE WITH CHRONIC INFLAMMATION NO RESIDULE INVASIVE CARCINOMA PRESENT TWENTY-TWO BENIGN LYMPH NODES (0/22) 2. Colon, resection margin (donut), final distal BENIGN COLONIC TISSUE Microscopic Comment 1. COLON AND RECTUM (INCLUDING TRANS-ANAL RESECTION): Specimen: Sigmoid Procedure: Segmental resection Tumor site: Posterior wall of the proximal rectum Specimen integrity: Intact Macroscopic intactness of mesorectum: Not applicable: X Complete: NA Near complete: NA Incomplete: NA Cannot be determined (specify): NA Macroscopic tumor perforation: No residual tumor identified Invasive tumor: Maximum size: No residual tumor Histologic type(s): adenocarcinoma (biopsy  [id number]) Histologic grade and differentiation: G3 G1: well differentiated/low grade G2: moderately differentiated/low grade G3: poorly differentiated/high grade G4: undifferentiated/high grade 1 of 3 FINAL for CARMICHAEL, BURDETTE [phone]) Microscopic Comment(continued) Type of polyp in which invasive carcinoma arose: Tubular adenoma Microscopic extension of invasive tumor: No residual invasive carcinoma Lymph-Vascular invasion: Negative Peri-neural invasion: Negative Tumor deposit(s) (discontinuous extramural extension): negative Resection margins: Proximal margin: Negative Distal margin: Negative Circumferential (radial) (posterior ascending, posterior descending; lateral and posterior mid-rectum; and entire lower 1/3 rectum):Negative Mesenteric margin (sigmoid and transverse): negative Distance closest margin (if all above margins negative): NA Trans-anal resection margins only: Deep margin: NA Mucosal Margin: NA Distance closest mucosal margin (if negative): NA Treatment effect (neo-adjuvant therapy): Complete response Additional polyp(s): Negative Non-neoplastic findings: unremarkable Lymph nodes: number examined 22; number positive: 0 Pathologic Staging: ypTis, ypN0, ypMx Ancillary studies: per request    Diagnosis 07/05/17 Rectum, biopsy - INVASIVE ADENOCARCINOMA. - SEE COMMENT. Microscopic Comment The adenocarcinoma is poorly differentiated. Dr. Vicente Males has reviewed the case and concurs with this interpretation. Dr. Paulita Fujita was paged on 07/06/17. (JBK:gt, 07/06/17)  PROCEDURES  COLONOSCOPY: Dr. Paulita Fujita 07/05/2017 - Internal hemorrhoids. - Likely malignant partially obstructing tumor in the recto-sigmoid colon. Injected. Biopsied.   RADIOGRAPHIC STUDIES: I have personally reviewed the radiological images as listed and agreed with the findings in the report. Ct Abdomen Pelvis W Contrast  Result Date: 04/08/2018 CLINICAL DATA:  52 year old male with history  of rectal cancer diagnosed in September 2018, status post surgical resection followed by oral chemotherapy and radiation therapy now complete. Follow-up evaluation. EXAM: CT ABDOMEN AND PELVIS WITH CONTRAST TECHNIQUE: Multidetector CT imaging of the abdomen  and pelvis was performed using the standard protocol following bolus administration of intravenous contrast. CONTRAST:  188m ISOVUE-300 IOPAMIDOL (ISOVUE-300) INJECTION 61% COMPARISON:  CT the abdomen and pelvis 07/03/2017. MRI of the pelvis 07/06/2017. FINDINGS: Lower chest: Unremarkable. Hepatobiliary: No suspicious cystic or solid hepatic lesions. No intra or extrahepatic biliary ductal dilatation. Gallbladder is normal in appearance. Pancreas: No pancreatic mass. Pancreatic duct is dilated measuring 7 mm in the pancreatic head. No pancreatic or peripancreatic fluid or inflammatory changes. Spleen: Unremarkable. Adrenals/Urinary Tract: Bilateral kidneys and bilateral adrenal glands are normal in appearance. No hydroureteronephrosis. Urinary bladder is normal in appearance. Stomach/Bowel: Normal appearance of the stomach. No pathologic dilatation of small bowel or colon. Postoperative changes of partial Proctor colectomy are noted in the low anatomic pelvis. No definite mass adjacent to the suture line identified. There is some mild thickening of the distal rectum, best appreciated on axial image 76 of series 2, as well as some diffuse thickening of the presacral soft tissues, all of which is likely attributable to postradiation changes. Normal appendix. Vascular/Lymphatic: No significant atherosclerotic disease, aneurysm or dissection noted in the abdominal or pelvic vasculature. No lymphadenopathy noted in the abdomen or pelvis. Reproductive: Prostate gland and seminal vesicles are unremarkable in appearance. Other: No significant volume of ascites.  No pneumoperitoneum. Musculoskeletal: There are no aggressive appearing lytic or blastic lesions noted in the  visualized portions of the skeleton. IMPRESSION: 1. Postoperative and post treatment related changes in the low anatomic pelvis, as above. Today's study will serve as a new baseline for future follow-up examinations. At this time, there is no definitive evidence to strongly suggest local recurrence of disease or definite metastatic disease in the abdomen or pelvis. 2. Mild pancreatic ductal dilatation. This is of uncertain etiology and significance. No obstructing lesion identified in the pancreatic head. Correlation with nonemergent MRI of the abdomen with and without IV gadolinium with MRCP is suggested in the near future to evaluate for the etiology of this dilatation. Electronically Signed   By: DVinnie LangtonM.D.   On: 04/08/2018 08:54    ASSESSMENT & PLAN:  Paul Bangurais a 52y.o.  with history of allergies, HTN, GERD, and chronic low back pain. He presents with rectal cancer diagnosed 07/05/2017.  1. Poorly differentiated invasive rectal adenocarcinoma, cT3N1M0, stage IIIb, ypT0N0 -I previously reviewed his biopsy and CT, MRI scan results with the patient and his friend in detail.  -His tumor is located in the rectosigmoid colon. -We discussed he has likely locally advanced stage IIIb disease based on tumor size, invasion into the muscularis, and positive lymph node, no distant metastasis on day CT and MRI of abdomen and pelvis. -CT chest was negative for metastasis -He saw Dr. GJohney Maineon 07/09/17 who recommended neoadjuvant chemoradiation to help shrink tumor followed by surgical low anterior resection with possible adjuvant chemotherapy after surgery. We are in agreement with this plan. -He underwent concurrent chemoRT with Xeloda on 07/18/17-08/24/17, with mild nausea and rectal bleeding and residual effects on his urine output flow and rectal pain.  -He underwent low anterior resection surgery on 10/10/17, his surgical pathology showed complete response to neoadjuvant chemoradiation  therapy. No residual tumor and 22 lymph nodes were negative. -He started adjuvant 2000 mg Xeloda BID 2 weeks on 1 week off on 11/19/17 and completed 5 cycles on 03/04/18. He tolerated well overall, with mild to moderate diarrhea, mild hand-foot syndrome  -We discussed his CT AP from 04/08/18 shows surgical changes with no evidence of disease.  He does have mild pancreatic duct dilation, will monitor for now.  -Labs reviewed from this week, he has moderately low blood counts from chemo and Ferritin at 18, with normal CMP and CEA.  He will restart oral iron.  -He plans to have surveillance Colonoscopy in 09/2018 with Dr. Paulita Fujita.  -continue cancer surveillance. F/u in 3 months then every 4 months for 1 year then every 6 months.    2. Anemia; iron deficiency  -He previously had rectal bleeding that recently stopped.  -Will check iron studies periodically and consider IV Feraheme if he does not respond to oral iron therapy.  -HG at 12.7 and Ferritin 18 this week. I recommend him to restart oral iron.   3. 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    4. frequent bowel movement, Gas -He has been having frequent bowel movement since his surgery, loose  -Pt was seen in the ED on 11/20/17 for bowel dysfunction. He appeared to have rectal tissue prolapse. The tissue was removed and he had no following symptoms.  -I previously suggested he use Gas X or maalox as needed and avoid dairy. Gas X and diet change has not been helping lately.  -He continue to followup with nutritionist.  -I recommend he use half dose immodium the future if needed for his frequent loose bowel movement -I recommend he see PT for pelvic floor exercises to help his bowel  control. He is interested. I will send referral today.  5. HTN  -Due to persistent elevated BP in clinic, I previously recommended he see his PCP as his HTN medication may need to be adjusted.  -he is currently taking 5 mg amlodipine -I previously encouraged him to check his BP at home and document the reading  -BP at 154/95 today (04/12/18), I encouraged him to follow-up with his primary care physician  PLAN: -lab and CT AP reviewed with pt,  NED -Lab and f/u in 3 months  -restart oral iron pill  -PT referral   Orders Placed This Encounter  Procedures  . Ambulatory referral to Physical Therapy    Referral Priority:   Routine    Referral Type:   Physical Medicine    Referral Reason:   Specialty Services Required    Requested Specialty:   Physical Therapy    Number of Visits Requested:   1   All questions were answered. The patient knows to call the clinic with any problems, questions or concerns. I spent 20 minutes counseling the patient face to face. The total time spent in the appointment was 25 minutes.  Oneal Deputy, am acting as scribe for Truitt Merle, MD.   I have reviewed the above documentation for accuracy and completeness, and I agree with the above.    Truitt Merle, MD 04/12/2018 9:22 AM

## 2018-04-12 ENCOUNTER — Telehealth: Payer: Self-pay | Admitting: Hematology

## 2018-04-12 ENCOUNTER — Inpatient Hospital Stay (HOSPITAL_BASED_OUTPATIENT_CLINIC_OR_DEPARTMENT_OTHER): Payer: BLUE CROSS/BLUE SHIELD | Admitting: Hematology

## 2018-04-12 ENCOUNTER — Encounter: Payer: Self-pay | Admitting: Hematology

## 2018-04-12 VITALS — BP 154/95 | HR 69 | Temp 98.2°F | Resp 18 | Ht 73.0 in | Wt 194.6 lb

## 2018-04-12 DIAGNOSIS — D509 Iron deficiency anemia, unspecified: Secondary | ICD-10-CM

## 2018-04-12 DIAGNOSIS — C20 Malignant neoplasm of rectum: Secondary | ICD-10-CM | POA: Diagnosis not present

## 2018-04-12 DIAGNOSIS — I10 Essential (primary) hypertension: Secondary | ICD-10-CM

## 2018-04-12 DIAGNOSIS — R197 Diarrhea, unspecified: Secondary | ICD-10-CM

## 2018-04-12 DIAGNOSIS — Z79899 Other long term (current) drug therapy: Secondary | ICD-10-CM | POA: Diagnosis not present

## 2018-04-12 NOTE — Telephone Encounter (Signed)
Scheduled appt per 6/21 los - gave patient AVS and calender per los. - PT already contact patient per referral note.

## 2018-07-05 ENCOUNTER — Telehealth: Payer: Self-pay | Admitting: Hematology

## 2018-07-05 NOTE — Telephone Encounter (Signed)
YF PAL 9/20 - moved appointments to 10/2. Schedule mailed.

## 2018-07-12 ENCOUNTER — Ambulatory Visit: Payer: BLUE CROSS/BLUE SHIELD | Admitting: Hematology

## 2018-07-12 ENCOUNTER — Other Ambulatory Visit: Payer: BLUE CROSS/BLUE SHIELD

## 2018-07-21 NOTE — Progress Notes (Signed)
[place]  [phone] [phone]  Clinic Follow Up Note   Patient Care Team: Eulas Post, MD as PCP - General (Family Medicine) Arta Silence, MD as Consulting Physician (Gastroenterology) Michael Boston, MD as Consulting Physician (General Surgery) Kyung Rudd, MD as Consulting Physician (Radiation Oncology) Truitt Merle, MD as Consulting Physician (Oncology)   Date of Service:  07/24/2018   CHIEF COMPLAINTS:  F/u rectal cancer  Oncology History   Cancer Staging Rectal adenocarcinoma [facility]) Staging form: Colon and Rectum, AJCC 8th Edition - Clinical stage from 07/06/2017: Stage IIIB (cT3, cN1, cM0) - Signed by [name], NP on 07/10/2017      Rectal adenocarcinoma s/p robotic LAR resection 10/10/2017   07/03/2017 Imaging    CT ABD PELVIS W CONTRAST IMPRESSION: Possible mass in the rectum compatible carcinoma. Endoscopy recommended for further evaluation. Otherwise negative.    07/03/2017 Tumor Marker    CEA 2.7    07/05/2017 Initial Biopsy    Rectum biopsy -invasive adenocarcinoma, poorly differentiated    07/05/2017 Procedure    Colonoscopy Per Dr. Paulita Fujita Findings: The perianal and digital rectal examinations were normal.  Internal hemorrhoids were found during retroflexion. The hemorrhoids were moderate. No additional abnormalities were found on retroflexion.  A fungating, sessile and ulcerated partially obstructing large mass was found in the recto-sigmoid colon.The mass was partially circumferential (involving two-thirds of the lumen circumference). Oozing was present. Area was successfully injected with 2 mL [place] ink for tattooing. This was biopsied with a cold forceps for histology.    07/05/2017 Initial Diagnosis    Rectal adenocarcinoma ([place])    07/06/2017 Imaging    Staging MRI ABD/PELVIS revealed clinical stage T3N1M0, IIIb     07/18/2017 - 08/24/2017 Radiation Therapy    Radaition with Dr Lisbeth Renshaw       Radiation treatment dates:   07/18/2017 - 08/24/2017  Site/dose:   The rectum was treated to 45 Gy in 25 fractions of 1.8 Gy, followed by a 5.4 Gy boost in 3 fractions to yield a total dose of 50.4 Gy.  Narrative: The patient tolerated radiation treatment relatively well.   He had rectal bleeding with clots that was bright red after bowel movements. He reported loose stools that were painful. The skin to the radiation site was irritated, but no skin breakage was noted.     07/18/2017 Imaging    CT CHEST  IMPRESSION: Negative. No evidence of thoracic metastatic disease or other significant abnormality.     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  The Hereditary Gene Panel offered by [facility] includes sequencing and/or deletion duplication testing of the following 47 genes: APC, ATM, AXIN2, BARD1, BMPR1A, BRCA1, BRCA2, BRIP1, CDH1, CDK4, CDKN2A (p14ARF), CDKN2A (p16INK4a), CHEK2, CTNNA1, DICER1, EPCAM (Deletion/duplication testing only), GREM1 (promoter region deletion/duplication testing only), KIT, MEN1, MLH1, MSH2, MSH3, MSH6, MUTYH, NBN, NF1, NHTL1, PALB2, PDGFRA, PMS2, POLD1, POLE, PTEN, RAD50, RAD51C, RAD51D, SDHB, SDHC, SDHD, SMAD4, SMARCA4. STK11, TP53, TSC1, TSC2, and VHL.  The following genes were evaluated for sequence changes only: SDHA and HOXB13 c.251G>A variant only.  The report date is August 27, 2017.     10/10/2017 Surgery    XI ROBOTIC ASSISTED LOWER ANTERIOR RESECTION AND RIGID PROCTOSCOPY bu Dr. GrJohney Mainend Dr. WhDema Severin12/19/18     10/10/2017  Pathology Results        Diagnosis 10/10/17  1. Colon, segmental resection for tumor, sigmoid SMALL FOCUS OF COLONIC GLANDS WITH HIGH GRADE DYSPLASIA POST NEOADJUVANT CHEMORADIATION THERAPY BIOPSY SITE WITH CHRONIC INFLAMMATION NO  RESIDULE INVASIVE CARCINOMA PRESENT TWENTY-TWO BENIGN LYMPH NODES (0/22) 2. Colon, resection margin (donut), final distal BENIGN COLONIC TISSUE    11/19/2017 - 03/04/2018 Adjuvant Chemotherapy    He started adjuvant 2000 mg Xeloda BID 2 weeks on 1 week off on 11/19/17 and completed 5 cycles on 03/04/18    04/08/2018 Imaging    CT AP W Contrast 04/08/18 IMPRESSION: 1. Postoperative and post treatment related changes in the low anatomic pelvis, as above. Today's study will serve as a new baseline for future follow-up examinations. At this time, there is no definitive evidence to strongly suggest local recurrence of disease or definite metastatic disease in the abdomen or pelvis. 2. Mild pancreatic ductal dilatation. This is of uncertain etiology and significance. No obstructing lesion identified in the pancreatic head. Correlation with nonemergent MRI of the abdomen with and without IV gadolinium with MRCP is suggested in the near future to evaluate for the etiology of this dilatation.     HISTORY OF PRESENTING ILLNESS:  Paul [name] 52 y.o. male is here because of newly diagnosed rectal cancer. He has a long standing history of loose stool and developed diarrhea within the last 4 weeks. He noticed 3 episodes in 1 day of blood on toilet paper which prompted him to go to the ER on 07/03/2017. A CT ABD and pelvis revealed a mass in the rectum compatible with carcinoma. Incidentally, he tested positive for shiga like toxin producing E coli while in the ED. He was discharged from ED with no new medication. Colonoscopy on 07/05/2017 per Dr. Paulita Fujita revealed a fungating, sessile and ulcerated partially obstructing large mass in the recto-sigmoid colon. The mass was partially circumferential involving two-thirds of the lumen circumference. Oozing was present. Biopsy on 07/05/17 confirmed invasive adenocarcinoma of the rectum. Staging MRI on 07/06/17 revealed extension through muscularis propria 1-29m and  nodes within the mesorectum and sigmoid meso colon measuring 945m indicating cT3N1Mo, stage IIIb. He was referred to GI surgery Dr. GrJohney Maineseen 07/09/2017.  Today he presents to clinic with a friend, referred by Dr. GrJohney MaineHe feels well overall with good appetite, stable weight, and no fatigue. His diarrhea has resolved but reports 2-3 loose stools per day, associated with cramping. No nausea, vomiting, or abdominal pain.   CURRENT THERAPY: Surveillance    INTERIM HISTORY:  Paul Clayburneturns to follow up for her rectal cancer. Today, he is here alone at the clinic. He is complaining of not being able to empty his bowel completely. He has some hard stools and is bloated. No nausea. He is gaining weight and eating well.  He has not taken his BP medications.   MEDICAL HISTORY:  Past Medical History:  Diagnosis Date  . Allergy    grass, dander  . Essential hypertension 07/03/2017  . Family history of prostate cancer   . GERD (gastroesophageal reflux disease)   . History of meningitis   . Hypertension   . 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 COLONOSCOPY WITH PROPOFOL Left 07/05/2017   Procedure: COLONOSCOPY WITH PROPOFOL;  Surgeon: OuArta SilenceMD;  [facility] Service: Endoscopy;  Laterality: Left;  . PROCTOSCOPY  N/A 10/10/2017   Procedure: RIGID PROCTOSCOPY;  Surgeon: Michael Boston, MD;  Location: [facility];  Service: General;  Laterality: N/A;  . XI ROBOTIC ASSISTED LOWER ANTERIOR RESECTION N/A 10/10/2017   Procedure: XI ROBOTIC ASSISTED LOWER ANTERIOR RESECTION;  Surgeon: Michael Boston, MD;  Location: [facility];  Service: General;  Laterality: N/A;   SOCIAL HISTORY: Social History   Socioeconomic History  . Marital status: Married    Spouse name: Not on file  . Number of children: Not on file  . Years  of education: Not on file  . Highest education level: Not on file  Occupational History  . Not on file  Social Needs  . Financial resource strain: Not on file  . Food insecurity:    Worry: Not on file    Inability: Not on file  . Transportation needs:    Medical: Not on file    Non-medical: Not on file  Tobacco Use  . Smoking status: Never Smoker  . Smokeless tobacco: Never Used  Substance and Sexual Activity  . Alcohol use: No  . Drug use: No  . Sexual activity: Not on file  Lifestyle  . Physical activity:    Days per week: Not on file    Minutes per session: Not on file  . Stress: Not on file  Relationships  . Social connections:    Talks on phone: Not on file    Gets together: Not on file    Attends religious service: Not on file    Active member of club or organization: Not on file    Attends meetings of clubs or organizations: Not on file    Relationship status: Not on file  . Intimate partner violence:    Fear of current or ex partner: Not on file    Emotionally abused: Not on file    Physically abused: Not on file    Forced sexual activity: Not on file  Other Topics Concern  . Not on file  Social History Narrative   Pronouced "KA-noo--TAY"   Originally from [place], [place], [place]   Patient is married with 3 children, ages 65, 41, and 30. He lives in a home with his wife and kids. 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 He works 3-11:30 pm shift; he has morning/daytime availability for appointments and treatments. He prefers to work during anticipated treatment but can apply for [employer]. He will send appropriate paperwork when the time comes.   FAMILY HISTORY: Family History  Problem Relation Age of Onset  . Hypertension Mother   . Asthma Father   . Cancer Father        Prostate  . Asthma Son   . Asthma Son   . Asthma Daughter   . Dementia Maternal  Grandmother    ALLERGIES:  is allergic to chloroquine.  MEDICATIONS:  Current Outpatient Medications  Medication Sig Dispense Refill  . amLODipine (NORVASC) 5 MG tablet TAKE 1 TABLET DAILY 90 tablet 1  . calcium carbonate (OS-CAL - DOSED IN MG OF ELEMENTAL CALCIUM) 1250 (500 Ca) MG tablet Take 1 tablet by mouth daily as needed (for bones).    . Calcium Citrate-Vitamin D (CALCIUM CITRATE+D3 PETITES PO) Take 1 tablet by mouth daily.    . cetirizine (ZYRTEC) 10 MG tablet Take 10 mg by mouth daily as needed for allergies.    Marland Kitchen GARLIC PO Take 1  capsule by mouth daily.     . Multiple Vitamin (MULTIVITAMIN WITH MINERALS) TABS tablet Take 1 tablet by mouth daily.    . naproxen (NAPROSYN) 500 MG tablet Take 1 tablet (500 mg total) by mouth every 12 (twelve) hours as needed for mild pain or moderate pain. 30 tablet 1  . omeprazole (PRILOSEC) 40 MG capsule Take 40 mg by mouth daily as needed (heart burn).     . ondansetron (ZOFRAN) 8 MG tablet Take 1 tablet (8 mg total) by mouth every 8 (eight) hours as needed for nausea or vomiting. 30 tablet 3  . prochlorperazine (COMPAZINE) 10 MG tablet Take 1 tablet (10 mg total) by mouth every 6 (six) hours as needed for nausea or vomiting. 30 tablet 2  . traMADol (ULTRAM) 50 MG tablet Take 1-2 tablets (50-100 mg total) by mouth every 6 (six) hours as needed for moderate pain or severe pain. 30 tablet 0   No current facility-administered medications for this visit.    REVIEW OF SYSTEMS:   Constitutional: Denies fatigue, fevers, chills, abnormal night sweats,. (+) adequate appetite and weight gain Eyes: Denies blurriness of vision, double vision or watery eyes Ears, nose, mouth, throat, and face: Denies mucositis or sore throat Respiratory: Denies cough, dyspnea or wheezes Cardiovascular: Denies palpitation, chest discomfort or lower extremity swelling Gastrointestinal:  no nausea, vomiting  (+) gas/bloating (+) occasional constipation Skin: Denies abnormal skin  rashes on the feet Lymphatics: Denies new lymphadenopathy or easy bruising Neurological:Denies numbness, tingling or new weaknesses Behavioral/Psych: Mood is stable, no new changes  All other systems were reviewed with the patient and are negative.  PHYSICAL EXAMINATION: ECOG PERFORMANCE STATUS: 0  Vitals:   07/24/18 0920  BP: (!) 146/98  Pulse: 60  Resp: 18  Temp: 98.2 F (36.8 C)  SpO2: 100%   Filed Weights   07/24/18 0920  Weight: 199 lb 4.8 oz (90.4 kg)     GENERAL:alert, no distress and comfortable SKIN: skin color, texture, turgor are normal, no rashes or significant lesions, except skin hyperpigmentation and mild peeling on bilateral hands and feet EYES: normal, conjunctiva are pink and non-injected, sclera clear OROPHARYNX:no exudate, no erythema and lips, buccal mucosa, and tongue normal  NECK: supple, thyroid normal size, non-tender, without nodularity LYMPH:  no palpable cervical or supraclavicular lymphadenopathy  LUNGS: clear to auscultation bilaterally with normal breathing effort HEART: regular rate & rhythm, S1 and S2 present; no murmurs and no lower extremity edema ABDOMEN:abdomen soft, flat, non-tender and normal bowel sounds. No palpable hepatosplenomegaly or masses. Low anterior incision and laparoscopic incisions have both healed very well.  MSK:no cyanosis of digits and no clubbing  PSYCH: alert & oriented x 3 with fluent speech NEURO: no focal motor/sensory deficits RECTAL: No plalpable masses or bleeding. No inguinal adenopathy.   LABORATORY DATA:  I have reviewed the data as listed CBC Latest Ref Rng & Units 07/24/2018 04/08/2018 02/08/2018  WBC 4.0 - 10.3 K/uL 2.5(L) 2.4(L) 2.9(L)  Hemoglobin 13.0 - 17.1 g/dL 14.0 12.7(L) 12.4(L)  Hematocrit 38.4 - 49.9 % 41.3 37.9(L) 36.3(L)  Platelets 140 - 400 K/uL 166 175 157   CMP Latest Ref Rng & Units 07/24/2018 04/08/2018 02/08/2018  Glucose 70 - 99 mg/dL 93 100 120  BUN 6 - 20 mg/dL _0 Creatinine 0.61  - 1.24 mg/dL 0.88 0.80 0.85  Sodium 135 - 145 mmol/L 141 136 138  Potassium 3.5 - 5.1 mmol/L 4.1 3.8 3.8  Chloride 98 - 111 mmol/L 104 103  105  CO2 22 - 32 mmol/L _0 Calcium 8.9 - 10.3 mg/dL 9.2 8.9 9.3  Total Protein 6.5 - 8.1 g/dL 7.5 6.9 7.1  Total Bilirubin 0.3 - 1.2 mg/dL 0.7 0.5 0.9  Alkaline Phos 38 - 126 U/L 82 82 88  AST 15 - 41 U/L 27 34 26  ALT 0 - 44 U/L _1 Tumor Markers CEA Results for MIKKO, LEWELLEN (MRN [id number]) as of 07/21/2018 14:04  Ref. Range 10/25/2017 09:27 01/18/2018 08:41 02/08/2018 08:21 02/22/2018 09:00 04/08/2018 09:03  CEA ([facility]) Latest Ref Range: 0.00 - 5.00 ng/mL 2.41 4.19 4.56  4.39    PATHOLOGY RESULTS:  Diagnosis 10/10/17  1. Colon, segmental resection for tumor, sigmoid SMALL FOCUS OF COLONIC GLANDS WITH HIGH GRADE DYSPLASIA POST NEOADJUVANT CHEMORADIATION THERAPY BIOPSY SITE WITH CHRONIC INFLAMMATION NO RESIDULE INVASIVE CARCINOMA PRESENT TWENTY-TWO BENIGN LYMPH NODES (0/22) 2. Colon, resection margin (donut), final distal BENIGN COLONIC TISSUE Microscopic Comment 1. COLON AND RECTUM (INCLUDING TRANS-ANAL RESECTION): Specimen: Sigmoid Procedure: Segmental resection Tumor site: Posterior wall of the proximal rectum Specimen integrity: Intact Macroscopic intactness of mesorectum: Not applicable: X Complete: NA Near complete: NA Incomplete: NA Cannot be determined (specify): NA Macroscopic tumor perforation: No residual tumor identified Invasive tumor: Maximum size: No residual tumor Histologic type(s): adenocarcinoma (biopsy [id number]) Histologic grade and differentiation: G3 G1: well differentiated/low grade G2: moderately differentiated/low grade G3: poorly differentiated/high grade G4: undifferentiated/high grade 1 of 3 FINAL for RESHARD, GUILLET [phone]) Microscopic Comment(continued) Type of polyp in which invasive carcinoma arose: Tubular adenoma Microscopic extension of invasive tumor: No residual  invasive carcinoma Lymph-Vascular invasion: Negative Peri-neural invasion: Negative Tumor deposit(s) (discontinuous extramural extension): negative Resection margins: Proximal margin: Negative Distal margin: Negative Circumferential (radial) (posterior ascending, posterior descending; lateral and posterior mid-rectum; and entire lower 1/3 rectum):Negative Mesenteric margin (sigmoid and transverse): negative Distance closest margin (if all above margins negative): NA Trans-anal resection margins only: Deep margin: NA Mucosal Margin: NA Distance closest mucosal margin (if negative): NA Treatment effect (neo-adjuvant therapy): Complete response Additional polyp(s): Negative Non-neoplastic findings: unremarkable Lymph nodes: number examined 22; number positive: 0 Pathologic Staging: ypTis, ypN0, ypMx Ancillary studies: per request    Diagnosis 07/05/17 Rectum, biopsy - INVASIVE ADENOCARCINOMA. - SEE COMMENT. Microscopic Comment The adenocarcinoma is poorly differentiated. Dr. Vicente Males has reviewed the case and concurs with this interpretation. Dr. Paulita Fujita was paged on 07/06/17. (JBK:gt, 07/06/17)  PROCEDURES  COLONOSCOPY: Dr. Paulita Fujita 07/05/2017 - Internal hemorrhoids. - Likely malignant partially obstructing tumor in the recto-sigmoid colon. Injected. Biopsied.   RADIOGRAPHIC STUDIES: I have personally reviewed the radiological images as listed and agreed with the findings in the report. No results found.  ASSESSMENT & PLAN:  Ramirez Fullbright is a 52 y.o.  with history of allergies, HTN, GERD, and chronic low back pain. He presents with rectal cancer diagnosed 07/05/2017.  1. Poorly differentiated invasive rectal adenocarcinoma, cT3N1M0, stage IIIb, ypT0N0 -I previously reviewed his biopsy and CT, MRI scan results with the patient and his friend in detail.  -His tumor is located in the rectosigmoid colon. -We discussed he has likely locally advanced stage IIIb disease based  on tumor size, invasion into the muscularis, and positive lymph node, no distant metastasis on day CT and MRI of abdomen and pelvis. -CT chest was negative for metastasis -He saw Dr. Johney Maine on 07/09/17 who recommended neoadjuvant chemoradiation to help shrink tumor followed by surgical low anterior resection with possible adjuvant chemotherapy after surgery. We are in agreement with  this plan. -He underwent concurrent chemoRT with Xeloda on 07/18/17-08/24/17, with mild nausea and rectal bleeding and residual effects on his urine output flow and rectal pain.  -He underwent low anterior resection surgery on 10/10/17, his surgical pathology showed complete response to neoadjuvant chemoradiation therapy. No residual tumor and 22 lymph nodes were negative. -He started adjuvant 2000 mg Xeloda BID 2 weeks on 1 week off on 11/19/17 and completed 5 cycles on 03/04/18. He tolerated well overall, with mild to moderate diarrhea, mild hand-foot syndrome  -We discussed his CT AP from 04/08/18 shows surgical changes with no evidence of disease. He does have mild pancreatic duct dilation, will monitor for now.  -Labs reviewed, CBC showed WBC 2.5K Hg 14.0 Neutro Abs 1.4 and Lymph abs 0.7. CMP, CEA, and iron studies pending.  -He plans to have surveillance Colonoscopy in 09/2018 with Dr. Paulita Fujita.  -continue cancer surveillance.  He is clinically doing well, asymptomatic, exam was unremarkable, including rectal exam, no clinical concern for recurrence. -Repeat CT scan in 3 months, if CT scan continue showing dialted pancreatic duct, I will order MRI/MRCP for furtehr evaluation -F/u in 3 months   2. Anemia; iron deficiency  -He previously had rectal bleeding that recently stopped.  -Will check iron studies periodically and consider IV Feraheme if he does not respond to oral iron therapy.  - I previously recommended him to restart oral iron.  -Labs today showed Hg 14. Anemia resolved now   3. 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    4. frequent bowel movement, Gas -He has been having frequent bowel movement since his surgery, loose  -Pt was seen in the ED on 11/20/17 for bowel dysfunction. He appeared to have rectal tissue prolapse. The tissue was removed and he had no following symptoms.  -I previously suggested he use Gas X or maalox as needed and avoid dairy. Gas X and diet change has not been helping lately.  -He continue to followup with nutritionist.  -I recommend he use half dose immodium the future if needed for his frequent loose bowel movement -I recommend he see PT for pelvic floor exercises to help his bowel control. He is interested. I previously referred but did not go.   5. HTN  -Due to persistent elevated BP in clinic, I previously recommended he see his PCP as his HTN medication may need to be adjusted.  -he is currently taking 5 mg amlodipine -I previously encouraged him to check his BP at home and document the reading  -BP at 146/98 today. He states that she has not taken his medications.  I encouraged him to follow-up with his primary care physician, and restart amlodipine.  He agrees.  PLAN: -Lab and f/u in 3 months with CT scan of abdomen, pelvis and chest with contrast a few days before   No orders of the defined types were placed in this encounter.  All questions were answered. The patient knows to call the clinic with any problems, questions or concerns. I spent 20 minutes counseling the patient face to face. The total time spent in the appointment was 25 minutes.  Dierdre Searles Dweik am acting as scribe for Dr. Truitt Merle.  I have reviewed the above documentation for accuracy and  completeness, and I agree with the  above.    Truitt Merle, MD 07/24/2018 1:27 PM

## 2018-07-24 ENCOUNTER — Telehealth: Payer: Self-pay

## 2018-07-24 ENCOUNTER — Inpatient Hospital Stay: Payer: BLUE CROSS/BLUE SHIELD | Attending: Hematology

## 2018-07-24 ENCOUNTER — Inpatient Hospital Stay (HOSPITAL_BASED_OUTPATIENT_CLINIC_OR_DEPARTMENT_OTHER): Payer: BLUE CROSS/BLUE SHIELD | Admitting: Hematology

## 2018-07-24 ENCOUNTER — Encounter: Payer: Self-pay | Admitting: Hematology

## 2018-07-24 VITALS — BP 146/98 | HR 60 | Temp 98.2°F | Resp 18 | Ht 73.0 in | Wt 199.3 lb

## 2018-07-24 DIAGNOSIS — R197 Diarrhea, unspecified: Secondary | ICD-10-CM | POA: Diagnosis not present

## 2018-07-24 DIAGNOSIS — Z923 Personal history of irradiation: Secondary | ICD-10-CM | POA: Insufficient documentation

## 2018-07-24 DIAGNOSIS — D509 Iron deficiency anemia, unspecified: Secondary | ICD-10-CM | POA: Insufficient documentation

## 2018-07-24 DIAGNOSIS — C20 Malignant neoplasm of rectum: Secondary | ICD-10-CM | POA: Diagnosis not present

## 2018-07-24 DIAGNOSIS — Z79899 Other long term (current) drug therapy: Secondary | ICD-10-CM | POA: Diagnosis not present

## 2018-07-24 DIAGNOSIS — I10 Essential (primary) hypertension: Secondary | ICD-10-CM

## 2018-07-24 DIAGNOSIS — D500 Iron deficiency anemia secondary to blood loss (chronic): Secondary | ICD-10-CM

## 2018-07-24 DIAGNOSIS — Z23 Encounter for immunization: Secondary | ICD-10-CM | POA: Insufficient documentation

## 2018-07-24 LAB — CBC WITH DIFFERENTIAL/PLATELET
BASOS PCT: 1 %
Basophils Absolute: 0 10*3/uL (ref 0.0–0.1)
EOS ABS: 0.1 10*3/uL (ref 0.0–0.5)
Eosinophils Relative: 4 %
HCT: 41.3 % (ref 38.4–49.9)
Hemoglobin: 14 g/dL (ref 13.0–17.1)
Lymphocytes Relative: 28 %
Lymphs Abs: 0.7 10*3/uL — ABNORMAL LOW (ref 0.9–3.3)
MCH: 29.5 pg (ref 27.2–33.4)
MCHC: 33.9 g/dL (ref 32.0–36.0)
MCV: 86.9 fL (ref 79.3–98.0)
MONO ABS: 0.3 10*3/uL (ref 0.1–0.9)
Monocytes Relative: 11 %
Neutro Abs: 1.4 10*3/uL — ABNORMAL LOW (ref 1.5–6.5)
Neutrophils Relative %: 56 %
Platelets: 166 10*3/uL (ref 140–400)
RBC: 4.75 MIL/uL (ref 4.20–5.82)
RDW: 13.9 % (ref 11.0–14.6)
WBC: 2.5 10*3/uL — ABNORMAL LOW (ref 4.0–10.3)

## 2018-07-24 LAB — COMPREHENSIVE METABOLIC PANEL
ALBUMIN: 4.3 g/dL (ref 3.5–5.0)
ALK PHOS: 82 U/L (ref 38–126)
ALT: 26 U/L (ref 0–44)
AST: 27 U/L (ref 15–41)
Anion gap: 6 (ref 5–15)
BUN: 9 mg/dL (ref 6–20)
CALCIUM: 9.2 mg/dL (ref 8.9–10.3)
CO2: 31 mmol/L (ref 22–32)
CREATININE: 0.88 mg/dL (ref 0.61–1.24)
Chloride: 104 mmol/L (ref 98–111)
GFR calc Af Amer: >60 mL/min (ref >60)
GFR calc non Af Amer: >60 mL/min (ref >60)
GLUCOSE: 93 mg/dL (ref 70–99)
Potassium: 4.1 mmol/L (ref 3.5–5.1)
SODIUM: 141 mmol/L (ref 135–145)
Total Bilirubin: 0.7 mg/dL (ref 0.3–1.2)
Total Protein: 7.5 g/dL (ref 6.5–8.1)

## 2018-07-24 LAB — IRON AND TIBC
Iron: 106 ug/dL (ref 42–163)
Saturation Ratios: 28 % — ABNORMAL LOW (ref 42–163)
TIBC: 375 ug/dL (ref 202–409)
UIBC: 269 ug/dL

## 2018-07-24 LAB — FERRITIN: Ferritin: 14 ng/mL — ABNORMAL LOW (ref 24–336)

## 2018-07-24 LAB — CEA (IN HOUSE-CHCC): CEA (CHCC-IN HOUSE): 3.61 ng/mL (ref 0.00–5.00)

## 2018-07-24 MED ORDER — INFLUENZA VAC SPLIT QUAD 0.5 ML IM SUSY
PREFILLED_SYRINGE | INTRAMUSCULAR | Status: AC
  Filled 2018-07-24: qty 0.5

## 2018-07-24 MED ORDER — INFLUENZA VAC SPLIT QUAD 0.5 ML IM SUSY
0.5000 mL | PREFILLED_SYRINGE | Freq: Once | INTRAMUSCULAR | Status: AC
  Administered 2018-07-24: 0.5 mL via INTRAMUSCULAR

## 2018-07-24 NOTE — Telephone Encounter (Signed)
Printed avs and calender of upcoming appointment. Per 10/2 los 

## 2018-07-24 NOTE — Progress Notes (Signed)
FMLA successfully faxed to [employer] [facility] ay [phone]. Mailed copy to patient address on file.

## 2018-07-24 NOTE — Telephone Encounter (Signed)
Signed FMLA papers dropped off in [place] office.

## 2018-08-05 ENCOUNTER — Other Ambulatory Visit: Payer: Self-pay | Admitting: Family Medicine

## 2018-08-07 ENCOUNTER — Telehealth: Payer: Self-pay

## 2018-08-07 NOTE — Telephone Encounter (Signed)
Left voice message for patient, notified lab results show iron level is low.  Per Dr. Burr Medico instructed patient to take an OTC iron supplement such as ferrous sulfate one time a day.

## 2018-08-07 NOTE — Telephone Encounter (Signed)
-----   Message from Hughie Closs, LPN sent at 05/13/8287  4:41 PM EDT -----   ----- Message ----- From: Jonnie Finner, RN Sent: 08/05/2018   9:49 AM EDT To: Hughie Closs, LPN    ----- Message ----- From: Truitt Merle, MD Sent: 08/04/2018   2:14 PM EDT To: Jonnie Finner, RN  Please let pt know that his iron level has been low, and I recommend him to take OTC iron such as ferrous sulfate once daily, watch for constipation, thanks  Truitt Merle  08/04/2018

## 2018-09-23 ENCOUNTER — Encounter: Payer: Self-pay | Admitting: Hematology

## 2018-09-25 ENCOUNTER — Other Ambulatory Visit: Payer: Self-pay | Admitting: Hematology

## 2018-09-25 DIAGNOSIS — C20 Malignant neoplasm of rectum: Secondary | ICD-10-CM

## 2018-10-04 DIAGNOSIS — C189 Malignant neoplasm of colon, unspecified: Secondary | ICD-10-CM | POA: Diagnosis not present

## 2018-10-11 DIAGNOSIS — Z980 Intestinal bypass and anastomosis status: Secondary | ICD-10-CM | POA: Diagnosis not present

## 2018-10-11 DIAGNOSIS — Z85038 Personal history of other malignant neoplasm of large intestine: Secondary | ICD-10-CM | POA: Diagnosis not present

## 2018-10-11 DIAGNOSIS — K640 First degree hemorrhoids: Secondary | ICD-10-CM | POA: Diagnosis not present

## 2018-10-16 ENCOUNTER — Other Ambulatory Visit: Payer: Self-pay | Admitting: Family Medicine

## 2018-10-18 ENCOUNTER — Encounter: Payer: Self-pay | Admitting: Family Medicine

## 2018-10-18 ENCOUNTER — Other Ambulatory Visit: Payer: Self-pay

## 2018-10-18 ENCOUNTER — Ambulatory Visit (INDEPENDENT_AMBULATORY_CARE_PROVIDER_SITE_OTHER): Payer: BLUE CROSS/BLUE SHIELD | Admitting: Family Medicine

## 2018-10-18 VITALS — BP 136/86 | HR 61 | Temp 98.3°F | Ht 69.5 in | Wt 192.4 lb

## 2018-10-18 DIAGNOSIS — Z0000 Encounter for general adult medical examination without abnormal findings: Secondary | ICD-10-CM | POA: Diagnosis not present

## 2018-10-18 LAB — LIPID PANEL
CHOLESTEROL: 109 mg/dL (ref 0–200)
HDL: 61.2 mg/dL (ref >39.00)
LDL Cholesterol: 34 mg/dL (ref 0–99)
NonHDL: 47.72
Total CHOL/HDL Ratio: 2
Triglycerides: 67 mg/dL (ref 0.0–149.0)
VLDL: 13.4 mg/dL (ref 0.0–40.0)

## 2018-10-18 LAB — PSA: PSA: 1.05 ng/mL (ref 0.10–4.00)

## 2018-10-18 LAB — TSH: TSH: 1.75 u[IU]/mL (ref 0.35–4.50)

## 2018-10-18 NOTE — Progress Notes (Signed)
Subjective:     Patient ID: Paul [name], male   DOB: 25-Jul-1966, 52 y.o.   MRN: [id number]  HPI Patient is here for physical exam.  He was diagnosed last year with stage IIIb colon cancer.  He had combination therapy with radiation therapy and chemotherapy prior to surgery followed by further chemotherapy.  He had recent CT scan few months ago that showed some nonspecific pancreas duct dilation otherwise unremarkable.  No signs of recurrent cancer.  He states he just had repeat colonoscopy which showed no signs of recurrent cancer.  Recommended 3-year follow-up  Feels well at this time.  Appetite and weight are stable.  He has had some increased gas production and occasional loose stool.  No bloody stools.  He had recent labs with oncology with mild iron deficiency.  He is taking iron replacement.  Flu vaccine in October.  Tetanus 2016.  No history of shingles vaccine.  Family history significant for father with prostate cancer.  Past Medical History:  Diagnosis Date  . Allergy    grass, dander  . Essential hypertension 07/03/2017  . Family history of prostate cancer   . GERD (gastroesophageal reflux disease)   . History of meningitis   . Hypertension   . 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 COLONOSCOPY WITH PROPOFOL Left 07/05/2017   Procedure: COLONOSCOPY WITH PROPOFOL;  Surgeon: Arta Silence, MD;  Location: [facility];  Service: Endoscopy;  Laterality: Left;  . PROCTOSCOPY N/A 10/10/2017   Procedure: RIGID PROCTOSCOPY;  Surgeon: Michael Boston, MD;  Location: [facility];  Service: General;  Laterality: N/A;  . XI ROBOTIC ASSISTED LOWER ANTERIOR RESECTION N/A 10/10/2017   Procedure: XI ROBOTIC ASSISTED LOWER ANTERIOR RESECTION;  Surgeon: Michael Boston, MD;  Location: [facility];  Service: General;  Laterality: N/A;    reports that he has  never smoked. He has never used smokeless tobacco. He reports that he does not drink alcohol or use drugs. family history includes Asthma in his daughter, father, son, and son; Cancer in his father; Dementia in his maternal grandmother; Hypertension in his mother. Allergies  Allergen Reactions  . Chloroquine Itching     Review of Systems  Constitutional: Negative for activity change, appetite change, fatigue and fever.  HENT: Negative for congestion, ear pain and trouble swallowing.   Eyes: Negative for pain and visual disturbance.  Respiratory: Negative for cough, chest tightness, shortness of breath and wheezing.   Cardiovascular: Negative for chest pain, palpitations and leg swelling.  Gastrointestinal: Negative for abdominal distention, abdominal pain, blood in stool, constipation, diarrhea, nausea, rectal pain and vomiting.  Genitourinary: Negative for dysuria, hematuria and testicular pain.  Musculoskeletal: Negative for arthralgias and joint swelling.  Skin: Negative for rash.  Neurological: Negative for dizziness, syncope, weakness, light-headedness and headaches.  Hematological: Negative for adenopathy.  Psychiatric/Behavioral: Negative for confusion and dysphoric mood.       Objective:   Physical Exam Constitutional:      General: He is not in acute distress.    Appearance: He is well-developed.  HENT:     Head: Normocephalic and atraumatic.     Right Ear: External ear normal.     Left Ear: External ear normal.  Eyes:     Conjunctiva/sclera: Conjunctivae normal.     Pupils: Pupils are equal, round, and reactive to light.  Neck:     Musculoskeletal: Normal range of motion  and neck supple.     Thyroid: No thyromegaly.  Cardiovascular:     Rate and Rhythm: Normal rate and regular rhythm.     Heart sounds: Normal heart sounds. No murmur.  Pulmonary:     Effort: No respiratory distress.     Breath sounds: No wheezing or rales.  Abdominal:     General: Bowel sounds  are normal. There is no distension.     Palpations: Abdomen is soft. There is no mass.     Tenderness: There is no abdominal tenderness. There is no guarding or rebound.  Genitourinary:    Comments: Testes are slightly atrophic.  No testicular mass. Lymphadenopathy:     Cervical: No cervical adenopathy.  Skin:    Findings: No rash.  Neurological:     Mental Status: He is alert and oriented to person, place, and time.     Cranial Nerves: No cranial nerve deficit.     Deep Tendon Reflexes: Reflexes normal.        Assessment:     Patient is here for physical exam.  History of stage IIIb rectal/colon cancer doing well following chemotherapy, radiation therapy, and surgery.  He has hypertension which is well-controlled with amlodipine 5 mg daily    Plan:     -Flu vaccine already given -We discussed Shingrix vaccine and he will check on insurance coverage -Try to establish more consistent exercise -We will check labs today with TSH, PSA, lipid panel.  We are not duplicating CBC and comprehensive chemistries which have been done regularly through oncology.  Eulas Post MD [facility] at [facility]

## 2018-10-18 NOTE — Patient Instructions (Signed)
Consider shingles vaccine (Shingrix) and check on insurance coverage if interested.   

## 2018-10-21 ENCOUNTER — Ambulatory Visit (HOSPITAL_COMMUNITY)
Admission: RE | Admit: 2018-10-21 | Discharge: 2018-10-21 | Disposition: A | Discharge status: Home / Self Care | Payer: BLUE CROSS/BLUE SHIELD | Source: Ambulatory Visit | Attending: Hematology | Admitting: Hematology

## 2018-10-21 DIAGNOSIS — M47814 Spondylosis without myelopathy or radiculopathy, thoracic region: Secondary | ICD-10-CM | POA: Diagnosis not present

## 2018-10-21 DIAGNOSIS — C20 Malignant neoplasm of rectum: Secondary | ICD-10-CM | POA: Diagnosis not present

## 2018-10-21 MED ORDER — IOHEXOL 300 MG/ML  SOLN
100.0000 mL | Freq: Once | INTRAMUSCULAR | Status: AC | PRN
  Administered 2018-10-21: 100 mL via INTRAVENOUS

## 2018-10-21 MED ORDER — SODIUM CHLORIDE (PF) 0.9 % IJ SOLN
INTRAMUSCULAR | Status: AC
  Filled 2018-10-21: qty 50

## 2018-10-22 ENCOUNTER — Inpatient Hospital Stay: Payer: BLUE CROSS/BLUE SHIELD | Attending: Hematology

## 2018-10-22 DIAGNOSIS — D509 Iron deficiency anemia, unspecified: Secondary | ICD-10-CM | POA: Diagnosis not present

## 2018-10-22 DIAGNOSIS — C20 Malignant neoplasm of rectum: Secondary | ICD-10-CM | POA: Insufficient documentation

## 2018-10-22 DIAGNOSIS — D500 Iron deficiency anemia secondary to blood loss (chronic): Secondary | ICD-10-CM

## 2018-10-22 LAB — COMPREHENSIVE METABOLIC PANEL
ALT: 32 U/L (ref 0–44)
AST: 31 U/L (ref 15–41)
Albumin: 4.3 g/dL (ref 3.5–5.0)
Alkaline Phosphatase: 75 U/L (ref 38–126)
Anion gap: 9 (ref 5–15)
BUN: 8 mg/dL (ref 6–20)
CO2: 28 mmol/L (ref 22–32)
Calcium: 9.6 mg/dL (ref 8.9–10.3)
Chloride: 106 mmol/L (ref 98–111)
Creatinine, Ser: 0.83 mg/dL (ref 0.61–1.24)
GFR calc Af Amer: >60 mL/min (ref >60)
Glucose, Bld: 102 mg/dL — ABNORMAL HIGH (ref 70–99)
Potassium: 4.2 mmol/L (ref 3.5–5.1)
Sodium: 143 mmol/L (ref 135–145)
Total Bilirubin: 0.6 mg/dL (ref 0.3–1.2)
Total Protein: 7.4 g/dL (ref 6.5–8.1)

## 2018-10-22 LAB — CBC WITH DIFFERENTIAL/PLATELET
Abs Immature Granulocytes: 0 10*3/uL (ref 0.00–0.07)
Basophils Absolute: 0 10*3/uL (ref 0.0–0.1)
Basophils Relative: 0 %
Eosinophils Absolute: 0.1 10*3/uL (ref 0.0–0.5)
Eosinophils Relative: 5 %
HEMATOCRIT: 42.3 % (ref 39.0–52.0)
Hemoglobin: 14.3 g/dL (ref 13.0–17.0)
Immature Granulocytes: 0 %
LYMPHS ABS: 0.7 10*3/uL (ref 0.7–4.0)
Lymphocytes Relative: 24 %
MCH: 29.9 pg (ref 26.0–34.0)
MCHC: 33.8 g/dL (ref 30.0–36.0)
MCV: 88.3 fL (ref 80.0–100.0)
Monocytes Absolute: 0.2 10*3/uL (ref 0.1–1.0)
Monocytes Relative: 9 %
Neutro Abs: 1.7 10*3/uL (ref 1.7–7.7)
Neutrophils Relative %: 62 %
Platelets: 182 10*3/uL (ref 150–400)
RBC: 4.79 MIL/uL (ref 4.22–5.81)
RDW: 12.3 % (ref 11.5–15.5)
WBC: 2.7 10*3/uL — ABNORMAL LOW (ref 4.0–10.5)
nRBC: 0 % (ref 0.0–0.2)

## 2018-10-22 LAB — IRON AND TIBC
IRON: 105 ug/dL (ref 42–163)
Saturation Ratios: 29 % (ref 20–55)
TIBC: 360 ug/dL (ref 202–409)
UIBC: 255 ug/dL (ref 117–376)

## 2018-10-22 LAB — CEA (IN HOUSE-CHCC): CEA (CHCC-In House): 3.63 ng/mL (ref 0.00–5.00)

## 2018-10-22 LAB — FERRITIN: FERRITIN: 16 ng/mL — AB (ref 24–336)

## 2018-10-25 ENCOUNTER — Encounter: Payer: BLUE CROSS/BLUE SHIELD | Admitting: Family Medicine

## 2018-10-28 ENCOUNTER — Telehealth: Payer: Self-pay | Admitting: Hematology

## 2018-10-28 ENCOUNTER — Encounter: Payer: Self-pay | Admitting: Hematology

## 2018-10-28 ENCOUNTER — Inpatient Hospital Stay: Payer: BLUE CROSS/BLUE SHIELD | Attending: Hematology | Admitting: Hematology

## 2018-10-28 VITALS — BP 143/91 | HR 75 | Temp 98.3°F | Resp 17 | Ht 69.5 in | Wt 193.3 lb

## 2018-10-28 DIAGNOSIS — Z1509 Genetic susceptibility to other malignant neoplasm: Secondary | ICD-10-CM

## 2018-10-28 DIAGNOSIS — C20 Malignant neoplasm of rectum: Secondary | ICD-10-CM | POA: Diagnosis not present

## 2018-10-28 DIAGNOSIS — I10 Essential (primary) hypertension: Secondary | ICD-10-CM | POA: Diagnosis not present

## 2018-10-28 DIAGNOSIS — Z9221 Personal history of antineoplastic chemotherapy: Secondary | ICD-10-CM | POA: Diagnosis not present

## 2018-10-28 DIAGNOSIS — Z923 Personal history of irradiation: Secondary | ICD-10-CM | POA: Diagnosis not present

## 2018-10-28 DIAGNOSIS — Z79899 Other long term (current) drug therapy: Secondary | ICD-10-CM | POA: Diagnosis not present

## 2018-10-28 DIAGNOSIS — D509 Iron deficiency anemia, unspecified: Secondary | ICD-10-CM | POA: Diagnosis not present

## 2018-10-28 NOTE — Progress Notes (Signed)
[facility]   Telephone:([phone]) [phone] [phone]   Clinic Follow up Note   Patient Care Team: Eulas Post, MD as PCP - General (Family Medicine) Arta Silence, MD as Consulting Physician (Gastroenterology) Michael Boston, MD as Consulting Physician (General Surgery) Kyung Rudd, MD as Consulting Physician (Radiation Oncology) Truitt Merle, MD as Consulting Physician (Oncology)  Date of Service:  10/28/2018  CHIEF COMPLAINT: F/u of rectal cancer   SUMMARY OF ONCOLOGIC HISTORY: Oncology History   Cancer Staging Rectal adenocarcinoma [facility]) Staging form: Colon and Rectum, AJCC 8th Edition - Clinical stage from 07/06/2017: Stage IIIB (cT3, cN1, cM0) - Signed by [name], NP on 07/10/2017      Rectal adenocarcinoma s/p robotic LAR resection 10/10/2017   07/03/2017 Imaging    CT ABD PELVIS W CONTRAST IMPRESSION: Possible mass in the rectum compatible carcinoma. Endoscopy recommended for further evaluation. Otherwise negative.    07/03/2017 Tumor Marker    CEA 2.7    07/05/2017 Initial Biopsy    Rectum biopsy -invasive adenocarcinoma, poorly differentiated    07/05/2017 Procedure    Colonoscopy Per Dr. Paulita Fujita Findings: The perianal and digital rectal examinations were normal.  Internal hemorrhoids were found during retroflexion. The hemorrhoids were moderate. No additional abnormalities were found on retroflexion.  A fungating, sessile and ulcerated partially obstructing large mass was found in the recto-sigmoid colon.The mass was partially circumferential (involving two-thirds of the lumen circumference). Oozing was present. Area was successfully injected with 2 mL [place] ink for tattooing. This was biopsied with a cold forceps for histology.    07/05/2017 Initial Diagnosis    Rectal adenocarcinoma ([place])    07/06/2017 Imaging    Staging MRI ABD/PELVIS revealed clinical stage T3N1M0, IIIb     07/18/2017 - 08/24/2017 Radiation Therapy    Radaition  with Dr Lisbeth Renshaw     Radiation treatment dates:   07/18/2017 - 08/24/2017  Site/dose:   The rectum was treated to 45 Gy in 25 fractions of 1.8 Gy, followed by a 5.4 Gy boost in 3 fractions to yield a total dose of 50.4 Gy.  Narrative: The patient tolerated radiation treatment relatively well.   He had rectal bleeding with clots that was bright red after bowel movements. He reported loose stools that were painful. The skin to the radiation site was irritated, but no skin breakage was noted.     07/18/2017 Imaging    CT CHEST  IMPRESSION: Negative. No evidence of thoracic metastatic disease or other significant abnormality.     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  The Hereditary Gene Panel offered by [facility] includes sequencing and/or deletion duplication testing of the following 47 genes: APC, ATM, AXIN2, BARD1, BMPR1A, BRCA1, BRCA2, BRIP1, CDH1, CDK4, CDKN2A (p14ARF), CDKN2A (p16INK4a), CHEK2, CTNNA1, DICER1, EPCAM (Deletion/duplication testing only), GREM1 (promoter region deletion/duplication testing only), KIT, MEN1, MLH1, MSH2, MSH3, MSH6, MUTYH, NBN, NF1, NHTL1, PALB2, PDGFRA, PMS2, POLD1, POLE, PTEN, RAD50, RAD51C, RAD51D, SDHB, SDHC, SDHD, SMAD4, SMARCA4. STK11, TP53, TSC1, TSC2, and VHL.  The following genes were evaluated for sequence changes only: SDHA and HOXB13 c.251G>A variant only.  The report date is August 27, 2017.     10/10/2017 Surgery    XI ROBOTIC ASSISTED LOWER ANTERIOR RESECTION AND RIGID PROCTOSCOPY bu Dr. GrJohney Mainend Dr. WhDema Severin12/19/18  10/10/2017 Pathology Results        Diagnosis 10/10/17  1. Colon, segmental resection for tumor, sigmoid SMALL FOCUS OF COLONIC GLANDS WITH HIGH GRADE DYSPLASIA POST NEOADJUVANT CHEMORADIATION THERAPY BIOPSY SITE WITH CHRONIC  INFLAMMATION NO RESIDULE INVASIVE CARCINOMA PRESENT TWENTY-TWO BENIGN LYMPH NODES (0/22) 2. Colon, resection margin (donut), final distal BENIGN COLONIC TISSUE    11/19/2017 - 03/04/2018 Adjuvant Chemotherapy    He started adjuvant 2000 mg Xeloda BID 2 weeks on 1 week off on 11/19/17 and completed 5 cycles on 03/04/18    04/08/2018 Imaging    CT AP W Contrast 04/08/18 IMPRESSION: 1. Postoperative and post treatment related changes in the low anatomic pelvis, as above. Today's study will serve as a new baseline for future follow-up examinations. At this time, there is no definitive evidence to strongly suggest local recurrence of disease or definite metastatic disease in the abdomen or pelvis. 2. Mild pancreatic ductal dilatation. This is of uncertain etiology and significance. No obstructing lesion identified in the pancreatic head. Correlation with nonemergent MRI of the abdomen with and without IV gadolinium with MRCP is suggested in the near future to evaluate for the etiology of this dilatation.     10/21/2018 Imaging    CT CAP W Contrast 10/21/18 IMPRESSION: Status post low anterior resection with suspected radiation changes in the presacral space. No evidence of recurrent or metastatic disease.      CURRENT THERAPY:  Surveillance   INTERVAL HISTORY:  Paul [name] is here for a follow up of rectal cancer. He presents to the clinic today by himself. He notes still having irregular bowel movement with intermittent diarrhea or constipation. He also notes more gas. He notes this is mild to moderate and manageable.  He has lost 6 pounds since last visit but not trying to maintain weight overall.    REVIEW OF SYSTEMS:   Constitutional: Denies fevers, chills (+) mild weight loss Eyes: Denies blurriness of vision Ears, nose, mouth, throat, and face: Denies mucositis or sore throat Respiratory: Denies cough, dyspnea or wheezes Cardiovascular: Denies palpitation, chest  discomfort or lower extremity swelling Gastrointestinal:  Denies nausea, heartburn or change in bowel habits Skin: Denies abnormal skin rashes Lymphatics: Denies new lymphadenopathy or easy bruising Neurological:Denies numbness, tingling or new weaknesses Behavioral/Psych: Mood is stable, no new changes  All other systems were reviewed with the patient and are negative.  MEDICAL HISTORY:  Past Medical History:  Diagnosis Date  . Allergy    grass, dander  . Essential hypertension 07/03/2017  . Family history of prostate cancer   . GERD (gastroesophageal reflux disease)   . History of meningitis   . Hypertension   . 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 COLONOSCOPY WITH PROPOFOL Left 07/05/2017   Procedure: COLONOSCOPY WITH PROPOFOL;  Surgeon: Arta Silence, MD;  Location: [facility];  Service: Endoscopy;  Laterality: Left;  . PROCTOSCOPY N/A 10/10/2017   Procedure: RIGID PROCTOSCOPY;  Surgeon: Michael Boston, MD;  [facility];  Service: General;  Laterality: N/A;  . XI ROBOTIC ASSISTED LOWER ANTERIOR RESECTION N/A 10/10/2017   Procedure: XI ROBOTIC ASSISTED LOWER ANTERIOR RESECTION;  Surgeon: Michael Boston, MD;  Location[facility];  Service: General;  Laterality: N/A;    I have reviewed the social history and family history with the patient and they are unchanged from previous note.  ALLERGIES:  is allergic  to chloroquine.  MEDICATIONS:  Current Outpatient Medications  Medication Sig Dispense Refill  . amLODipine (NORVASC) 5 MG tablet TAKE 1 TABLET DAILY 90 tablet 0  . calcium carbonate (OS-CAL - DOSED IN MG OF ELEMENTAL CALCIUM) 1250 (500 Ca) MG tablet Take 1 tablet by mouth daily as needed (for bones).    . Calcium Citrate-Vitamin D (CALCIUM CITRATE+D3 PETITES PO) Take 1 tablet by mouth daily.    .  cetirizine (ZYRTEC) 10 MG tablet Take 10 mg by mouth daily as needed for allergies.    Marland Kitchen GARLIC PO Take 1 capsule by mouth daily.     . Multiple Vitamin (MULTIVITAMIN WITH MINERALS) TABS tablet Take 1 tablet by mouth daily.    . naproxen (NAPROSYN) 500 MG tablet Take 1 tablet (500 mg total) by mouth every 12 (twelve) hours as needed for mild pain or moderate pain. 30 tablet 1  . omeprazole (PRILOSEC) 40 MG capsule Take 40 mg by mouth daily as needed (heart burn).     . ondansetron (ZOFRAN) 8 MG tablet Take 1 tablet (8 mg total) by mouth every 8 (eight) hours as needed for nausea or vomiting. 30 tablet 3  . prochlorperazine (COMPAZINE) 10 MG tablet Take 1 tablet (10 mg total) by mouth every 6 (six) hours as needed for nausea or vomiting. 30 tablet 2  . traMADol (ULTRAM) 50 MG tablet Take 1-2 tablets (50-100 mg total) by mouth every 6 (six) hours as needed for moderate pain or severe pain. 30 tablet 0   No current facility-administered medications for this visit.     PHYSICAL EXAMINATION: ECOG PERFORMANCE STATUS: 0 - Asymptomatic  Vitals:   10/28/18 1030  BP: (!) 143/91  Pulse: 75  Resp: 17  Temp: 98.3 F (36.8 C)  SpO2: 98%   Filed Weights   10/28/18 1030  Weight: 193 lb 4.8 oz (87.7 kg)    GENERAL:alert, no distress and comfortable SKIN: skin color, texture, turgor are normal, no rashes or significant lesions EYES: normal, Conjunctiva are pink and non-injected, sclera clear OROPHARYNX:no exudate, no erythema and lips, buccal mucosa, and tongue normal  NECK: supple, thyroid normal size, non-tender, without nodularity LYMPH:  no palpable lymphadenopathy in the cervical, axillary or inguinal LUNGS: clear to auscultation and percussion with normal breathing effort HEART: regular rate & rhythm and no murmurs and no lower extremity edema ABDOMEN:abdomen soft, non-tender and normal bowel sounds Musculoskeletal:no cyanosis of digits and no clubbing  NEURO: alert & oriented x 3 with  fluent speech, no focal motor/sensory deficits  LABORATORY DATA:  I have reviewed the data as listed CBC Latest Ref Rng & Units 10/22/2018 07/24/2018 04/08/2018  WBC 4.0 - 10.5 K/uL 2.7(L) 2.5(L) 2.4(L)  Hemoglobin 13.0 - 17.0 g/dL 14.3 14.0 12.7(L)  Hematocrit 39.0 - 52.0 % 42.3 41.3 37.9(L)  Platelets 150 - 400 K/uL 182 166 175     CMP Latest Ref Rng & Units 10/22/2018 07/24/2018 04/08/2018  Glucose 70 - 99 mg/dL 102(H) 93 100  BUN 6 - 20 mg/dL '8 9 9  ' Creatinine 0.61 - 1.24 mg/dL 0.83 0.88 0.80  Sodium 135 - 145 mmol/L 143 141 136  Potassium 3.5 - 5.1 mmol/L 4.2 4.1 3.8  Chloride 98 - 111 mmol/L 106 104 103  CO2 22 - 32 mmol/L '28 31 25  ' Calcium 8.9 - 10.3 mg/dL 9.6 9.2 8.9  Total Protein 6.5 - 8.1 g/dL 7.4 7.5 6.9  Total Bilirubin 0.3 - 1.2 mg/dL 0.6 0.7 0.5  Alkaline Phos 38 - 126  U/L 75 82 82  AST 15 - 41 U/L 31 27 34  ALT 0 - 44 U/L 32 26 22      RADIOGRAPHIC STUDIES: I have personally reviewed the radiological images as listed and agreed with the findings in the report. No results found.   ASSESSMENT & PLAN:  Paul [name] is a 53 y.o. male with   1. Poorly differentiated invasive rectal adenocarcinoma, cT3N1M0, stage IIIb, ypT0N0 -He was diagnosed in 06/2017. He is s/p concurrent chemoradiation with Xeloda, Surgical resection and 5 cycles of adjuvant Xeloda.  -He has recovered well from surgery and chemo treatment  -We discussed his surveillance CT from 12/30.85 which shows  No evidence of recurrent or metastatic disease. I personally reviewed the images  -He completed repeat colonoscopy in 10/11/2018 through [facility]. I will obtain a copy of report. Will repeat in 3 years.  -He is clinically doing well. Last week's CBC, CMP, CEA and iron panel normal except, Ferritin at 16 and Glucose at 102.  -I discussed the longer he remain NED the less likely his cancer will recur. He is still at higher risk for first 2-3 years. He will continue cancer surveillance. -F/u in 4  months.    2. Anemia; iron deficiency  -He previously had rectal bleeding that recently stopped.  -On oral iron, will continue, but he is not very compliant. -Anemia resolved. Last week's Ferritin at 16, still slightly low,   3. 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    4. frequent bowel movement, Gas -He still has intermittent diarrhea/constipation along with gas, but so far manageable.  Likely secondary to his surgery.  5. HTN  -he is currently taking 5 mg amlodipine daily  -BP at 143/91 today (10/28/2018), controlled   PLAN: -We reviewed his labs and CT scan, He is NED -Lab and f/u in 4 months     No problem-specific Assessment & Plan notes found for this encounter.   No orders of the defined types were placed in this encounter.  All questions were answered. The patient knows to call the clinic with any problems, questions or concerns. No barriers to learning was detected. I spent 20 minutes counseling the patient face to face. The total time spent in the appointment was 25 minutes and more than 50% was on counseling and review of test results     Truitt Merle, MD 10/28/2018   I, Joslyn Devon, am acting as scribe for Truitt Merle, MD.   I have reviewed the above documentation for accuracy and completeness, and I agree with the above.

## 2018-10-28 NOTE — Telephone Encounter (Signed)
Printed avs. °

## 2018-10-30 ENCOUNTER — Telehealth: Payer: Self-pay

## 2018-10-30 NOTE — Telephone Encounter (Signed)
Faxed request for last colonoscopy report to be sent to [place] from 12/19.

## 2018-12-04 ENCOUNTER — Encounter: Payer: Self-pay | Admitting: Family Medicine

## 2018-12-04 ENCOUNTER — Other Ambulatory Visit: Payer: Self-pay

## 2018-12-04 ENCOUNTER — Ambulatory Visit (INDEPENDENT_AMBULATORY_CARE_PROVIDER_SITE_OTHER): Payer: BLUE CROSS/BLUE SHIELD | Admitting: Family Medicine

## 2018-12-04 VITALS — BP 122/76 | HR 68 | Temp 97.8°F | Ht 69.5 in | Wt 192.6 lb

## 2018-12-04 DIAGNOSIS — R059 Cough, unspecified: Secondary | ICD-10-CM

## 2018-12-04 DIAGNOSIS — R05 Cough: Secondary | ICD-10-CM | POA: Diagnosis not present

## 2018-12-04 MED ORDER — HYDROCODONE-HOMATROPINE 5-1.5 MG/5ML PO SYRP: 5.0000 mL | ORAL_SOLUTION | Freq: Four times a day (QID) | ORAL | 0 refills | Status: AC | PRN

## 2018-12-04 NOTE — Progress Notes (Signed)
Subjective:     Patient ID: Paul [name], male   DOB: 1966/05/03, 53 y.o.   MRN: [id number]  HPI Patient is seen with chief complaint of cough.  He started off couple weeks ago with very mild transient sore throat and some hoarseness.  No fever.  Cough is occasionally productive.  No dyspnea.  No wheezing.  Never smoked.  Cough is worse at night and is frequently waking him.  He has tried NyQuil and Delsym without much improvement.  Is recently switched to Mucinex.  He has history of rectal adenocarcinoma status [name] robotic LAR resection 10/10/2017.  He had recent CT chest as well as abdomen pelvis which showed no signs of recurrence.  Past Medical History:  Diagnosis Date  . Allergy    grass, dander  . Essential hypertension 07/03/2017  . Family history of prostate cancer   . GERD (gastroesophageal reflux disease)   . History of meningitis   . Hypertension   . 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 COLONOSCOPY WITH PROPOFOL Left 07/05/2017   Procedure: COLONOSCOPY WITH PROPOFOL;  Surgeon: Paul Silence, MD;  Location: [place];  Service: Endoscopy;  Laterality: Left;  . PROCTOSCOPY N/A 10/10/2017   Procedure: RIGID PROCTOSCOPY;  Surgeon: Paul Boston, MD;  [facility];  Service: General;  Laterality: N/A;  . XI ROBOTIC ASSISTED LOWER ANTERIOR RESECTION N/A 10/10/2017   Procedure: XI ROBOTIC ASSISTED LOWER ANTERIOR RESECTION;  Surgeon: Paul Boston, MD;  Location[facility];  Service: General;  Laterality: N/A;    reports that he has never smoked. He has never used smokeless tobacco. He reports that he does not drink alcohol or use drugs. family history includes Asthma in his daughter, father, son, and son; Cancer in his father; Dementia in his maternal grandmother; Hypertension in his mother. Allergies  Allergen Reactions  .  Chloroquine Itching       Review of Systems  Constitutional: Negative for chills and fever.  HENT: Negative for sinus pressure and sinus pain.   Respiratory: Positive for cough. Negative for shortness of breath and wheezing.   Cardiovascular: Negative for chest pain.       Objective:   Physical Exam Constitutional:      Appearance: Normal appearance.  HENT:     Right Ear: Tympanic membrane normal.     Left Ear: Tympanic membrane normal.     Mouth/Throat:     Pharynx: Oropharynx is clear. No oropharyngeal exudate or posterior oropharyngeal erythema.  Neck:     Musculoskeletal: Neck supple.  Cardiovascular:     Rate and Rhythm: Normal rate and regular rhythm.  Pulmonary:     Effort: Pulmonary effort is normal. No respiratory distress.     Breath sounds: Normal breath sounds. No stridor. No wheezing, rhonchi or rales.  Neurological:     Mental Status: He is alert.        Assessment:     Cough.  Suspect related to recent viral infection.  Nonfocal exam.  Afebrile.  No respiratory distress.    Plan:     -Hycodan cough syrup 1 teaspoon nightly for severe cough -Follow-up promptly for any fever, increased shortness of breath, or other concerns.  Touch base if cough not resolving over the next few weeks.  He is aware that sometimes [name] viral cough can last for several weeks  Paul Post MD [facility] at  [facility]

## 2018-12-04 NOTE — Patient Instructions (Signed)
Follow up for any fever or increased shortness of breath  Let me know if cough not resolving over next few weeks  Cough from viral infections can sometimes take several weeks to resolve  Caution with cough syrup which can cause some sedation.

## 2018-12-25 NOTE — Progress Notes (Signed)
Extended intermittent FMLA successfully faxed to [place] at [phone]. Mailed copy to patient address on file.

## 2019-01-15 ENCOUNTER — Other Ambulatory Visit: Payer: Self-pay | Admitting: Family Medicine

## 2019-02-25 ENCOUNTER — Telehealth: Payer: Self-pay | Admitting: Hematology

## 2019-02-25 NOTE — Telephone Encounter (Signed)
Spoke with patient and he agreed to [employer] on 02/28/19.  Emailed step-by-step instructions how to download and access Webex application.  He will call with any questions.

## 2019-02-27 NOTE — Progress Notes (Signed)
Paul [name]   [phone] [phone]) [phone]   Clinic Follow up Note   Patient Care Team: Eulas Post, MD as PCP - General (Family Medicine) Arta Silence, MD as Consulting Physician (Gastroenterology) Michael Boston, MD as Consulting Physician (General Surgery) Kyung Rudd, MD as Consulting Physician (Radiation Oncology) Truitt Merle, MD as Consulting Physician (Oncology)   I connected with Paul [name] on 02/28/2019 at 10:30 AM EDT by video enabled telemedicine visit and verified that I am speaking with the correct person using two identifiers.  I discussed the limitations, risks, security and privacy concerns of performing an evaluation and management service by telephone and the availability of in person appointments. I also discussed with the patient that there may be a patient responsible charge related to this service. The patient expressed understanding and agreed to proceed.   Patient's location:  At work  Provider's location:  My Office   CHIEF COMPLAINT: F/u of rectal cancer   SUMMARY OF ONCOLOGIC HISTORY: Oncology History   Cancer Staging Rectal adenocarcinoma [facility]) Staging form: Colon and Rectum, AJCC 8th Edition - Clinical stage from 07/06/2017: Stage IIIB (cT3, cN1, cM0) - Signed by [name], NP on 07/10/2017      Rectal adenocarcinoma s/p robotic LAR resection 10/10/2017   07/03/2017 Imaging    CT ABD PELVIS W CONTRAST IMPRESSION: Possible mass in the rectum compatible carcinoma. Endoscopy recommended for further evaluation. Otherwise negative.    07/03/2017 Tumor Marker    CEA 2.7    07/05/2017 Initial Biopsy    Rectum biopsy -invasive adenocarcinoma, poorly differentiated    07/05/2017 Procedure    Colonoscopy Per Dr. Paulita Fujita Findings: The perianal and digital rectal examinations were normal.  Internal hemorrhoids were found during retroflexion. The hemorrhoids were moderate. No additional abnormalities were found on  retroflexion.  A fungating, sessile and ulcerated partially obstructing large mass was found in the recto-sigmoid colon.The mass was partially circumferential (involving two-thirds of the lumen circumference). Oozing was present. Area was successfully injected with 2 mL [place] ink for tattooing. This was biopsied with a cold forceps for histology.    07/05/2017 Initial Diagnosis    Rectal adenocarcinoma ([place])    07/06/2017 Imaging    Staging MRI ABD/PELVIS revealed clinical stage T3N1M0, IIIb     07/18/2017 - 08/24/2017 Radiation Therapy    Radaition with Dr Lisbeth Renshaw     Radiation treatment dates:   07/18/2017 - 08/24/2017  Site/dose:   The rectum was treated to 45 Gy in 25 fractions of 1.8 Gy, followed by a 5.4 Gy boost in 3 fractions to yield a total dose of 50.4 Gy.  Narrative: The patient tolerated radiation treatment relatively well.   He had rectal bleeding with clots that was bright red after bowel movements. He reported loose stools that were painful. The skin to the radiation site was irritated, but no skin breakage was noted.     07/18/2017 Imaging    CT CHEST  IMPRESSION: Negative. No evidence of thoracic metastatic disease or other significant abnormality.     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  The Hereditary Gene Panel offered by [facility] includes sequencing and/or deletion duplication testing of the following 47 genes: APC, ATM, AXIN2, BARD1, BMPR1A, BRCA1, BRCA2, BRIP1, CDH1, CDK4, CDKN2A (p14ARF), CDKN2A (p16INK4a), CHEK2, CTNNA1, DICER1,  EPCAM (Deletion/duplication testing only), GREM1 (promoter region deletion/duplication testing only), KIT, MEN1, MLH1, MSH2, MSH3, MSH6, MUTYH, NBN, NF1, NHTL1, PALB2, PDGFRA, PMS2, POLD1, POLE, PTEN, RAD50, RAD51C, RAD51D, SDHB, SDHC, SDHD, SMAD4,  SMARCA4. STK11, TP53, TSC1, TSC2, and VHL.  The following genes were evaluated for sequence changes only: SDHA and HOXB13 c.251G>A variant only.  The report date is August 27, 2017.     10/10/2017 Surgery    XI ROBOTIC ASSISTED LOWER ANTERIOR RESECTION AND RIGID PROCTOSCOPY bu Dr. Johney Maine and Dr. Dema Severin  10/10/17     10/10/2017 Pathology Results        Diagnosis 10/10/17  1. Colon, segmental resection for tumor, sigmoid SMALL FOCUS OF COLONIC GLANDS WITH HIGH GRADE DYSPLASIA POST NEOADJUVANT CHEMORADIATION THERAPY BIOPSY SITE WITH CHRONIC INFLAMMATION NO RESIDULE INVASIVE CARCINOMA PRESENT TWENTY-TWO BENIGN LYMPH NODES (0/22) 2. Colon, resection margin (donut), final distal BENIGN COLONIC TISSUE    11/19/2017 - 03/04/2018 Adjuvant Chemotherapy    He started adjuvant 2000 mg Xeloda BID 2 weeks on 1 week off on 11/19/17 and completed 5 cycles on 03/04/18    04/08/2018 Imaging    CT AP W Contrast 04/08/18 IMPRESSION: 1. Postoperative and post treatment related changes in the low anatomic pelvis, as above. Today's study will serve as a new baseline for future follow-up examinations. At this time, there is no definitive evidence to strongly suggest local recurrence of disease or definite metastatic disease in the abdomen or pelvis. 2. Mild pancreatic ductal dilatation. This is of uncertain etiology and significance. No obstructing lesion identified in the pancreatic head. Correlation with nonemergent MRI of the abdomen with and without IV gadolinium with MRCP is suggested in the near future to evaluate for the etiology of this dilatation.     10/21/2018 Imaging    CT CAP W Contrast 10/21/18 IMPRESSION: Status post low anterior resection with suspected radiation changes in the presacral space. No evidence of recurrent or metastatic disease.      CURRENT THERAPY:  Surveillance   INTERVAL HISTORY:  Paul [name] is here for a follow up of rectal cancer. He was able to  identify himself by face-to-face video. He notes he has been he will have diarrhea or constipation based on how much laxatives he uses, Miralax.  He tries to eat adequate fruits and vegetables. He notes to not having abdominal pain but will have discomfort from gas.     REVIEW OF SYSTEMS:   Constitutional: Denies fevers, chills or abnormal weight loss Eyes: Denies blurriness of vision Ears, nose, mouth, throat, and face: Denies mucositis or sore throat Respiratory: Denies cough, dyspnea or wheezes Cardiovascular: Denies palpitation, chest discomfort or lower extremity swelling Gastrointestinal:  Denies nausea, heartburn (+) diarrhea or constipation (+) abdominal gas Skin: Denies abnormal skin rashes Lymphatics: Denies new lymphadenopathy or easy bruising Neurological:Denies numbness, tingling or new weaknesses Behavioral/Psych: Mood is stable, no new changes  All other systems were reviewed with the patient and are negative.  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    SURGICAL HISTORY: Past Surgical History:  Procedure Laterality Date   COLONOSCOPY WITH PROPOFOL Left 07/05/2017   Procedure: COLONOSCOPY WITH PROPOFOL;  Surgeon: Arta Silence,  MD;  Location: [facility];  Service: Endoscopy;  Laterality: Left;   PROCTOSCOPY N/A 10/10/2017   Procedure: RIGID PROCTOSCOPY;  Surgeon: Michael Boston, MD;  Location: [facility];  Service: General;  Laterality: N/A;   XI ROBOTIC ASSISTED LOWER ANTERIOR RESECTION N/A 10/10/2017   Procedure: XI ROBOTIC ASSISTED LOWER ANTERIOR RESECTION;  Surgeon: Michael Boston, MD;  Location: [facility];  Service: General;   Laterality: N/A;    I have reviewed the social history and family history with the patient and they are unchanged from previous note.  ALLERGIES:  is allergic to chloroquine.  MEDICATIONS:  Current Outpatient Medications  Medication Sig Dispense Refill   amLODipine (NORVASC) 5 MG tablet TAKE 1 TABLET DAILY 90 tablet 3   calcium carbonate (OS-CAL - DOSED IN MG OF ELEMENTAL CALCIUM) 1250 (500 Ca) MG tablet Take 1 tablet by mouth daily as needed (for bones).     Calcium Citrate-Vitamin D (CALCIUM CITRATE+D3 PETITES PO) Take 1 tablet by mouth daily.     cetirizine (ZYRTEC) 10 MG tablet Take 10 mg by mouth daily as needed for allergies.     GARLIC PO Take 1 capsule by mouth daily.      Multiple Vitamin (MULTIVITAMIN WITH MINERALS) TABS tablet Take 1 tablet by mouth daily.     naproxen (NAPROSYN) 500 MG tablet Take 1 tablet (500 mg total) by mouth every 12 (twelve) hours as needed for mild pain or moderate pain. 30 tablet 1   omeprazole (PRILOSEC) 40 MG capsule Take 40 mg by mouth daily as needed (heart burn).      ondansetron (ZOFRAN) 8 MG tablet Take 1 tablet (8 mg total) by mouth every 8 (eight) hours as needed for nausea or vomiting. 30 tablet 3   prochlorperazine (COMPAZINE) 10 MG tablet Take 1 tablet (10 mg total) by mouth every 6 (six) hours as needed for nausea or vomiting. 30 tablet 2   traMADol (ULTRAM) 50 MG tablet Take 1-2 tablets (50-100 mg total) by mouth every 6 (six) hours as needed for moderate pain or severe pain. 30 tablet 0   No current facility-administered medications for this visit.     PHYSICAL EXAMINATION: ECOG PERFORMANCE STATUS: 0 - Asymptomatic  No vitals taken today, Exam not performed today  LABORATORY DATA:  I have reviewed the data as listed CBC Latest Ref Rng & Units 10/22/2018 07/24/2018 04/08/2018  WBC 4.0 - 10.5 K/uL 2.7(L) 2.5(L) 2.4(L)  Hemoglobin 13.0 - 17.0 g/dL 14.3 14.0 12.7(L)  Hematocrit 39.0 - 52.0 % 42.3 41.3 37.9(L)  Platelets 150  - 400 K/uL 182 166 175     CMP Latest Ref Rng & Units 10/22/2018 07/24/2018 04/08/2018  Glucose 70 - 99 mg/dL 102(H) 93 100  BUN 6 - 20 mg/dL '8 9 9  ' Creatinine 0.61 - 1.24 mg/dL 0.83 0.88 0.80  Sodium 135 - 145 mmol/L 143 141 136  Potassium 3.5 - 5.1 mmol/L 4.2 4.1 3.8  Chloride 98 - 111 mmol/L 106 104 103  CO2 22 - 32 mmol/L '28 31 25  ' Calcium 8.9 - 10.3 mg/dL 9.6 9.2 8.9  Total Protein 6.5 - 8.1 g/dL 7.4 7.5 6.9  Total Bilirubin 0.3 - 1.2 mg/dL 0.6 0.7 0.5  Alkaline Phos 38 - 126 U/L 75 82 82  AST 15 - 41 U/L 31 27 34  ALT 0 - 44 U/L 32 26 22      RADIOGRAPHIC STUDIES: I have personally reviewed the radiological images as listed and agreed with the findings in the report.  No results found.   ASSESSMENT & PLAN:  Paul [name] is a 53 y.o. male with   1. Poorly differentiated invasive rectal adenocarcinoma, cT3N1M0, stage IIIb, ypT0N0 -He was diagnosed in 06/2017. He is s/p concurrent chemoradiation with Xeloda, Surgical resection and 5 cycles of adjuvant Xeloda.  -He has recovered well from surgery and chemo treatment  -He completed repeat colonoscopy in 10/11/2018 through [facility]. Will repeat in 3 years.  -He is clinically doing well. He does have gas and mild intermittent constipation/diarrhea as side effects from treatment. This is currently manageable with OTC medications. There is no clinical concern for recurrence.  -I discussed the longer he remain NED the less likely his cancer will recur. He is still at higher risk for first 2-3 years. He will continue cancer surveillance. Plan to have next CT scan at end of 2020 -F/u in 4 months with lab   2. Anemia; iron deficiency  -He previously had rectal bleeding that recently stopped.  -On oral iron, will continue, but he is not very compliant. -Anemia resolved  3. 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   4. Frequent  bowel movement, Gas  -He still has intermittent diarrhea/constipation along with gas, but so far manageable.  Likely secondary to his surgery. -BMs are based on how much Miralax he takes. I encouraged him to reduce Miralax dose.  -I also recommend OTC Gas-X for has gas and discomfort. He is agreeable.   5. HTN  -he is currently taking 5 mg amlodipine daily  -Controlled   PLAN: -He is clinically doing well  -Lab and f/u in 4 months    No problem-specific Assessment & Plan notes found for this encounter.   No orders of the defined types were placed in this encounter.  I discussed the assessment and treatment plan with the patient. The patient was provided an opportunity to ask questions and all were answered. The patient agreed with the plan and demonstrated an understanding of the instructions.  The patient was advised to call back or seek an in-person evaluation if the symptoms worsen or if the condition fails to improve as anticipated.  I provided 15 minutes of face-to-face video visit time during this encounter, and > 50% was spent counseling as documented under my assessment & plan.    Truitt Merle, MD 02/28/2019   I, Joslyn Devon, am acting as scribe for Truitt Merle, MD.   I have reviewed the above documentation for accuracy and completeness, and I agree with the above.

## 2019-02-28 ENCOUNTER — Telehealth: Payer: Self-pay | Admitting: Hematology

## 2019-02-28 ENCOUNTER — Other Ambulatory Visit: Payer: BLUE CROSS/BLUE SHIELD

## 2019-02-28 ENCOUNTER — Inpatient Hospital Stay: Payer: BLUE CROSS/BLUE SHIELD | Attending: Hematology | Admitting: Hematology

## 2019-02-28 DIAGNOSIS — D509 Iron deficiency anemia, unspecified: Secondary | ICD-10-CM | POA: Diagnosis not present

## 2019-02-28 DIAGNOSIS — Z79899 Other long term (current) drug therapy: Secondary | ICD-10-CM

## 2019-02-28 DIAGNOSIS — C20 Malignant neoplasm of rectum: Secondary | ICD-10-CM | POA: Diagnosis not present

## 2019-02-28 DIAGNOSIS — Z9221 Personal history of antineoplastic chemotherapy: Secondary | ICD-10-CM | POA: Diagnosis not present

## 2019-02-28 DIAGNOSIS — I10 Essential (primary) hypertension: Secondary | ICD-10-CM

## 2019-02-28 DIAGNOSIS — Z923 Personal history of irradiation: Secondary | ICD-10-CM

## 2019-02-28 DIAGNOSIS — Z8042 Family history of malignant neoplasm of prostate: Secondary | ICD-10-CM

## 2019-02-28 NOTE — Telephone Encounter (Signed)
Scheduled appt per 5/8 los. ° °A calendar will be mailed out. °

## 2019-03-01 ENCOUNTER — Encounter: Payer: Self-pay | Admitting: Hematology

## 2019-04-11 IMAGING — CT CT ABD-PELV W/ CM
2 of 5 series · 16 of 46 positions shown, 18 images · IV contrast (iopamidol)
Comparison: None.

CLINICAL DATA: Abdominal pain.  Bloody diarrhea.

EXAM:
CT ABDOMEN AND PELVIS WITH CONTRAST
TECHNIQUE: Multidetector CT imaging of the abdomen and pelvis was performed
using the standard protocol following bolus administration of
intravenous contrast.
CONTRAST:  100mL 7ANR5E-M00 IOPAMIDOL ([id number]-M00) INJECTION 61%

[Series 3: a/p w/ 5mm · axial · 0.83mm/px · z∈[+896,+1321]mm · 13 of 95 slices shown, 15 images]
[im 5/95  soft-tissue]
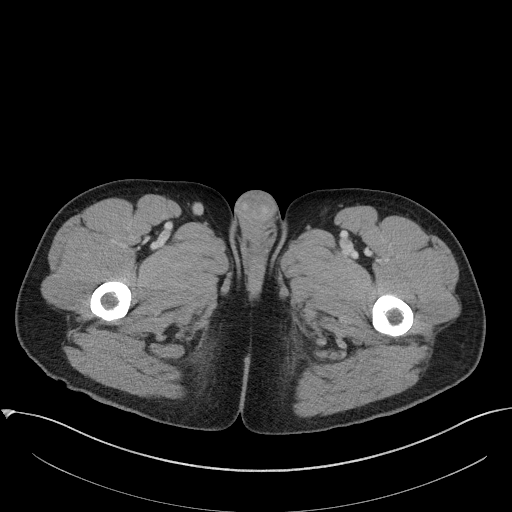
[im 5/95  bone]
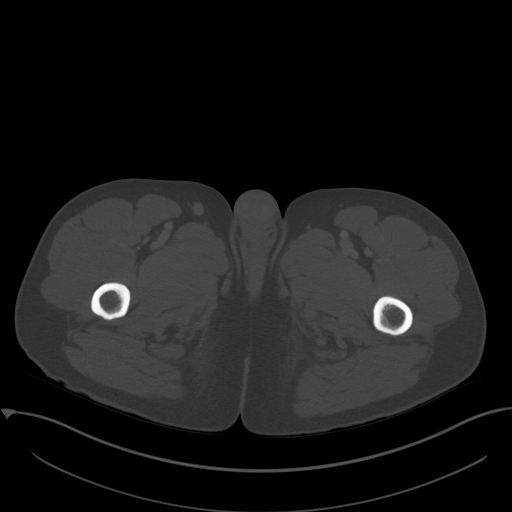
[im 15/95  soft-tissue]
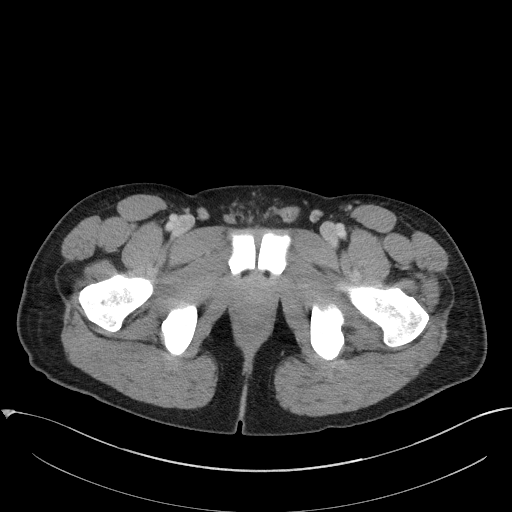
[im 20/95  soft-tissue]
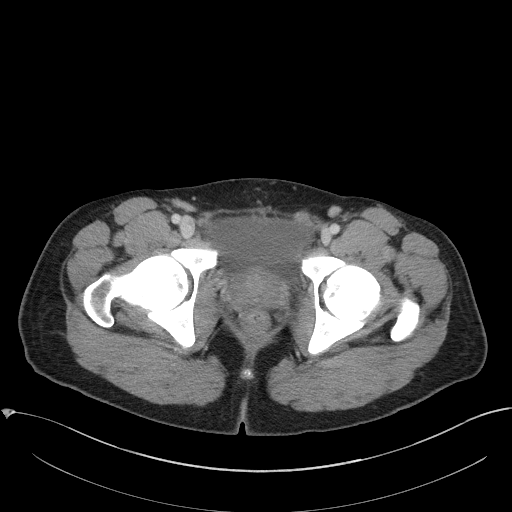
[im 25/95  soft-tissue]
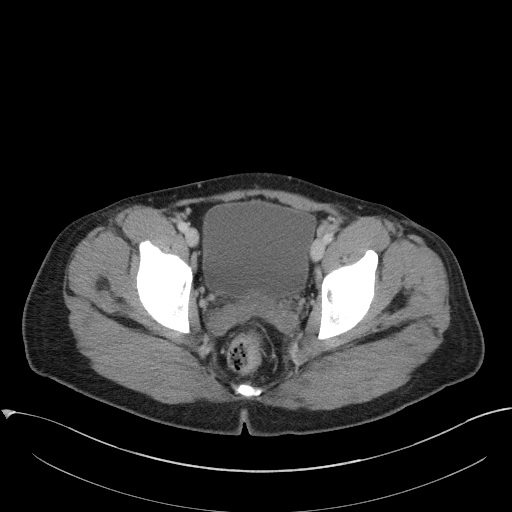
[im 35/95  soft-tissue]
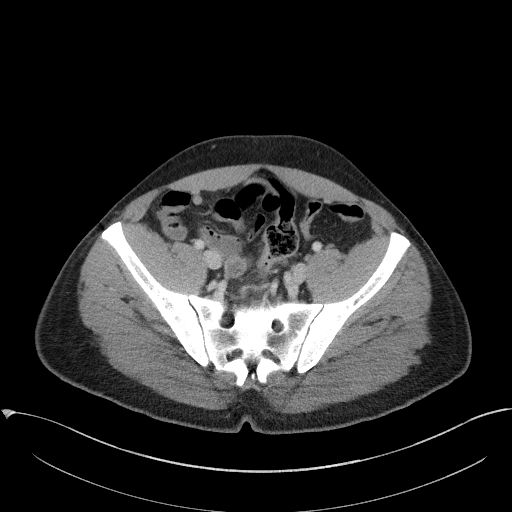
[im 40/95  soft-tissue]
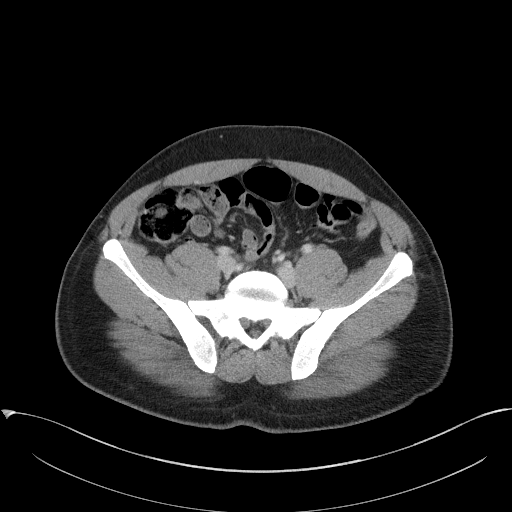
[im 50/95  soft-tissue]
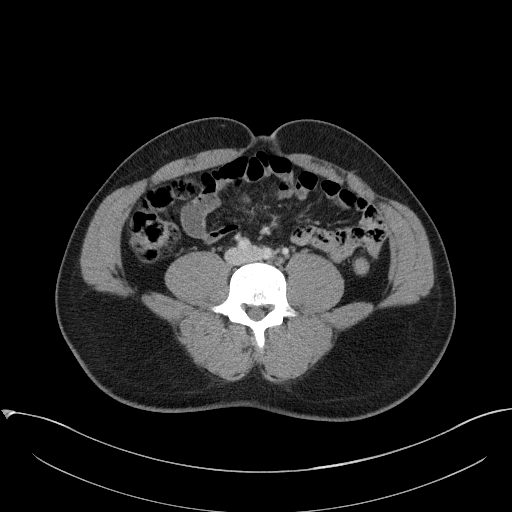
[im 55/95  soft-tissue]
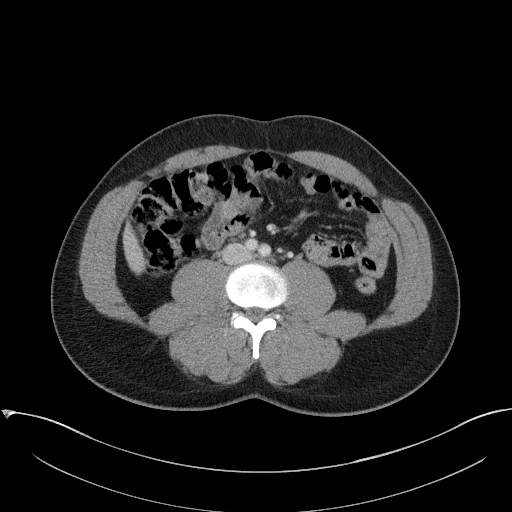
[im 60/95  soft-tissue]
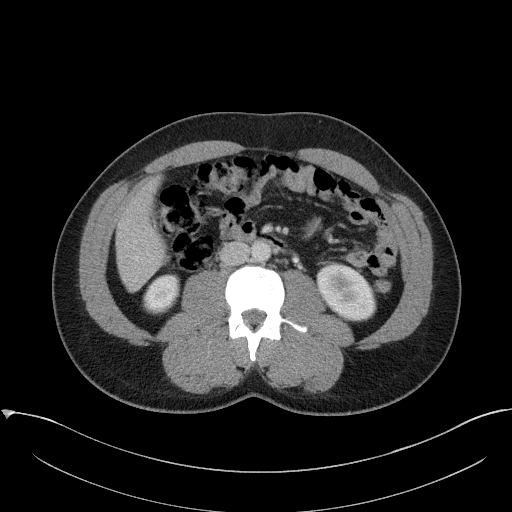
[im 60/95  bone]
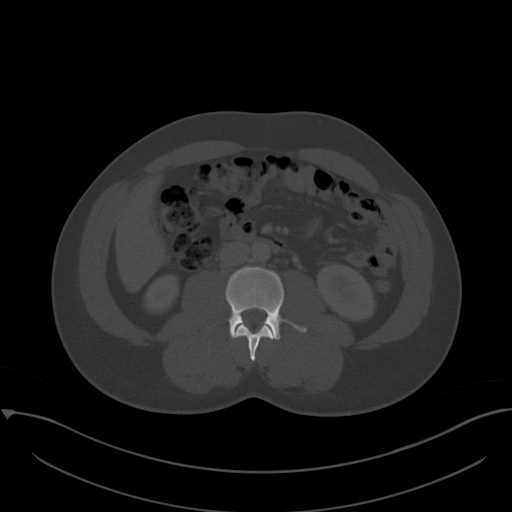
[im 70/95  soft-tissue]
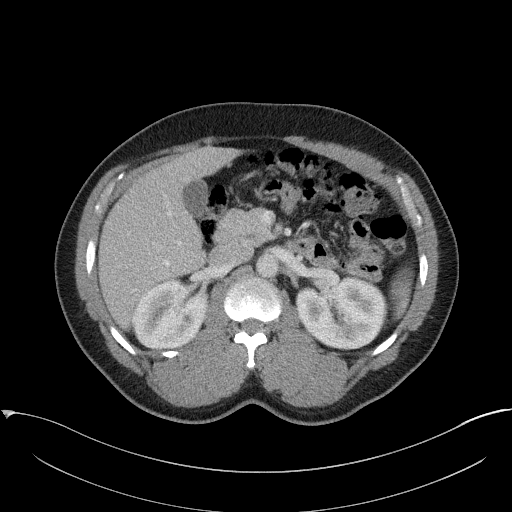
[im 75/95  soft-tissue]
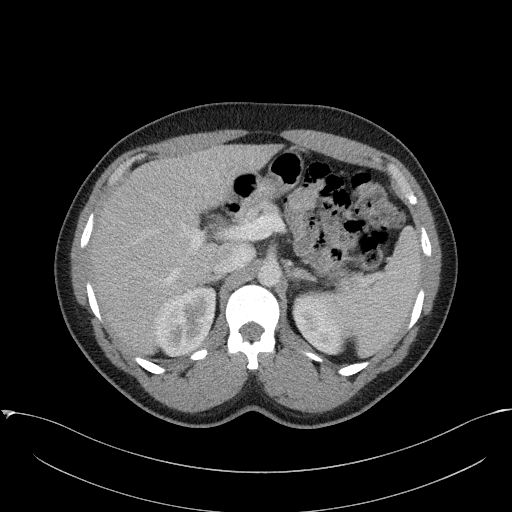
[im 80/95  soft-tissue]
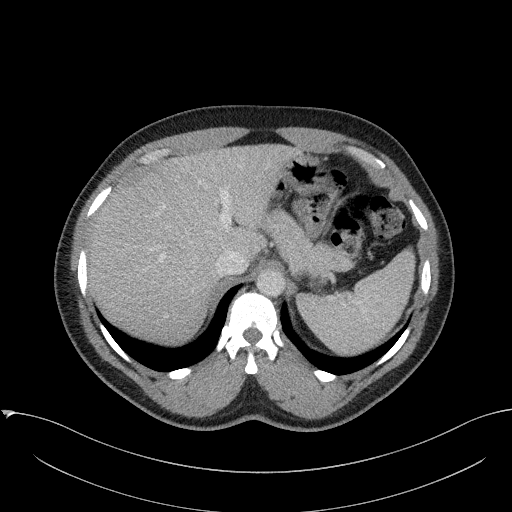
[im 90/95  soft-tissue]
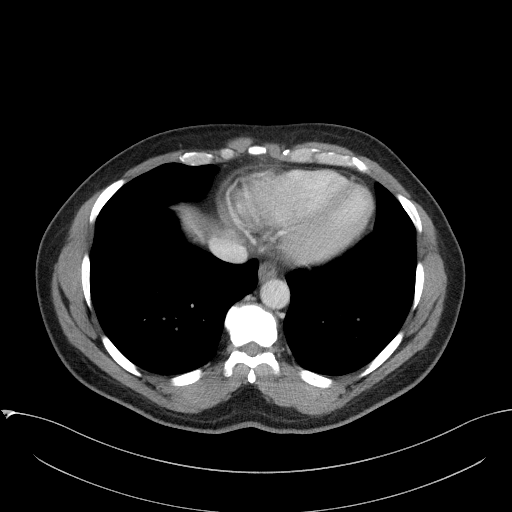

[Series 6: a/p w/ cor · coronal · 0.75mm/px · 3 of 151 slices shown]
[im 51/151  soft-tissue]
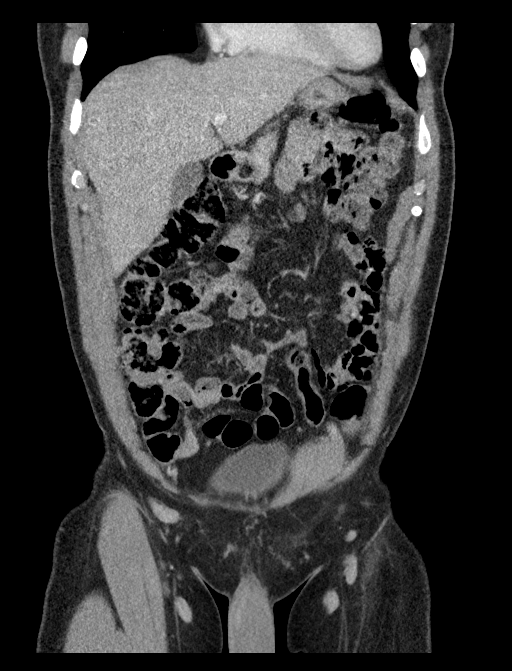
[im 67/151  soft-tissue]
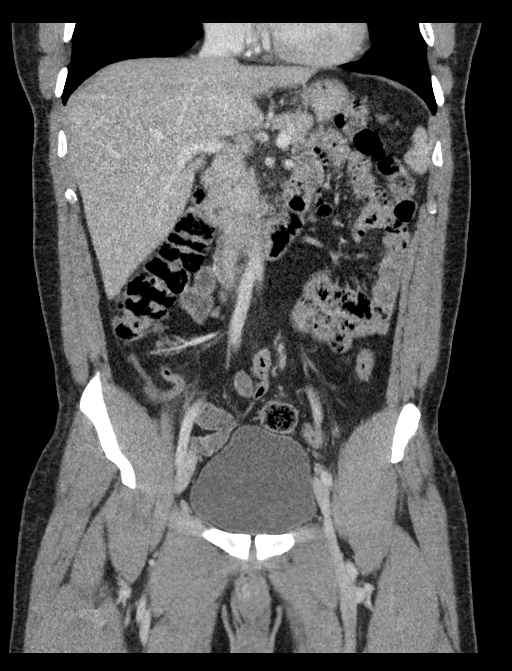
[im 84/151  soft-tissue]
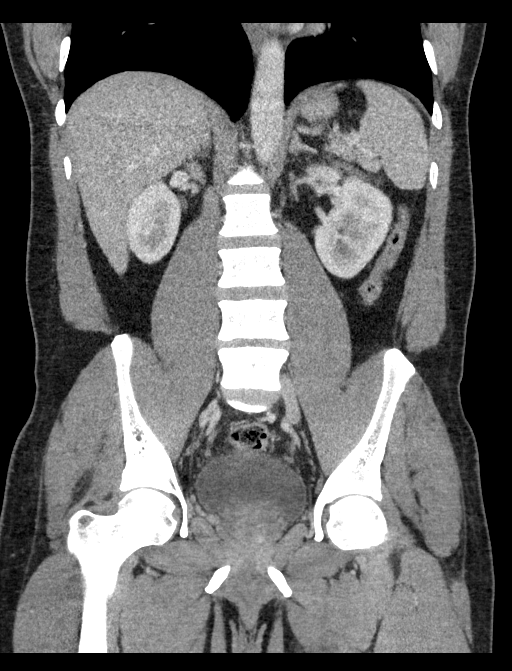

[16 of 46 positions shown; findings below may reference images not displayed]

FINDINGS: Lower chest: Lung bases clear.  Heart size within normal limits

Hepatobiliary: Normal liver.  Gallbladder and bile ducts normal

Pancreas: Negative

Spleen: Negative

Adrenals/Urinary Tract: Normal kidneys. No renal mass or obstruction
or stone. Normal bladder

Stomach/Bowel: Stomach and small bowel normal. Negative for bowel
obstruction. Normal appendix. Nondilated colon. Possible soft tissue
mass in the mid rectum which could represent neoplasm. Correlate
with endoscopy.

Vascular/Lymphatic: Negative

Reproductive: Mild prostate enlargement. Seminal vesicles also
prominent

Other: Negative for free fluid or adenopathy

Musculoskeletal: Negative
IMPRESSION: Possible mass in the rectum compatible carcinoma. Endoscopy
recommended for further evaluation. Otherwise negative.

## 2019-06-27 NOTE — Progress Notes (Signed)
[place]   Telephone:([phone]) [phone] Fax:(336) [phone]   Clinic Follow up Note   Patient Care Team: Eulas Post, MD as PCP - General (Family Medicine) Arta Silence, MD as Consulting Physician (Gastroenterology) Michael Boston, MD as Consulting Physician (General Surgery) Kyung Rudd, MD as Consulting Physician (Radiation Oncology) Truitt Merle, MD as Consulting Physician (Oncology)  Date of Service:  07/03/2019  CHIEF COMPLAINT: F/u of rectal cancer  SUMMARY OF ONCOLOGIC HISTORY: Oncology History Overview Note  Cancer Staging Rectal adenocarcinoma [facility] [place]) Staging form: Colon and Rectum, AJCC 8th Edition - Clinical stage from 07/06/2017: Stage IIIB (cT3, cN1, cM0) - Signed by [name], NP on 07/10/2017    Rectal adenocarcinoma s/p robotic LAR resection 10/10/2017  07/03/2017 Imaging   CT ABD PELVIS W CONTRAST IMPRESSION: Possible mass in the rectum compatible carcinoma. Endoscopy recommended for further evaluation. Otherwise negative.   07/03/2017 Tumor Marker   CEA 2.7   07/05/2017 Initial Biopsy   Rectum biopsy -invasive adenocarcinoma, poorly differentiated   07/05/2017 Procedure   Colonoscopy Per Dr. Paulita Fujita Findings: The perianal and digital rectal examinations were normal.  Internal hemorrhoids were found during retroflexion. The hemorrhoids were moderate. No additional abnormalities were found on retroflexion.  A fungating, sessile and ulcerated partially obstructing large mass was found in the recto-sigmoid colon.The mass was partially circumferential (involving two-thirds of the lumen circumference). Oozing was present. Area was successfully injected with 2 mL [place] ink for tattooing. This was biopsied with a cold forceps for histology.   07/05/2017 Initial Diagnosis   Rectal adenocarcinoma ([place])   07/06/2017 Imaging   Staging MRI ABD/PELVIS revealed clinical stage T3N1M0, IIIb    07/18/2017 - 08/24/2017 Radiation Therapy   Radaition  with Dr Lisbeth Renshaw     Radiation treatment dates:   07/18/2017 - 08/24/2017  Site/dose:   The rectum was treated to 45 Gy in 25 fractions of 1.8 Gy, followed by a 5.4 Gy boost in 3 fractions to yield a total dose of 50.4 Gy.  Narrative: The patient tolerated radiation treatment relatively well.   He had rectal bleeding with clots that was bright red after bowel movements. He reported loose stools that were painful. The skin to the radiation site was irritated, but no skin breakage was noted.    07/18/2017 Imaging   CT CHEST  IMPRESSION: Negative. No evidence of thoracic metastatic disease or other significant abnormality.    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  The Hereditary Gene Panel offered by [facility] includes sequencing and/or deletion duplication testing of the following 47 genes: APC, ATM, AXIN2, BARD1, BMPR1A, BRCA1, BRCA2, BRIP1, CDH1, CDK4, CDKN2A (p14ARF), CDKN2A (p16INK4a), CHEK2, CTNNA1, DICER1, EPCAM (Deletion/duplication testing only), GREM1 (promoter region deletion/duplication testing only), KIT, MEN1, MLH1, MSH2, MSH3, MSH6, MUTYH, NBN, NF1, NHTL1, PALB2, PDGFRA, PMS2, POLD1, POLE, PTEN, RAD50, RAD51C, RAD51D, SDHB, SDHC, SDHD, SMAD4, SMARCA4. STK11, TP53, TSC1, TSC2, and VHL.  The following genes were evaluated for sequence changes only: SDHA and HOXB13 c.251G>A variant only.  The report date is August 27, 2017.    10/10/2017 Surgery   XI ROBOTIC ASSISTED LOWER ANTERIOR RESECTION AND RIGID PROCTOSCOPY bu Dr. GrJohney Mainend Dr. WhDema Severin12/19/18    10/10/2017 Pathology Results       Diagnosis 10/10/17  1. Colon, segmental resection for tumor, sigmoid SMALL FOCUS OF  COLONIC GLANDS WITH HIGH GRADE DYSPLASIA POST NEOADJUVANT CHEMORADIATION THERAPY BIOPSY SITE WITH CHRONIC INFLAMMATION  NO RESIDULE INVASIVE CARCINOMA PRESENT TWENTY-TWO BENIGN LYMPH NODES (0/22) 2. Colon, resection margin (donut), final distal BENIGN COLONIC TISSUE   11/19/2017 - 03/04/2018 Adjuvant Chemotherapy   He started adjuvant 2000 mg Xeloda BID 2 weeks on 1 week off on 11/19/17 and completed 5 cycles on 03/04/18   04/08/2018 Imaging   CT AP W Contrast 04/08/18 IMPRESSION: 1. Postoperative and post treatment related changes in the low anatomic pelvis, as above. Today's study will serve as a new baseline for future follow-up examinations. At this time, there is no definitive evidence to strongly suggest local recurrence of disease or definite metastatic disease in the abdomen or pelvis. 2. Mild pancreatic ductal dilatation. This is of uncertain etiology and significance. No obstructing lesion identified in the pancreatic head. Correlation with nonemergent MRI of the abdomen with and without IV gadolinium with MRCP is suggested in the near future to evaluate for the etiology of this dilatation.    10/21/2018 Imaging   CT CAP W Contrast 10/21/18 IMPRESSION: Status post low anterior resection with suspected radiation changes in the presacral space. No evidence of recurrent or metastatic disease.      CURRENT THERAPY:  Surveillance  INTERVAL HISTORY:  Paul [name] is here for a follow up rectal cancer. He presents to the clinic alone. He notes he is doing well. He notes he is still having irregular BMs. He notes he can go 4-6 times and other 2-3 times. Sometimes it is loose stool. He has tried miralax when he has constipation and that does not work. Prunes or prune juice works for his constipation. When constipation he will have abdominal cramps. He notes with fasting he will have less gas and BMs. He denies blood in stool. He otherwise has no concerns. He is still eating adequately and able to gain weight. I reviewed his medication list with him.  He notes having penile numbness since  surgery. He notes he got a call from his HR and his FMLA is about to expire.    REVIEW OF SYSTEMS:   Constitutional: Denies fevers, chills or abnormal weight loss Eyes: Denies blurriness of vision Ears, nose, mouth, throat, and face: Denies mucositis or sore throat Respiratory: Denies cough, dyspnea or wheezes Cardiovascular: Denies palpitation, chest discomfort or lower extremity swelling UA: (+) Penile numbness Gastrointestinal:  Denies nausea, heartburn (+) Irregular bowel movements with constipation/loose stool (+) occasional abdominal cramps (+) gas Skin: Denies abnormal skin rashes Lymphatics: Denies new lymphadenopathy or easy bruising Neurological:Denies numbness, tingling or new weaknesses Behavioral/Psych: Mood is stable, no new changes  All other systems were reviewed with the patient and are negative.  MEDICAL HISTORY:  Past Medical History:  Diagnosis Date  . Allergy    grass, dander  . Essential hypertension 07/03/2017  . Family history of prostate cancer   . GERD (gastroesophageal reflux disease)   . History of meningitis   . Hypertension   . 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 COLONOSCOPY WITH PROPOFOL Left 07/05/2017   Procedure: COLONOSCOPY WITH PROPOFOL;  Surgeon: Arta Silence, MD;  Location: [place];  Service: Endoscopy;  Laterality: Left;  . PROCTOSCOPY N/A 10/10/2017   Procedure: RIGID PROCTOSCOPY;  Surgeon: Michael Boston, MD;  Location: [facility];  Service: General;  Laterality: N/A;  . XI ROBOTIC  ASSISTED LOWER ANTERIOR RESECTION N/A 10/10/2017   Procedure: XI ROBOTIC ASSISTED LOWER ANTERIOR RESECTION;  Surgeon: Michael Boston, MD;  Location: [facility];  Service: General;  Laterality: N/A;    I have reviewed the social history and family history with the patient and they are unchanged from  previous note.  ALLERGIES:  is allergic to chloroquine.  MEDICATIONS:  Current Outpatient Medications  Medication Sig Dispense Refill  . amLODipine (NORVASC) 5 MG tablet TAKE 1 TABLET DAILY 90 tablet 3  . calcium carbonate (OS-CAL - DOSED IN MG OF ELEMENTAL CALCIUM) 1250 (500 Ca) MG tablet Take 1 tablet by mouth daily as needed (for bones).    . Calcium Citrate-Vitamin D (CALCIUM CITRATE+D3 PETITES PO) Take 1 tablet by mouth daily.    . cetirizine (ZYRTEC) 10 MG tablet Take 10 mg by mouth daily as needed for allergies.    Marland Kitchen GARLIC PO Take 1 capsule by mouth daily.     . Multiple Vitamin (MULTIVITAMIN WITH MINERALS) TABS tablet Take 1 tablet by mouth daily.    . naproxen (NAPROSYN) 500 MG tablet Take 1 tablet (500 mg total) by mouth every 12 (twelve) hours as needed for mild pain or moderate pain. 30 tablet 1  . omeprazole (PRILOSEC) 40 MG capsule Take 40 mg by mouth daily as needed (heart burn).      No current facility-administered medications for this visit.     PHYSICAL EXAMINATION: ECOG PERFORMANCE STATUS: 0 - Asymptomatic  Vitals:   07/03/19 1150  BP: (!) 135/95  Pulse: 66  Resp: 17  Temp: 98.7 F (37.1 C)  SpO2: 100%   Filed Weights   07/03/19 1150  Weight: 203 lb (92.1 kg)    GENERAL:alert, no distress and comfortable SKIN: skin color, texture, turgor are normal, no rashes or significant lesions EYES: normal, Conjunctiva are pink and non-injected, sclera clear  NECK: supple, thyroid normal size, non-tender, without nodularity LYMPH:  no palpable lymphadenopathy in the cervical, axillary or inguinal LUNGS: clear to auscultation and percussion with normal breathing effort HEART: regular rate & rhythm and no murmurs and no lower extremity edema ABDOMEN:abdomen soft, non-tender and normal bowel sounds Musculoskeletal:no cyanosis of digits and no clubbing  NEURO: alert & oriented x 3 with fluent speech, no focal motor/sensory deficits RECTAL: No palpable mass. No blood  on glove. Normal stool present. Benign exam   LABORATORY DATA:  I have reviewed the data as listed CBC Latest Ref Rng & Units 07/03/2019 10/22/2018 07/24/2018  WBC 4.0 - 10.5 K/uL 3.5(L) 2.7(L) 2.5(L)  Hemoglobin 13.0 - 17.0 g/dL 14.0 14.3 14.0  Hematocrit 39.0 - 52.0 % 41.0 42.3 41.3  Platelets 150 - 400 K/uL 175 182 166     CMP Latest Ref Rng & Units 07/03/2019 10/22/2018 07/24/2018  Glucose 70 - 99 mg/dL 93 102(H) 93  BUN 6 - 20 mg/dL '12 8 9  ' Creatinine 0.61 - 1.24 mg/dL 0.93 0.83 0.88  Sodium 135 - 145 mmol/L 140 143 141  Potassium 3.5 - 5.1 mmol/L 4.4 4.2 4.1  Chloride 98 - 111 mmol/L 103 106 104  CO2 22 - 32 mmol/L '29 28 31  ' Calcium 8.9 - 10.3 mg/dL 9.5 9.6 9.2  Total Protein 6.5 - 8.1 g/dL 7.3 7.4 7.5  Total Bilirubin 0.3 - 1.2 mg/dL 0.6 0.6 0.7  Alkaline Phos 38 - 126 U/L 65 75 82  AST 15 - 41 U/L '30 31 27  ' ALT 0 - 44 U/L 25 32 26      RADIOGRAPHIC  STUDIES: I have personally reviewed the radiological images as listed and agreed with the findings in the report. No results found.   ASSESSMENT & PLAN:  Paul [name] is a 53 y.o. male with   1. Poorly differentiated invasive rectal adenocarcinoma, cT3N1M0, stage IIIb, ypT0N0 -He was diagnosed in 06/2017. He is s/p concurrent chemoradiation with Xeloda, Surgical resection and 5 cycles of adjuvant Xeloda.  -He has recovered well fromsurgery and chemotreatment  -He completed repeat colonoscopy in 10/11/2018 through [facility]. Will repeat in 3 years.  -He is overall stable with irregular bowel movements, gas and penile numbness secondary to surgery and RT.  -He is clinically doing well. Labs reviewed, CBC and CMP WNL except WBC 3.5. Iron panel still pending. Physical exam unremarkable, negative rectal exam. There is no clinical concern for recurrence.  -I discussed the longer he remain NED the less likely his cancer will recur. He is still at higher risk for first 2-3 years. He will continue cancer surveillance. Plan to have  next surveillance CT scan in 09/2019 -F/u in 3 months   -He opted to receive flu shot today   2. Anemia; iron deficiency  -He previously had rectal bleeding that has not recurred recently. -On oral iron, will continue,but he is not very compliant. -Anemia resolved., ferritin still little low (07/03/19)  3. 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   4. Constipation/loose stool, gas, sexual dysfunction  -secondary to his surgery and radiation.  -He still has irregular bowel movements with intermittent diarrhea/constipation along with gas, but so far manageable. -Miralax does not help his constipation, but Prunes do. Will continue as needed.  -I previously recommended OTC Gas-X for has gas and discomfort. I also recommend he work on his diet to reduce gas -I recommend Pelvic Floor exercisel. He is interested. I will refer him.  -I will refer him to urologist for his sexual dysfunction which is secondary to treatment. He is agreeable   5. HTN  -he is currently taking 5 mg amlodipinedaily.  -Mostly controlled    PLAN: -He is clinically doing well, no concerns for recurrence  -Proceed with flu shot today  -Send pelvic floor PT referral  -Send urology referral  -F/u in 09/2019 with Lab, CT CAP W Contrast a few days before    No problem-specific Assessment & Plan notes found for this encounter.   Orders Placed This Encounter  Procedures  . CT Abdomen Pelvis W Contrast    Standing Status:   Future    Standing Expiration Date:   07/02/2020    Order Specific Question:   If indicated for the ordered procedure, I authorize the administration of contrast media per Radiology protocol    Answer:   Yes    Order Specific Question:   Preferred imaging location?    Answer:   [facility]    Order Specific Question:   Is Oral Contrast requested for this exam?    Answer:   Yes,  Per Radiology protocol    Order Specific Question:   Radiology Contrast Protocol - do NOT remove file path    Answer:   \\[email or web address]  . CT Chest W Contrast    Standing Status:   Future    Standing Expiration Date:   07/02/2020    Order Specific Question:   If indicated for the ordered procedure, I authorize the administration of contrast media per Radiology protocol  Answer:   Yes    Order Specific Question:   Preferred imaging location?    Answer:   [facility]    Order Specific Question:   Radiology Contrast Protocol - do NOT remove file path    Answer:   \\[email or web address]  . Ambulatory referral to Physical Therapy    Referral Priority:   Routine    Referral Type:   Physical Medicine    Referral Reason:   Specialty Services Required    Requested Specialty:   Physical Therapy    Number of Visits Requested:   1  . Ambulatory referral to Urology    Referral Priority:   Routine    Referral Type:   Consultation    Referral Reason:   Specialty Services Required    Requested Specialty:   Urology    Number of Visits Requested:   1   All questions were answered. The patient knows to call the clinic with any problems, questions or concerns. No barriers to learning was detected. I spent 20 minutes counseling the patient face to face. The total time spent in the appointment was 25 minutes and more than 50% was on counseling and review of test results     Truitt Merle, MD 07/03/2019   I, Joslyn Devon, am acting as scribe for Truitt Merle, MD.   I have reviewed the above documentation for accuracy and completeness, and I agree with the above.

## 2019-07-03 ENCOUNTER — Other Ambulatory Visit: Payer: Self-pay

## 2019-07-03 ENCOUNTER — Encounter: Payer: Self-pay | Admitting: Hematology

## 2019-07-03 ENCOUNTER — Inpatient Hospital Stay (HOSPITAL_BASED_OUTPATIENT_CLINIC_OR_DEPARTMENT_OTHER): Payer: BC Managed Care – PPO | Admitting: Hematology

## 2019-07-03 ENCOUNTER — Inpatient Hospital Stay: Payer: BC Managed Care – PPO | Attending: Hematology

## 2019-07-03 ENCOUNTER — Telehealth: Payer: Self-pay | Admitting: Hematology

## 2019-07-03 VITALS — BP 135/95 | HR 66 | Temp 98.7°F | Resp 17 | Ht 69.5 in | Wt 203.0 lb

## 2019-07-03 DIAGNOSIS — Z23 Encounter for immunization: Secondary | ICD-10-CM

## 2019-07-03 DIAGNOSIS — D500 Iron deficiency anemia secondary to blood loss (chronic): Secondary | ICD-10-CM

## 2019-07-03 DIAGNOSIS — C20 Malignant neoplasm of rectum: Secondary | ICD-10-CM | POA: Insufficient documentation

## 2019-07-03 DIAGNOSIS — I10 Essential (primary) hypertension: Secondary | ICD-10-CM | POA: Diagnosis not present

## 2019-07-03 DIAGNOSIS — D509 Iron deficiency anemia, unspecified: Secondary | ICD-10-CM | POA: Diagnosis not present

## 2019-07-03 LAB — CBC WITH DIFFERENTIAL/PLATELET
Abs Immature Granulocytes: 0.02 10*3/uL (ref 0.00–0.07)
Basophils Absolute: 0 10*3/uL (ref 0.0–0.1)
Basophils Relative: 0 %
Eosinophils Absolute: 0.2 10*3/uL (ref 0.0–0.5)
Eosinophils Relative: 4 %
HCT: 41 % (ref 39.0–52.0)
Hemoglobin: 14 g/dL (ref 13.0–17.0)
Immature Granulocytes: 1 %
Lymphocytes Relative: 20 %
Lymphs Abs: 0.7 10*3/uL (ref 0.7–4.0)
MCH: 29.5 pg (ref 26.0–34.0)
MCHC: 34.1 g/dL (ref 30.0–36.0)
MCV: 86.5 fL (ref 80.0–100.0)
Monocytes Absolute: 0.3 10*3/uL (ref 0.1–1.0)
Monocytes Relative: 9 %
Neutro Abs: 2.3 10*3/uL (ref 1.7–7.7)
Neutrophils Relative %: 66 %
Platelets: 175 10*3/uL (ref 150–400)
RBC: 4.74 MIL/uL (ref 4.22–5.81)
RDW: 12.7 % (ref 11.5–15.5)
WBC: 3.5 10*3/uL — ABNORMAL LOW (ref 4.0–10.5)
nRBC: 0 % (ref 0.0–0.2)

## 2019-07-03 LAB — COMPREHENSIVE METABOLIC PANEL
ALT: 25 U/L (ref 0–44)
AST: 30 U/L (ref 15–41)
Albumin: 4.5 g/dL (ref 3.5–5.0)
Alkaline Phosphatase: 65 U/L (ref 38–126)
Anion gap: 8 (ref 5–15)
BUN: 12 mg/dL (ref 6–20)
CO2: 29 mmol/L (ref 22–32)
Calcium: 9.5 mg/dL (ref 8.9–10.3)
Chloride: 103 mmol/L (ref 98–111)
Creatinine, Ser: 0.93 mg/dL (ref 0.61–1.24)
GFR calc Af Amer: >60 mL/min (ref >60)
GFR calc non Af Amer: >60 mL/min (ref >60)
Glucose, Bld: 93 mg/dL (ref 70–99)
Potassium: 4.4 mmol/L (ref 3.5–5.1)
Sodium: 140 mmol/L (ref 135–145)
Total Bilirubin: 0.6 mg/dL (ref 0.3–1.2)
Total Protein: 7.3 g/dL (ref 6.5–8.1)

## 2019-07-03 LAB — FERRITIN: Ferritin: 17 ng/mL — ABNORMAL LOW (ref 24–336)

## 2019-07-03 LAB — IRON AND TIBC
Iron: 117 ug/dL (ref 42–163)
Saturation Ratios: 32 % (ref 20–55)
TIBC: 363 ug/dL (ref 202–409)
UIBC: 245 ug/dL (ref 117–376)

## 2019-07-03 MED ORDER — INFLUENZA VAC SPLIT QUAD 0.5 ML IM SUSY
0.5000 mL | PREFILLED_SYRINGE | Freq: Once | INTRAMUSCULAR | Status: AC
  Administered 2019-07-03: 0.5 mL via INTRAMUSCULAR
  Filled 2019-07-03: qty 0.5

## 2019-07-03 NOTE — Telephone Encounter (Signed)
Scheduled per 09/10 los, patient received after visit summary and calender.

## 2019-07-05 ENCOUNTER — Encounter: Payer: Self-pay | Admitting: Hematology

## 2019-07-05 ENCOUNTER — Other Ambulatory Visit: Payer: Self-pay | Admitting: Hematology

## 2019-07-05 DIAGNOSIS — C20 Malignant neoplasm of rectum: Secondary | ICD-10-CM

## 2019-07-13 ENCOUNTER — Encounter: Payer: Self-pay | Admitting: Hematology

## 2019-07-17 ENCOUNTER — Ambulatory Visit: Payer: BC Managed Care – PPO | Attending: Hematology | Admitting: Physical Therapy

## 2019-07-17 ENCOUNTER — Encounter: Payer: Self-pay | Admitting: Physical Therapy

## 2019-07-17 ENCOUNTER — Other Ambulatory Visit: Payer: Self-pay

## 2019-07-17 DIAGNOSIS — C20 Malignant neoplasm of rectum: Secondary | ICD-10-CM | POA: Insufficient documentation

## 2019-07-17 DIAGNOSIS — M6281 Muscle weakness (generalized): Secondary | ICD-10-CM | POA: Insufficient documentation

## 2019-07-17 DIAGNOSIS — R279 Unspecified lack of coordination: Secondary | ICD-10-CM | POA: Insufficient documentation

## 2019-07-17 NOTE — Patient Instructions (Signed)
Toileting Techniques for Bowel Movements (Defecation) Using your belly (abdomen) and pelvic floor muscles to have a bowel movement is usually instinctive.  Sometimes people can have problems with these muscles and have to relearn proper defecation (emptying) techniques.  If you have weakness in your muscles, organs that are falling out, decreased sensation in your pelvis, or ignore your urge to go, you may find yourself straining to have a bowel movement.  You are straining if you are: . holding your breath or taking in a huge gulp of air and holding it  . keeping your lips and jaw tensed and closed tightly . turning red in the face because of excessive pushing or forcing . developing or worsening your  hemorrhoids . getting faint while pushing . not emptying completely and have to defecate many times a day  If you are straining, you are actually making it harder for yourself to have a bowel movement.  Many people find they are pulling up with the pelvic floor muscles and closing off instead of opening the anus. Due to lack pelvic floor relaxation and coordination the abdominal muscles, one has to work harder to push the feces out.  Many people have never been taught how to defecate efficiently and effectively.  Notice what happens to your body when you are having a bowel movement.  While you are sitting on the toilet pay attention to the following areas: . Jaw and mouth position . Angle of your hips   . Whether your feet touch the ground or not . Arm placement  . Spine position . Waist . Belly tension . Anus (opening of the anal canal)  An Evacuation/Defecation Plan   Here are the 4 basic points:  1. Lean forward enough for your elbows to rest on your knees 2. Support your feet on the floor or use a low stool if your feet don't touch the floor  3. Push out your belly as if you have swallowed a beach ball-you should feel a widening of your waist 4. Open and relax your pelvic floor muscles,  rather than tightening around the anus       The following conditions my require modifications to your toileting posture:  . If you have had surgery in the past that limits your back, hip, pelvic, knee or ankle flexibility . Constipation   Your healthcare practitioner may make the following additional suggestions and adjustments:  1) Sit on the toilet  a) Make sure your feet are supported. b) Notice your hip angle and spine position-most people find it effective to lean forward or raise their knees, which can help the muscles around the anus to relax  c) When you lean forward, place your forearms on your thighs for support  2) Relax suggestions a) Breath deeply in through your nose and out slowly through your mouth as if you are smelling the flowers and blowing out the candles. b) To become aware of how to relax your muscles, contracting and releasing muscles can be helpful.  Pull your pelvic floor muscles in tightly by using the image of holding back gas, or closing around the anus (visualize making a circle smaller) and lifting the anus up and in.  Then release the muscles and your anus should drop down and feel open. Repeat 5 times ending with the feeling of relaxation. c) Keep your pelvic floor muscles relaxed; let your belly bulge out. d) The digestive tract starts at the mouth and ends at the anal opening, so   be sure to relax both ends of the tube.  Place your tongue on the roof of your mouth with your teeth separated.  This helps relax your mouth and will help to relax the anus at the same time.  3) Empty (defecation) a) Keep your pelvic floor and sphincter relaxed, then bulge your anal muscles.  Make the anal opening wide.  b) Stick your belly out as if you have swallowed a beach ball. c) Make your belly wall hard using your belly muscles while continuing to breathe. Doing this makes it easier to open your anus. d) Breath out and give a grunt (or try using other sounds such as  ahhhh, shhhhh, ohhhh or grrrrrrr).  4) Finish a) As you finish your bowel movement, pull the pelvic floor muscles up and in.  This will leave your anus in the proper place rather than remaining pushed out and down. If you leave your anus pushed out and down, it will start to feel as though that is normal and give you incorrect signals about needing to have a bowel movement. [facility] [address], [address] [place], [place] [address] Phone # [phone] Fax [phone]  

## 2019-07-17 NOTE — Therapy (Signed)
[facility] [address], [place], [place], [address] Phone: [phone]   Fax:  [phone]  Physical Therapy Evaluation  Patient Details  Name: Paul [name] MRN: [id number] Date of Birth: 01-Jul-1966 Referring Provider (PT): Dr. Truitt Merle   Encounter Date: 07/17/2019  PT End of Session - 07/17/19 0847    Visit Number  1    Date for PT Re-Evaluation  10/09/19    Authorization Type  BCBS    Authorization - Visit Number  1    Authorization - Number of Visits  90    PT Start Time  0800    PT Stop Time  [id number]    PT Time Calculation (min)  50 min    Activity Tolerance  Patient tolerated treatment well;No increased pain    Behavior During Therapy  WFL for tasks assessed/performed       Past Medical History:  Diagnosis Date  . Allergy    grass, dander  . Essential hypertension 07/03/2017  . Family history of prostate cancer   . GERD (gastroesophageal reflux disease)   . History of meningitis   . Hypertension   . 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 COLONOSCOPY WITH PROPOFOL Left 07/05/2017   Procedure: COLONOSCOPY WITH PROPOFOL;  Surgeon: Arta Silence, MD;  Location: [place];  Service: Endoscopy;  Laterality: Left;  . PROCTOSCOPY N/A 10/10/2017   Procedure: RIGID PROCTOSCOPY;  Surgeon: Michael Boston, MD;  Location: [facility];  Service: General;  Laterality: N/A;  . XI ROBOTIC ASSISTED LOWER ANTERIOR RESECTION N/A 10/10/2017   Procedure: XI ROBOTIC ASSISTED LOWER ANTERIOR RESECTION;  Surgeon: Michael Boston, MD;  Location: [facility];  Service: General;  Laterality: N/A;    There were no vitals filed for this visit.   Subjective Assessment - 07/17/19 0809    Subjective  Since the surgery I have had some change in my bowel movement. I have the feeling to have a BM but  nothing has happened. Has to sit 45 minutes.I have alot of gas. Type 1, 4 and 5 stool. Patient has 2-6 times per day. Trouble emptying bladder and erections.    Patient Stated Goals  Have normal BM    Currently in Pain?  Yes    Pain Score  2     Pain Location  Abdomen    Pain Orientation  Lower    Pain Descriptors / Indicators  Discomfort;Pressure    Pain Type  Chronic pain    Pain Onset  More than a month ago    Pain Frequency  Intermittent    Aggravating Factors   urinating    Pain Relieving Factors  no urinating    Multiple Pain Sites  No         [facility] PT Assessment - 07/17/19 0001      Assessment   Medical Diagnosis  C20 Rectal adenocarcinoma    Referring Provider (PT)  Dr. Truitt Merle    Onset Date/Surgical Date  08/24/17    Prior Therapy  none      Precautions   Precautions  Other (comment)    Precaution Comments  rectal cancer with radiation and chemotherapy      Restrictions   Weight Bearing Restrictions  No      Balance Screen   Has the patient fallen in the past 6 months  No  Has the patient had a decrease in activity level because of a fear of falling?   No    Is the patient reluctant to leave their home because of a fear of falling?   No      Home Film/video editor residence      Prior Function   Level of Independence  Independent    Vocation  Full time employment    Vocation Requirements  on his feet    Leisure  abdominal workout      Cognition   Overall Cognitive Status  Within Functional Limits for tasks assessed      Posture/Postural Control   Posture/Postural Control  No significant limitations      ROM / Strength   AROM / PROM / Strength  AROM;PROM;Strength      AROM   Lumbar Flexion  decreased by 25%    Lumbar Extension  decreased by 25%    Lumbar - Right Side Bend  decreased by 25%    Lumbar - Left Side Bend  decreased by 25%    Lumbar - Right Rotation  decreased by 25%    Lumbar - Left Rotation  decreased by 25%       PROM   Right Hip External Rotation   20    Left Hip External Rotation   35      Strength   Overall Strength Comments  when contracts abdomen will bulge the abdomen    Right Hip Extension  4-/5    Right Hip ABduction  3-/5    Left Hip Extension  4-/5    Left Hip ABduction  5/5      Palpation   Spinal mobility  decreased movement of L1-L5    SI assessment   ASIS are equal    Palpation comment  tenderness in the lower abdomen with tightness                Objective measurements completed on examination: See above findings.    Pelvic Floor Special Questions - 07/17/19 0001    Urinary Leakage  No    Fecal incontinence  No    Skin Integrity  Intact    Pelvic Floor Internal Exam  to assess pelvic floor and treatment    Exam Type  Rectal    Palpation  when asked to bear down patient will tigthen the muscles, decreased contraction of the internal sphincter, tightness in the pelvic floor muscles. When ask patient to relax the anus there still are spasms    Strength  weak squeeze, no lift   when contract can feel the penis tuck in              PT Education - 07/17/19 0846    Education Details  toileting technique    Person(s) Educated  Patient    Methods  Explanation;Demonstration;Verbal cues;Handout    Comprehension  Returned demonstration;Verbalized understanding       PT Short Term Goals - 07/17/19 1435      PT SHORT TERM GOAL #1   Title  independent with initial HEP    Time  4    Period  Weeks    Status  New    Target Date  08/14/19      PT SHORT TERM GOAL #2   Title  understand correct toileting technique to relax the pelvic floor and diaphragmatic breathing    Time  4    Period  Weeks  Status  New    Target Date  08/14/19      PT SHORT TERM GOAL #3   Title  ability to relax pelvic floor without spasms due to calming the nervous system down    Time  4    Period  Weeks    Status  New    Target Date  08/14/19      PT SHORT TERM GOAL #4    Title  decreased time spent on commode t o</= 30  minutes for a bowel movement    Time  4    Period  Weeks    Status  New    Target Date  08/14/19        PT Long Term Goals - 07/17/19 1437      PT LONG TERM GOAL #1   Title  independent with advanced HEP    Time  12    Period  Weeks    Status  New    Target Date  10/09/19      PT LONG TERM GOAL #2   Title  able to fully empty his bladder  due to relaxation of the pelvic floor and elongation of pelvic floor muscles    Time  12    Period  Weeks    Status  New    Target Date  10/09/19      PT LONG TERM GOAL #3   Title  able to fully empty his bowels with sitting on the commode </= 15 minutes due to relaxation of the pelvic floor    Time  12    Period  Weeks    Status  New    Target Date  10/09/19      PT LONG TERM GOAL #4   Title  when asked to bulge the pelvic floor there are not spasms    Time  12    Period  Weeks    Status  New    Target Date  10/09/19      PT LONG TERM GOAL #5   Title  able to perform core strengthening without bulge the abdomen and putting pressure on the pelvic floor    Time  12    Period  Weeks    Status  New    Target Date  10/09/19             Plan - 07/17/19 0848    Clinical Impression Statement  Patient is a 53 year old male s/p rectal cancer with radiation and chemotherapy for treatment. Patient had Robotic assisted LAR 10/10/2017. Patient reports abdominal pain is 2/10 when he feels pressure in the bladder. Patient has difficulty with emptying his bladder fully. Patient is not able to fully empty his bowels and will sit on the commode for 45 minutes. Patient reports his stool is Type 1, 4,and 5. Patient will go to the bathroom 2-6 times per day. Patient can have the feeling of a bowel movement but nothing will happen. Pelvic floor strength is 2/5 with 2 second contraction. When ask patient to bulge  the pelvic floor he will contract instead. When pelvic floor is at rest there is  spasms. The internal anal sphincter has decreased contraction compared to the external anal sphincter. Patient has tightness in the pelvic floor muscles. Lumbar ROM is decreased by 25%. Hip ER on right is 20 degrees and left is 35 degrees. Decreased mobility of L1-L5. Patient has difficulty with having erections. Patient will benefit from skilled  therapy to improve pelvic floor relaxation and coordination while improving strength.    Personal Factors and Comorbidities  Comorbidity 1;Comorbidity 2;Comorbidity 3+;Sex    Comorbidities  rectal adenocarcinoma 07/06/2017; radiation with chemotherapy 07/19/2019-08/24/2017; chemotherapy 11/19/2017; Robotic assisted LAR 10/10/2017    Examination-Activity Limitations  Toileting    Examination-Participation Restrictions  Interpersonal Relationship    Stability/Clinical Decision Making  Evolving/Moderate complexity    Clinical Decision Making  Moderate    Rehab Potential  Excellent    PT Frequency  1x / week    PT Duration  12 weeks    PT Treatment/Interventions  Biofeedback;Therapeutic exercise;Therapeutic activities;Neuromuscular re-education;Functional mobility training;Patient/family education;Dry needling;Manual techniques;Taping;Spinal Manipulations;Joint Manipulations    PT Next Visit Plan  review toileting technque, abdominal massage, bowel health, hip stretches, hip mobilization, lumbar mobilization    Consulted and Agree with Plan of Care  Patient       Patient will benefit from skilled therapeutic intervention in order to improve the following deficits and impairments:  Decreased range of motion, Increased fascial restricitons, Increased muscle spasms, Decreased activity tolerance, Pain, Decreased scar mobility, Decreased strength, Decreased mobility  Visit Diagnosis: Muscle weakness (generalized)  Unspecified lack of coordination  Rectal adenocarcinoma [facility])     Problem List Patient Active Problem List   Diagnosis Date Noted  . Iron  deficiency anemia due to chronic blood loss 09/03/2017  . Genetic testing 08/30/2017  . Family history of prostate cancer   . 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 Essential hypertension 07/03/2017  . Elevated blood pressure 08/14/2013  . GERD (gastroesophageal reflux disease) 09/10/2012    Earlie Counts, PT 07/17/19 2:42 PM   [facility] [address], [place], [place], [address] Phone: [phone]   Fax:  [phone]  Name: Paul [name] MRN: [id number] Date of Birth: 1965-11-03

## 2019-07-18 DIAGNOSIS — H5203 Hypermetropia, bilateral: Secondary | ICD-10-CM | POA: Diagnosis not present

## 2019-07-18 DIAGNOSIS — H40033 Anatomical narrow angle, bilateral: Secondary | ICD-10-CM | POA: Diagnosis not present

## 2019-07-25 ENCOUNTER — Other Ambulatory Visit: Payer: Self-pay

## 2019-07-25 ENCOUNTER — Encounter: Payer: Self-pay | Admitting: Physical Therapy

## 2019-07-25 ENCOUNTER — Ambulatory Visit: Payer: BC Managed Care – PPO | Attending: Hematology | Admitting: Physical Therapy

## 2019-07-25 DIAGNOSIS — M6281 Muscle weakness (generalized): Secondary | ICD-10-CM | POA: Insufficient documentation

## 2019-07-25 DIAGNOSIS — R279 Unspecified lack of coordination: Secondary | ICD-10-CM | POA: Diagnosis not present

## 2019-07-25 DIAGNOSIS — C20 Malignant neoplasm of rectum: Secondary | ICD-10-CM | POA: Insufficient documentation

## 2019-07-25 NOTE — Patient Instructions (Signed)
Access Code: [email or web address]  URL: [email or web address]  Date: 07/25/2019  Prepared by: Earlie Counts   Exercises Supine Single Knee to Chest Stretch - 1 reps - 1 sets - 30 sec hold                            - 1x daily - 7x weekly Supine Double Knee to Chest - 1 reps - 1 sets - 30 sec hold - 1x daily - 7x weekly Supine Lower Trunk Rotation - 1 reps - 1 sets - 30 sec hold - 1x daily - 7x weekly Supine Figure 4 Piriformis Stretch - 2 reps - 1 sets - 30 sec hold - 1x daily - 7x weekly Cat-Camel - 10 reps - 1 sets - 1x daily - 7x weekly Child's Pose Stretch - 2 reps - 1 sets - 30 sec hold - 1x daily - 7x weekly Prone Press Up on Elbows - 1 reps - 1 sets - 30 sec hold - 1x daily                            - 7x weekly Supine Abdominal Wall Massage - 1 reps - 1 sets - 2-3 min hold - 1x daily - 7x weekly  [facility] [address], [place], [place] [address] Phone # [phone] Fax [phone]

## 2019-07-25 NOTE — Therapy (Signed)
[facility] [address] [place], [place], [address] Phone: [phone]   Fax:  [phone]  Physical Therapy Treatment  Patient Details  Name: Paul [name] MRN: [id number] Date of Birth: [birth year] Referring Provider (PT): Dr. Truitt Merle   Encounter Date: 07/25/2019  PT End of Session - 07/25/19 1057    Visit Number  2    Date for PT Re-Evaluation  10/09/19    Authorization Type  BCBS    Authorization - Visit Number  2    Authorization - Number of Visits  90    PT Start Time  [id number]    PT Stop Time  1054    PT Time Calculation (min)  39 min    Activity Tolerance  Patient tolerated treatment well;No increased pain    Behavior During Therapy  WFL for tasks assessed/performed       Past Medical History:  Diagnosis Date  . Allergy    grass, dander  . Essential hypertension 07/03/2017  . Family history of prostate cancer   . GERD (gastroesophageal reflux disease)   . History of meningitis   . Hypertension   . 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 COLONOSCOPY WITH PROPOFOL Left 07/05/2017   Procedure: COLONOSCOPY WITH PROPOFOL;  Surgeon: Arta Silence, MD;  Location: [place];  Service: Endoscopy;  Laterality: Left;  . PROCTOSCOPY N/A 10/10/2017   Procedure: RIGID PROCTOSCOPY;  Surgeon: Michael Boston, MD;  Location[facility];  Service: General;  Laterality: N/A;  . XI ROBOTIC ASSISTED LOWER ANTERIOR RESECTION N/A 10/10/2017   Procedure: XI ROBOTIC ASSISTED LOWER ANTERIOR RESECTION;  Surgeon: Michael Boston, MD;  Location: [facility];  Service: General;  Laterality: N/A;    There were no vitals filed for this visit.  Subjective Assessment - 07/25/19 1019    Subjective  I felt fine after the initial evaluation.    Patient Stated Goals  Have normal BM    Currently in  Pain?  Yes    Pain Score  2     Pain Location  Abdomen    Pain Orientation  Lower    Pain Descriptors / Indicators  Discomfort;Pressure    Pain Type  Chronic pain    Pain Onset  More than a month ago    Pain Frequency  Intermittent    Aggravating Factors   urinating    Pain Relieving Factors  not urinating                       [facility] Adult PT Treatment/Exercise - 07/25/19 0001      Self-Care   Self-Care  Other Self-Care Comments    Other Self-Care Comments   how to massage around the anus to assit in bowel movement and stroking the penis to assist in fully emptying his bladder      Neuro Re-ed    Neuro Re-ed Details   diaphragmatic breathing      Lumbar Exercises: Stretches   Single Knee to Chest Stretch  Right;Left;1 rep;30 seconds    Double Knee to Chest Stretch  1 rep;30 seconds    Lower Trunk Rotation  2 reps;30 seconds   supine   Prone on Elbows Stretch  1 rep;30 seconds    Quadruped Mid Back Stretch  1 rep;30 seconds   childs pose   Piriformis Stretch  Left;Right;1  rep;30 seconds   supine pushing on knee   Other Lumbar Stretch Exercise  quadruped cat cow      Manual Therapy   Manual Therapy  Soft tissue mobilization;Myofascial release    Soft tissue mobilization  circular massage to the abdomen to stimulate peristalic motion of the intestines, relase of the diaphragm    Myofascial Release  release of the bladder and rectal area             PT Education - 07/25/19 1056    Education Details  Access Code: [email or web address]; how to massage the anal sphincter to have a bowel movement and stroke upward on the penis to assist in fully emptying his bladder.    Person(s) Educated  Patient    Methods  Explanation;Demonstration;Handout;Verbal cues    Comprehension  Verbalized understanding;Returned demonstration       PT Short Term Goals - 07/17/19 1435      PT SHORT TERM GOAL #1   Title  independent with initial HEP    Time  4    Period  Weeks    Status   New    Target Date  08/14/19      PT SHORT TERM GOAL #2   Title  understand correct toileting technique to relax the pelvic floor and diaphragmatic breathing    Time  4    Period  Weeks    Status  New    Target Date  08/14/19      PT SHORT TERM GOAL #3   Title  ability to relax pelvic floor without spasms due to calming the nervous system down    Time  4    Period  Weeks    Status  New    Target Date  08/14/19      PT SHORT TERM GOAL #4   Title  decreased time spent on commode t o</= 30  minutes for a bowel movement    Time  4    Period  Weeks    Status  New    Target Date  08/14/19        PT Long Term Goals - 07/17/19 1437      PT LONG TERM GOAL #1   Title  independent with advanced HEP    Time  12    Period  Weeks    Status  New    Target Date  10/09/19      PT LONG TERM GOAL #2   Title  able to fully empty his bladder  due to relaxation of the pelvic floor and elongation of pelvic floor muscles    Time  12    Period  Weeks    Status  New    Target Date  10/09/19      PT LONG TERM GOAL #3   Title  able to fully empty his bowels with sitting on the commode </= 15 minutes due to relaxation of the pelvic floor    Time  12    Period  Weeks    Status  New    Target Date  10/09/19      PT LONG TERM GOAL #4   Title  when asked to bulge the pelvic floor there are not spasms    Time  12    Period  Weeks    Status  New    Target Date  10/09/19      PT LONG TERM GOAL #5   Title  able to perform core  strengthening without bulge the abdomen and putting pressure on the pelvic floor    Time  12    Period  Weeks    Status  New    Target Date  10/09/19            Plan - 07/25/19 1020    Clinical Impression Statement  Patient reports he is having better bowel movements using the toileting technique. Patient is concerned about the gas. Patient has tight lower abdomen and diaphgram. Patient has learned hip stretches and is able to return demonstration. Patient  will benefit from skilled therapy to improve pelvic floor relaxation and coordination while improving strength.    Personal Factors and Comorbidities  Comorbidity 1;Comorbidity 2;Comorbidity 3+;Sex    Comorbidities  rectal adenocarcinoma 07/06/2017; radiation with chemotherapy 07/19/2019-08/24/2017; chemotherapy 11/19/2017; Robotic assisted LAR 10/10/2017    Examination-Activity Limitations  Toileting    Examination-Participation Restrictions  Interpersonal Relationship    Stability/Clinical Decision Making  Evolving/Moderate complexity    Rehab Potential  Excellent    PT Frequency  1x / week    PT Duration  12 weeks    PT Treatment/Interventions  Biofeedback;Therapeutic exercise;Therapeutic activities;Neuromuscular re-education;Functional mobility training;Patient/family education;Dry needling;Manual techniques;Taping;Spinal Manipulations;Joint Manipulations    PT Next Visit Plan  abdominal massage, bowel health, hip mobilization, bulging of the pelvic floor in squatting    PT Home Exercise Plan  Access Code: [email or web address]    Recommended Other Services  MD signed initial eval    Consulted and Agree with Plan of Care  Patient       Patient will benefit from skilled therapeutic intervention in order to improve the following deficits and impairments:  Decreased range of motion, Increased fascial restricitons, Increased muscle spasms, Decreased activity tolerance, Pain, Decreased scar mobility, Decreased strength, Decreased mobility  Visit Diagnosis: Muscle weakness (generalized)  Unspecified lack of coordination  Rectal adenocarcinoma [facility])     Problem List Patient Active Problem List   Diagnosis Date Noted  . Iron deficiency anemia due to chronic blood loss 09/03/2017  . Genetic testing 08/30/2017  . Family history of prostate cancer   . 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 Essential hypertension 07/03/2017  . Elevated blood pressure 08/14/2013  . GERD  (gastroesophageal reflux disease) 09/10/2012    Earlie Counts, PT 07/25/19 11:01 AM   [facility] [address] [place], [place], [address] Phone: [phone]   Fax:  [phone]  Name: Paul [name] MRN: [id number] Date of Birth: 1966-08-08

## 2019-08-01 ENCOUNTER — Ambulatory Visit: Payer: BC Managed Care – PPO | Admitting: Physical Therapy

## 2019-08-01 ENCOUNTER — Encounter: Payer: Self-pay | Admitting: Physical Therapy

## 2019-08-01 ENCOUNTER — Other Ambulatory Visit: Payer: Self-pay

## 2019-08-01 DIAGNOSIS — M6281 Muscle weakness (generalized): Secondary | ICD-10-CM

## 2019-08-01 DIAGNOSIS — C20 Malignant neoplasm of rectum: Secondary | ICD-10-CM

## 2019-08-01 DIAGNOSIS — R279 Unspecified lack of coordination: Secondary | ICD-10-CM | POA: Diagnosis not present

## 2019-08-01 NOTE — Therapy (Signed)
[facility] [address] [place], [place], [address] Phone: [phone]   Fax:  [phone]  Physical Therapy Treatment  Patient Details  Name: Paul [name] MRN: [id number] Date of Birth: 30-Sep-1966 Referring Provider (PT): Dr. Truitt Merle   Encounter Date: 08/01/2019  PT End of Session - 08/01/19 1059    Visit Number  3    Date for PT Re-Evaluation  10/09/19    Authorization Type  BCBS    Authorization - Visit Number  3    Authorization - Number of Visits  90    PT Start Time  [id number]    PT Stop Time  1100    PT Time Calculation (min)  45 min    Activity Tolerance  Patient tolerated treatment well;No increased pain    Behavior During Therapy  WFL for tasks assessed/performed       Past Medical History:  Diagnosis Date  . Allergy    grass, dander  . Essential hypertension 07/03/2017  . Family history of prostate cancer   . GERD (gastroesophageal reflux disease)   . History of meningitis   . Hypertension   . 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 COLONOSCOPY WITH PROPOFOL Left 07/05/2017   Procedure: COLONOSCOPY WITH PROPOFOL;  Surgeon: Arta Silence, MD;  Location: [facility];  Service: Endoscopy;  Laterality: Left;  . PROCTOSCOPY N/A 10/10/2017   Procedure: RIGID PROCTOSCOPY;  Surgeon: Michael Boston, MD;  Location: [facility];  Service: General;  Laterality: N/A;  . XI ROBOTIC ASSISTED LOWER ANTERIOR RESECTION N/A 10/10/2017   Procedure: XI ROBOTIC ASSISTED LOWER ANTERIOR RESECTION;  Surgeon: Michael Boston, MD;  Location: [facility];  Service: General;  Laterality: N/A;    There were no vitals filed for this visit.  Subjective Assessment - 08/01/19 1021    Subjective  I am doing the exercises. The gas and bowel are the same. I have trouble emptying. I have a bowel  movement 2 times daily and sit on commode for 30 minutes.    Patient Stated Goals  Have normal BM    Currently in Pain?  Yes    Pain Score  2     Pain Location  Abdomen    Pain Orientation  Lower    Pain Descriptors / Indicators  Discomfort;Pressure    Pain Type  Chronic pain    Pain Onset  More than a month ago    Pain Frequency  Intermittent    Aggravating Factors   urinating    Pain Relieving Factors  not urinating    Multiple Pain Sites  No                    Pelvic Floor Special Questions - 08/01/19 0001    Pelvic Floor Internal Exam  to assess pelvic floor and treatment    Exam Type  Rectal    Biofeedback  sitting rest 1.5 uv, hold 5 sec 6 uv with breath and not contracting the gluteals    Biofeedback sensor type  Surface   rectal       [facility] Adult PT Treatment/Exercise - 08/01/19 0001      Lumbar Exercises: Aerobic   Nustep  level 4 seat #11, arms #12; 5 min while assessing patient      Manual Therapy   Manual Therapy  Internal Pelvic Floor  Internal Pelvic Floor  puborectalis, EAS, IAS, obturator internist             PT Education - 08/01/19 1058    Education Details  Access Code: [email or web address]    Person(s) Educated  Patient    Methods  Explanation;Demonstration;Verbal cues;Handout    Comprehension  Verbalized understanding;Returned demonstration       PT Short Term Goals - 08/01/19 1105      PT SHORT TERM GOAL #1   Title  independent with initial HEP    Time  4    Period  Weeks    Status  Achieved    Target Date  08/14/19      PT SHORT TERM GOAL #2   Title  understand correct toileting technique to relax the pelvic floor and diaphragmatic breathing    Time  4    Status  On-going    Target Date  08/14/19      PT SHORT TERM GOAL #3   Title  ability to relax pelvic floor without spasms due to calming the nervous system down    Time  4    Period  Weeks    Status  Achieved    Target Date  08/14/19      PT SHORT TERM GOAL #4    Title  decreased time spent on commode t o</= 30  minutes for a bowel movement    Time  4    Period  Weeks    Status  Achieved    Target Date  08/14/19        PT Long Term Goals - 07/17/19 1437      PT LONG TERM GOAL #1   Title  independent with advanced HEP    Time  12    Period  Weeks    Status  New    Target Date  10/09/19      PT LONG TERM GOAL #2   Title  able to fully empty his bladder  due to relaxation of the pelvic floor and elongation of pelvic floor muscles    Time  12    Period  Weeks    Status  New    Target Date  10/09/19      PT LONG TERM GOAL #3   Title  able to fully empty his bowels with sitting on the commode </= 15 minutes due to relaxation of the pelvic floor    Time  12    Period  Weeks    Status  New    Target Date  10/09/19      PT LONG TERM GOAL #4   Title  when asked to bulge the pelvic floor there are not spasms    Time  12    Period  Weeks    Status  New    Target Date  10/09/19      PT LONG TERM GOAL #5   Title  able to perform core strengthening without bulge the abdomen and putting pressure on the pelvic floor    Time  12    Period  Weeks    Status  New    Target Date  10/09/19            Plan - 08/01/19 1024    Clinical Impression Statement  Patient does not notice a big difference with gas and bowel movement. Patient has tightness in the puborectalis, obturator internist, EAS and IAS. Patient muscles responded well to the manual work. Patient  resting tone in sitting is 1.5 uv. He is able to contract to 6 uv for 5 seconds with VC to breath and not contract the gluteals. Patient reports his stool are skinny and short. He is going to the bathroom 2 times a day but has to stay on the commode for 30 minutes. Patient will benefit from skilled therapy to improve pelvic floor relaxation and coordination while improving strength.    Personal Factors and Comorbidities  Comorbidity 1;Comorbidity 2;Comorbidity 3+;Sex    Comorbidities   rectal adenocarcinoma 07/06/2017; radiation with chemotherapy 07/19/2019-08/24/2017; chemotherapy 11/19/2017; Robotic assisted LAR 10/10/2017    Examination-Activity Limitations  Toileting    Examination-Participation Restrictions  Interpersonal Relationship    Stability/Clinical Decision Making  Evolving/Moderate complexity    Rehab Potential  Excellent    PT Frequency  1x / week    PT Duration  12 weeks    PT Treatment/Interventions  Biofeedback;Therapeutic exercise;Therapeutic activities;Neuromuscular re-education;Functional mobility training;Patient/family education;Dry needling;Manual techniques;Taping;Spinal Manipulations;Joint Manipulations    PT Next Visit Plan  abdominal massage, bowel health, hip mobilization, bulging of the pelvic floor in squatting; pelvic floor EMG for control    PT Home Exercise Plan  Access Code: [email or web address]    Consulted and Agree with Plan of Care  Patient       Patient will benefit from skilled therapeutic intervention in order to improve the following deficits and impairments:  Decreased range of motion, Increased fascial restricitons, Increased muscle spasms, Decreased activity tolerance, Pain, Decreased scar mobility, Decreased strength, Decreased mobility  Visit Diagnosis: Muscle weakness (generalized)  Unspecified lack of coordination  Rectal adenocarcinoma [facility])     Problem List Patient Active Problem List   Diagnosis Date Noted  . Iron deficiency anemia due to chronic blood loss 09/03/2017  . Genetic testing 08/30/2017  . Family history of prostate cancer   . 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 Essential hypertension 07/03/2017  . Elevated blood pressure 08/14/2013  . GERD (gastroesophageal reflux disease) 09/10/2012    Earlie Counts, PT 08/01/19 11:07 AM   [facility] [address] [place], [place], [address] Phone: [phone]   Fax:   [phone]  Name: Paul [name] MRN: [id number] Date of Birth: [birth year]

## 2019-08-01 NOTE — Patient Instructions (Signed)
Access Code: [email or web address]  URL: [email or web address]  Date: 08/01/2019  Prepared by: Earlie Counts   Exercises Supine Single Knee to Chest Stretch - 1 reps - 1 sets - 30 sec hold                            - 1x daily - 7x weekly Supine Double Knee to Chest - 1 reps - 1 sets - 30 sec hold - 1x daily - 7x weekly Supine Lower Trunk Rotation - 1 reps - 1 sets - 30 sec hold - 1x daily - 7x weekly Supine Figure 4 Piriformis Stretch - 2 reps - 1 sets - 30 sec hold - 1x daily - 7x weekly Cat-Camel - 10 reps - 1 sets - 1x daily - 7x weekly Child's Pose Stretch - 2 reps - 1 sets - 30 sec hold - 1x daily - 7x weekly Prone Press Up on Elbows - 1 reps - 1 sets - 30 sec hold - 1x daily                            - 7x weekly Supine Abdominal Wall Massage - 1 reps - 1 sets - 2-3 min hold - 1x daily - 7x weekly Seated Pelvic Floor Contraction - 5 reps - 1 sets - 5sec hold - 3x daily - 7x weekly [facility] [address] [place], [place] [address] Phone # [phone] Fax [phone]

## 2019-08-08 ENCOUNTER — Ambulatory Visit: Payer: BC Managed Care – PPO | Admitting: Physical Therapy

## 2019-08-08 ENCOUNTER — Encounter: Payer: Self-pay | Admitting: Physical Therapy

## 2019-08-08 ENCOUNTER — Other Ambulatory Visit: Payer: Self-pay

## 2019-08-08 DIAGNOSIS — R279 Unspecified lack of coordination: Secondary | ICD-10-CM | POA: Diagnosis not present

## 2019-08-08 DIAGNOSIS — M6281 Muscle weakness (generalized): Secondary | ICD-10-CM

## 2019-08-08 DIAGNOSIS — C20 Malignant neoplasm of rectum: Secondary | ICD-10-CM | POA: Diagnosis not present

## 2019-08-08 NOTE — Therapy (Signed)
[facility] [address] [place], [place], [address] Phone: [phone]   Fax:  [phone]  Physical Therapy Treatment  Patient Details  Name: Paul [name] MRN: [id number] Date of Birth: May 31, 1966 Referring Provider (PT): Dr. Truitt Merle   Encounter Date: 08/08/2019  PT End of Session - 08/08/19 1058    Visit Number  4    Date for PT Re-Evaluation  10/09/19    Authorization Type  BCBS    Authorization - Visit Number  4    Authorization - Number of Visits  58    PT Start Time  [id number]    PT Stop Time  1055    PT Time Calculation (min)  40 min    Activity Tolerance  Patient tolerated treatment well;No increased pain    Behavior During Therapy  WFL for tasks assessed/performed       Past Medical History:  Diagnosis Date  . Allergy    grass, dander  . Essential hypertension 07/03/2017  . Family history of prostate cancer   . GERD (gastroesophageal reflux disease)   . History of meningitis   . Hypertension   . 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 COLONOSCOPY WITH PROPOFOL Left 07/05/2017   Procedure: COLONOSCOPY WITH PROPOFOL;  Surgeon: Arta Silence, MD;  Location: [facility];  Service: Endoscopy;  Laterality: Left;  . PROCTOSCOPY N/A 10/10/2017   Procedure: RIGID PROCTOSCOPY;  Surgeon: Michael Boston, MD;  [facility];  Service: General;  Laterality: N/A;  . XI ROBOTIC ASSISTED LOWER ANTERIOR RESECTION N/A 10/10/2017   Procedure: XI ROBOTIC ASSISTED LOWER ANTERIOR RESECTION;  Surgeon: Michael Boston, MD;  [facility];  Service: General;  Laterality: N/A;    There were no vitals filed for this visit.  Subjective Assessment - 08/08/19 1020    Subjective  I still have alot of gas.    Patient Stated Goals  Have normal BM    Currently in Pain?  Yes     Pain Score  2     Pain Location  Abdomen    Pain Orientation  Lower    Pain Descriptors / Indicators  Discomfort;Pressure    Pain Type  Chronic pain    Pain Onset  More than a month ago    Pain Frequency  Intermittent    Aggravating Factors   when needing to use the bathroom    Pain Relieving Factors  after using the commode    Multiple Pain Sites  No         [facility] PT Assessment - 08/08/19 0001      PROM   Right Hip External Rotation   45    Left Hip External Rotation   50                   [facility] Adult PT Treatment/Exercise - 08/08/19 0001      Self-Care   Self-Care  Other Self-Care Comments    Other Self-Care Comments   after bowel movement contract the pelvic floor 5 times to reduce the urge      Lumbar Exercises: Supine   Other Supine Lumbar Exercises  tried squat against wall in standing but hard on his knees and unable to relax; did in supine on mat and hold for 1 minute with belly breathing to relax the pelvic floor  Lumbar Exercises: Prone   Other Prone Lumbar Exercises  prone hip extension with knee bent 15x each      Manual Therapy   Manual Therapy  Joint mobilization    Joint Mobilization  bil. hips for anterior glide, distraction and inferior glide using the mulligan belt and contract relax with hip ER             PT Education - 08/08/19 1057    Education Details  Access Code: [email or web address]    Person(s) Educated  Patient    Methods  Explanation;Demonstration;Handout    Comprehension  Verbalized understanding;Returned demonstration       PT Short Term Goals - 08/08/19 1101      PT SHORT TERM GOAL #1   Title  independent with initial HEP    Time  4    Period  Weeks    Status  Achieved      PT SHORT TERM GOAL #2   Title  understand correct toileting technique to relax the pelvic floor and diaphragmatic breathing    Time  4    Period  Weeks    Status  Achieved      PT SHORT TERM GOAL #3   Title  ability to relax pelvic floor  without spasms due to calming the nervous system down    Time  4    Period  Weeks    Status  Achieved      PT SHORT TERM GOAL #4   Title  decreased time spent on commode t o</= 30  minutes for a bowel movement    Time  4    Period  Weeks    Status  Achieved        PT Long Term Goals - 07/17/19 1437      PT LONG TERM GOAL #1   Title  independent with advanced HEP    Time  12    Period  Weeks    Status  New    Target Date  10/09/19      PT LONG TERM GOAL #2   Title  able to fully empty his bladder  due to relaxation of the pelvic floor and elongation of pelvic floor muscles    Time  12    Period  Weeks    Status  New    Target Date  10/09/19      PT LONG TERM GOAL #3   Title  able to fully empty his bowels with sitting on the commode </= 15 minutes due to relaxation of the pelvic floor    Time  12    Period  Weeks    Status  New    Target Date  10/09/19      PT LONG TERM GOAL #4   Title  when asked to bulge the pelvic floor there are not spasms    Time  12    Period  Weeks    Status  New    Target Date  10/09/19      PT LONG TERM GOAL #5   Title  able to perform core strengthening without bulge the abdomen and putting pressure on the pelvic floor    Time  12    Period  Weeks    Status  New    Target Date  10/09/19            Plan - 08/08/19 1022    Clinical Impression Statement  Patient feels like he has to have  a bowel movement after he stands up from one so he will contract the pelvic floor to reduce the urge. Patient bilateral hip P/ROM has doubled. Patient is able to squat with feet on wall in supine and difficutly with standing to squat due to knees. Patient feels he is not passing as much gas. Patient will benefit from skilled therapy to improve pelvic floor relaxation and coordination while improve strength.    Personal Factors and Comorbidities  Comorbidity 1;Comorbidity 2;Comorbidity 3+;Sex    Comorbidities  rectal adenocarcinoma 07/06/2017;  radiation with chemotherapy 07/19/2019-08/24/2017; chemotherapy 11/19/2017; Robotic assisted LAR 10/10/2017    Examination-Activity Limitations  Toileting    Examination-Participation Restrictions  Interpersonal Relationship    Stability/Clinical Decision Making  Evolving/Moderate complexity    Rehab Potential  Excellent    PT Frequency  1x / week    PT Duration  12 weeks    PT Treatment/Interventions  Biofeedback;Therapeutic exercise;Therapeutic activities;Neuromuscular re-education;Functional mobility training;Patient/family education;Dry needling;Manual techniques;Taping;Spinal Manipulations;Joint Manipulations    PT Next Visit Plan  abdominal massage, bowel health, hip mobilization, pelvic floor EMG for control    PT Home Exercise Plan  Access Code: [email or web address]    Consulted and Agree with Plan of Care  Patient       Patient will benefit from skilled therapeutic intervention in order to improve the following deficits and impairments:  Decreased range of motion, Increased fascial restricitons, Increased muscle spasms, Decreased activity tolerance, Pain, Decreased scar mobility, Decreased strength, Decreased mobility  Visit Diagnosis: Muscle weakness (generalized)  Unspecified lack of coordination  Rectal adenocarcinoma [facility])     Problem List Patient Active Problem List   Diagnosis Date Noted  . Iron deficiency anemia due to chronic blood loss 09/03/2017  . Genetic testing 08/30/2017  . Family history of prostate cancer   . 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 Essential hypertension 07/03/2017  . Elevated blood pressure 08/14/2013  . GERD (gastroesophageal reflux disease) 09/10/2012    Earlie Counts, PT 08/08/19 11:02 AM   [facility] [address] [place], [place], [address] Phone: [phone]   Fax:  [phone]  Name: Maxemiliano Montney MRN: [id number] Date of Birth:  September 01, 1966

## 2019-08-08 NOTE — Patient Instructions (Signed)
Access Code: [email or web address]  URL: [email or web address]  Date: 08/08/2019  Prepared by: Earlie Counts   Exercises Supine Single Knee to Chest Stretch - 1 reps - 1 sets - 30 sec hold                            - 1x daily - 7x weekly Supine Double Knee to Chest - 1 reps - 1 sets - 30 sec hold - 1x daily - 7x weekly Supine Lower Trunk Rotation - 1 reps - 1 sets - 30 sec hold - 1x daily - 7x weekly Supine Figure 4 Piriformis Stretch - 2 reps - 1 sets - 30 sec hold - 1x daily - 7x weekly Cat-Camel - 10 reps - 1 sets - 1x daily - 7x weekly Child's Pose Stretch - 2 reps - 1 sets - 30 sec hold - 1x daily - 7x weekly Prone Press Up on Elbows - 1 reps - 1 sets - 30 sec hold - 1x daily                            - 7x weekly Supine Abdominal Wall Massage - 1 reps - 1 sets - 2-3 min hold - 1x daily - 7x weekly Seated Pelvic Floor Contraction - 5 reps - 1 sets - 5sec hold - 3x daily - 7x weekly Prone Hip Extension with Bent Knee - 10 reps - 2 sets - 1x daily - 7x weekly Supine Bridge - 15 reps - 1 sets - 5 sec hold - 1x daily - 7x weekly [facility] [address] [place], [place] [address] Phone # [phone] Fax [phone]

## 2019-08-22 ENCOUNTER — Encounter: Payer: BC Managed Care – PPO | Admitting: Physical Therapy

## 2019-08-29 ENCOUNTER — Other Ambulatory Visit: Payer: Self-pay

## 2019-08-29 ENCOUNTER — Encounter: Payer: Self-pay | Admitting: Physical Therapy

## 2019-08-29 ENCOUNTER — Ambulatory Visit: Payer: BC Managed Care – PPO | Attending: Hematology | Admitting: Physical Therapy

## 2019-08-29 DIAGNOSIS — C20 Malignant neoplasm of rectum: Secondary | ICD-10-CM | POA: Diagnosis not present

## 2019-08-29 DIAGNOSIS — M6281 Muscle weakness (generalized): Secondary | ICD-10-CM | POA: Diagnosis not present

## 2019-08-29 DIAGNOSIS — R279 Unspecified lack of coordination: Secondary | ICD-10-CM | POA: Insufficient documentation

## 2019-08-29 NOTE — Patient Instructions (Addendum)
Quick Contraction: Gravity Resisted (Sitting)    Sitting, quickly squeeze then fully relax pelvic floor. Perform __1_ sets of _5__. Rest for _15__ seconds between sets. Do _2__ times a day. You want to fully relax after the contraction Copyright  VHI. All rights reserved.  After a bowel movement contract 3 time quickly to help with urge  In the morning do the abdominal massage for 5 minutes and massage the belly button Drink your warm drink Eat breakfast   [facility] [address] [place], [place] [address] Phone # [phone] Fax [phone]'

## 2019-08-29 NOTE — Therapy (Addendum)
[facility] [address], [place], [place], [address] Phone: [phone]   Fax:  [phone]  Physical Therapy Treatment  Patient Details  Name: Paul [name] MRN: [id number] Date of Birth: 1966-02-04 Referring Provider (PT): Dr. Truitt Merle   Encounter Date: 08/29/2019  PT End of Session - 08/29/19 1111    Visit Number  5    Date for PT Re-Evaluation  10/09/19    Authorization Type  [employer]    Authorization - Visit Number  5    Authorization - Number of Visits  90    PT Start Time  1020    PT Stop Time  6168    PT Time Calculation (min)  45 min    Activity Tolerance  Patient tolerated treatment well;No increased pain    Behavior During Therapy  WFL for tasks assessed/performed       Past Medical History:  Diagnosis Date  . Allergy    grass, dander  . Essential hypertension 07/03/2017  . Family history of prostate cancer   . GERD (gastroesophageal reflux disease)   . History of meningitis   . Hypertension   . 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 COLONOSCOPY WITH PROPOFOL Left 07/05/2017   Procedure: COLONOSCOPY WITH PROPOFOL;  Surgeon: Arta Silence, MD;  Location: [place];  Service: Endoscopy;  Laterality: Left;  . PROCTOSCOPY N/A 10/10/2017   Procedure: RIGID PROCTOSCOPY;  Surgeon: Michael Boston, MD;  Location: [facility];  Service: General;  Laterality: N/A;  . XI ROBOTIC ASSISTED LOWER ANTERIOR RESECTION N/A 10/10/2017   Procedure: XI ROBOTIC ASSISTED LOWER ANTERIOR RESECTION;  Surgeon: Michael Boston, MD;  Location: [facility];  Service: General;  Laterality: N/A;    There were no vitals filed for this visit.  Subjective Assessment - 08/29/19 1023    Subjective  When he goes he is getting the stool to come out. I am not getting fully empty. sometimes I have to  go 2-3 times per day. MOst of the bowel movements are in the morning. I am having more days of fully emptying my bowels.    Patient Stated Goals  Have normal BM    Currently in Pain?  No/denies                    Pelvic Floor Special Questions - 08/29/19 0001    Biofeedback  sitting diaphragmatic breathing to relax the pelvic floor from 5 uv to 2 uv; sit on a rolled up towel with diaphragmatic breathing and bulging the pelvic floor; and decreased to 1.5 uv; then do a deep sound to push the stool out keeping level at 1.5 uv; contract quickly and fully relaxing ;     Biofeedback sensor type  Surface   rectal       [facility] Adult PT Treatment/Exercise - 08/29/19 0001      Self-Care   Self-Care  Other Self-Care Comments    Other Self-Care Comments   reviewed toileting technique with performing diaphragmatic breathing, performing a low sound to relax the pelvic floor, 3 quick contractions after to reduce the urge      Manual Therapy   Manual Therapy  Soft tissue mobilization;Myofascial release    Soft tissue mobilization  circular massage around the abdomen to promote peridatalic motion of the large intestines for bowel movements then had patient return demonstration  Myofascial Release  release the lower abdomen with the bladder from the sac of douglas and around the umbilicus then had patient demonstrate how to release the lower abdominal fascia             PT Education - 08/29/19 1102    Education Details  pelvic floor contraction and relax fully; abdominal massage and suprapubic massage; morning routine with drinking warm liquid; toileting technique    Person(s) Educated  Patient    Methods  Explanation;Demonstration;Handout    Comprehension  Returned demonstration;Verbalized understanding       PT Short Term Goals - 08/08/19 1101      PT SHORT TERM GOAL #1   Title  independent with initial HEP    Time  4    Period  Weeks    Status  Achieved      PT SHORT  TERM GOAL #2   Title  understand correct toileting technique to relax the pelvic floor and diaphragmatic breathing    Time  4    Period  Weeks    Status  Achieved      PT SHORT TERM GOAL #3   Title  ability to relax pelvic floor without spasms due to calming the nervous system down    Time  4    Period  Weeks    Status  Achieved      PT SHORT TERM GOAL #4   Title  decreased time spent on commode t o</= 30  minutes for a bowel movement    Time  4    Period  Weeks    Status  Achieved        PT Long Term Goals - 08/29/19 1119      PT LONG TERM GOAL #1   Title  independent with advanced HEP    Time  12    Period  Weeks    Status  On-going      PT LONG TERM GOAL #2   Title  able to fully empty his bladder  due to relaxation of the pelvic floor and elongation of pelvic floor muscles    Time  12    Period  Weeks    Status  On-going      PT LONG TERM GOAL #3   Title  able to fully empty his bowels with sitting on the commode </= 15 minutes due to relaxation of the pelvic floor    Time  12    Period  Weeks    Status  On-going      PT LONG TERM GOAL #4   Title  when asked to bulge the pelvic floor there are not spasms    Time  12    Period  Weeks    Status  On-going      PT LONG TERM GOAL #5   Title  able to perform core strengthening without bulge the abdomen and putting pressure on the pelvic floor    Time  12    Period  Weeks    Status  On-going            Plan - 08/29/19 1022    Clinical Impression Statement  Patient is have more good days of a full bowel movement. Sometimes he may have to go 2-3 times in the morning to completely empty his stools. Patient was able to do diaphragmatic breathing to reduce his resting level in sitting with the pelvic floor from 5 uv to 2 uv. After patient  did diaphragmatic breathing he was able to decrease resting tone to 1.5 uv. Patient was able to demonstrate correct toileting posture using a low grr sound to keep his resting  level at 1.5 uv. After a quick flick patient had difficulty with relaxing the pelvic floor. Patient was able to use the pelvic floor EMG to help him do a quick flick and fully relax. Patient will be drinking warm water in the morning to assist with bowel movements including diaphgramtic breathing, abdominal massage and eating habits. Patient will benefit from skilled therapy to improve pelvic floor relaxation and coordination while improving strength.    Personal Factors and Comorbidities  Comorbidity 1;Comorbidity 2;Comorbidity 3+;Sex    Comorbidities  rectal adenocarcinoma 07/06/2017; radiation with chemotherapy 07/19/2019-08/24/2017; chemotherapy 11/19/2017; Robotic assisted LAR 10/10/2017    Examination-Activity Limitations  Toileting    Examination-Participation Restrictions  Interpersonal Relationship    Stability/Clinical Decision Making  Evolving/Moderate complexity    Rehab Potential  Excellent    PT Frequency  1x / week    PT Duration  12 weeks    PT Treatment/Interventions  Biofeedback;Therapeutic exercise;Therapeutic activities;Neuromuscular re-education;Functional mobility training;Patient/family education;Dry needling;Manual techniques;Taping;Spinal Manipulations;Joint Manipulations    PT Next Visit Plan  abdominal massage; pelvic floor EMG for pelvic floor strength and full relaxation and add core strength;ask about emptying his bladder    PT Home Exercise Plan  Access Code: [email or web address]    Consulted and Agree with Plan of Care  Patient       Patient will benefit from skilled therapeutic intervention in order to improve the following deficits and impairments:  Decreased range of motion, Increased fascial restricitons, Increased muscle spasms, Decreased activity tolerance, Pain, Decreased scar mobility, Decreased strength, Decreased mobility  Visit Diagnosis: Muscle weakness (generalized)  Unspecified lack of coordination  Rectal adenocarcinoma [facility])     Problem List Patient Active  Problem List   Diagnosis Date Noted  . Iron deficiency anemia due to chronic blood loss 09/03/2017  . Genetic testing 08/30/2017  . Family history of prostate cancer   . 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 Essential hypertension 07/03/2017  . Elevated blood pressure 08/14/2013  . GERD (gastroesophageal reflux disease) 09/10/2012    Earlie Counts, PT 08/29/19 11:21 AM   [facility] [address] [place], [place], [address] Phone: [phone]   Fax:  [phone]  Name: Paul [name] MRN: [id number] Date of Birth: 20-Jul-1966  PHYSICAL THERAPY DISCHARGE SUMMARY  Visits from Start of Care: 5  Current functional level related to goals / functional outcomes: See above. Patient has not returned since his last visit on 08/29/2019.    Remaining deficits: See above.    Education / Equipment: HEP Plan:                                                    Patient goals were not met. Patient is being discharged due to not returning since the last visit.  Thank you for the referral. Earlie Counts, PT 10/14/19 9:24 AM  ?????

## 2019-09-12 ENCOUNTER — Encounter: Payer: BC Managed Care – PPO | Admitting: Physical Therapy

## 2019-10-03 ENCOUNTER — Other Ambulatory Visit: Payer: Self-pay

## 2019-10-03 ENCOUNTER — Ambulatory Visit (HOSPITAL_COMMUNITY)
Admission: RE | Admit: 2019-10-03 | Discharge: 2019-10-03 | Disposition: A | Discharge status: Home / Self Care | Payer: BC Managed Care – PPO | Source: Ambulatory Visit | Attending: Hematology | Admitting: Hematology

## 2019-10-03 DIAGNOSIS — C218 Malignant neoplasm of overlapping sites of rectum, anus and anal canal: Secondary | ICD-10-CM | POA: Diagnosis not present

## 2019-10-03 DIAGNOSIS — C20 Malignant neoplasm of rectum: Secondary | ICD-10-CM | POA: Insufficient documentation

## 2019-10-03 MED ORDER — IOHEXOL 300 MG/ML  SOLN
100.0000 mL | Freq: Once | INTRAMUSCULAR | Status: AC | PRN
  Administered 2019-10-03: 100 mL via INTRAVENOUS

## 2019-10-03 MED ORDER — SODIUM CHLORIDE (PF) 0.9 % IJ SOLN
INTRAMUSCULAR | Status: AC
  Filled 2019-10-03: qty 50

## 2019-10-07 ENCOUNTER — Encounter: Payer: Self-pay | Admitting: Hematology

## 2019-10-10 ENCOUNTER — Other Ambulatory Visit: Payer: Self-pay

## 2019-10-13 ENCOUNTER — Other Ambulatory Visit: Payer: Self-pay

## 2019-10-13 ENCOUNTER — Encounter: Payer: Self-pay | Admitting: Family Medicine

## 2019-10-13 ENCOUNTER — Ambulatory Visit (INDEPENDENT_AMBULATORY_CARE_PROVIDER_SITE_OTHER): Payer: BC Managed Care – PPO | Admitting: Family Medicine

## 2019-10-13 VITALS — BP 120/76 | HR 73 | Temp 97.8°F | Ht 71.5 in | Wt 196.7 lb

## 2019-10-13 DIAGNOSIS — Z0000 Encounter for general adult medical examination without abnormal findings: Secondary | ICD-10-CM | POA: Diagnosis not present

## 2019-10-13 MED ORDER — SILDENAFIL CITRATE 100 MG PO TABS: 100.0000 mg | ORAL_TABLET | Freq: Every day | ORAL | 11 refills | Status: DC | PRN

## 2019-10-13 NOTE — Progress Notes (Signed)
Subjective:     Patient ID: Paul [name], male   DOB: 09/22/1966, 53 y.o.   MRN: [id number]  HPI   [name] is seen for physical..  He has history of rectal adenocarcinoma 2018, hypertension, GERD.  He had colonoscopy a year ago and was told to get 3-year follow-up.  His immunizations are up-to-date with exception of shingles vaccine.  Generally doing well.  Recent follow-up CT chest and abdomen and pelvis per oncology which was stable.  He has follow-up with oncology on the 30th.  He has had slightly more frequent bowel movements and occasionally loose bowel movement since his rectal surgery but no bloody stools.  Appetite and weight are stable.  He had some erectile dysfunction.  He was told this may be related to some his radiation therapy.  He gets some erection but has difficulty maintaining.  Non-smoker.  Blood pressure well controlled with amlodipine  Past Medical History:  Diagnosis Date  . Allergy    grass, dander  . Essential hypertension 07/03/2017  . Family history of prostate cancer   . GERD (gastroesophageal reflux disease)   . History of meningitis   . Hypertension   . 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 COLONOSCOPY WITH PROPOFOL Left 07/05/2017   Procedure: COLONOSCOPY WITH PROPOFOL;  Surgeon: Arta Silence, MD;  Location: [place];  Service: Endoscopy;  Laterality: Left;  . PROCTOSCOPY N/A 10/10/2017   Procedure: RIGID PROCTOSCOPY;  Surgeon: Michael Boston, MD;  Location: [facility];  Service: General;  Laterality: N/A;  . XI ROBOTIC ASSISTED LOWER ANTERIOR RESECTION N/A 10/10/2017   Procedure: XI ROBOTIC ASSISTED LOWER ANTERIOR RESECTION;  Surgeon: Michael Boston, MD;  Location: [facility];  Service: General;  Laterality: N/A;    reports that he has never smoked. He has never used smokeless tobacco. He reports that he does  not drink alcohol or use drugs. family history includes Asthma in his daughter, father, son, and son; Cancer in his father; Dementia in his maternal grandmother; Hypertension in his mother. Allergies  Allergen Reactions  . Chloroquine Itching     Review of Systems  Constitutional: Negative for appetite change, chills, fatigue, fever and unexpected weight change.  Eyes: Negative for visual disturbance.  Respiratory: Negative for cough, chest tightness and shortness of breath.   Cardiovascular: Negative for chest pain, palpitations and leg swelling.  Gastrointestinal: Negative for abdominal pain and blood in stool.  Endocrine: Negative for polydipsia and polyuria.  Neurological: Negative for dizziness, syncope, weakness, light-headedness and headaches.       Objective:   Physical Exam Constitutional:      General: He is not in acute distress.    Appearance: He is well-developed.  HENT:     Head: Normocephalic and atraumatic.     Right Ear: External ear normal.     Left Ear: External ear normal.  Eyes:     Conjunctiva/sclera: Conjunctivae normal.     Pupils: Pupils are equal, round, and reactive to light.  Neck:     Thyroid: No thyromegaly.  Cardiovascular:     Rate and Rhythm: Normal rate and regular rhythm.     Heart sounds: Normal heart sounds. No murmur.  Pulmonary:     Effort: No respiratory distress.     Breath sounds: No wheezing or rales.  Abdominal:     General: Bowel sounds are normal. There is no distension.  Palpations: Abdomen is soft. There is no mass.     Tenderness: There is no abdominal tenderness. There is no guarding or rebound.  Musculoskeletal:     Cervical back: Normal range of motion and neck supple.  Lymphadenopathy:     Cervical: No cervical adenopathy.  Skin:    Findings: No rash.  Neurological:     Mental Status: He is alert and oriented to person, place, and time.     Cranial Nerves: No cranial nerve deficit.     Deep Tendon Reflexes:  Reflexes normal.        Assessment:     Physical exam.  He has history of rectal adenocarcinoma and is 2 years out from surgery  He complains of some erectile dysfunction.  He has hypertension which is well controlled    Plan:     -Place future order for PSA, TSH, lipids.  Our lab personnel were out today and he will see if he can get this through oncology  -We discussed Shingrix vaccine and he will consider after checking on insurance coverage  -Consider trial of Viagra 100 mg 1/2 tablet to 1 tablet daily as needed  Eulas Post MD [facility]

## 2019-10-13 NOTE — Patient Instructions (Signed)
Preventive Care 41-51 Years Old, Male Preventive care refers to lifestyle choices and visits with your health care provider that can promote health and wellness. This includes:  A yearly physical exam. This is also called an annual well check.  Regular dental and eye exams.  Immunizations.  Screening for certain conditions.  Healthy lifestyle choices, such as eating a healthy diet, getting regular exercise, not using drugs or products that contain nicotine and tobacco, and limiting alcohol use. What can I expect for my preventive care visit? Physical exam Your health care provider will check:  Height and weight. These may be used to calculate body mass index (BMI), which is a measurement that tells if you are at a healthy weight.  Heart rate and blood pressure.  Your skin for abnormal spots. Counseling Your health care provider may ask you questions about:  Alcohol, tobacco, and drug use.  Emotional well-being.  Home and relationship well-being.  Sexual activity.  Eating habits.  Work and work Statistician. What immunizations do I need?  Influenza (flu) vaccine  This is recommended every year. Tetanus, diphtheria, and pertussis (Tdap) vaccine  You may need a Td booster every 10 years. Varicella (chickenpox) vaccine  You may need this vaccine if you have not already been vaccinated. Zoster (shingles) vaccine  You may need this after age 64. Measles, mumps, and rubella (MMR) vaccine  You may need at least one dose of MMR if you were born in 1957 or later. You may also need a second dose. Pneumococcal conjugate (PCV13) vaccine  You may need this if you have certain conditions and were not previously vaccinated. Pneumococcal polysaccharide (PPSV23) vaccine  You may need one or two doses if you smoke cigarettes or if you have certain conditions. Meningococcal conjugate (MenACWY) vaccine  You may need this if you have certain conditions. Hepatitis A  vaccine  You may need this if you have certain conditions or if you travel or work in places where you may be exposed to hepatitis A. Hepatitis B vaccine  You may need this if you have certain conditions or if you travel or work in places where you may be exposed to hepatitis B. Haemophilus influenzae type b (Hib) vaccine  You may need this if you have certain risk factors. Human papillomavirus (HPV) vaccine  If recommended by your health care provider, you may need three doses over 6 months. You may receive vaccines as individual doses or as more than one vaccine together in one shot (combination vaccines). Talk with your health care provider about the risks and benefits of combination vaccines. What tests do I need? Blood tests  Lipid and cholesterol levels. These may be checked every 5 years, or more frequently if you are over 60 years old.  Hepatitis C test.  Hepatitis B test. Screening  Lung cancer screening. You may have this screening every year starting at age 43 if you have a 30-pack-year history of smoking and currently smoke or have quit within the past 15 years.  Prostate cancer screening. Recommendations will vary depending on your family history and other risks.  Colorectal cancer screening. All adults should have this screening starting at age 72 and continuing until age 2. Your health care provider may recommend screening at age 14 if you are at increased risk. You will have tests every 1-10 years, depending on your results and the type of screening test.  Diabetes screening. This is done by checking your blood sugar (glucose) after you have not eaten  for a while (fasting). You may have this done every 1-3 years.  Sexually transmitted disease (STD) testing. Follow these instructions at home: Eating and drinking  Eat a diet that includes fresh fruits and vegetables, whole grains, lean protein, and low-fat dairy products.  Take vitamin and mineral supplements as  recommended by your health care provider.  Do not drink alcohol if your health care provider tells you not to drink.  If you drink alcohol: ? Limit how much you have to 0-2 drinks a day. ? Be aware of how much alcohol is in your drink. In the [place] one drink equals one 12 oz bottle of beer (355 mL), one 5 oz glass of wine (148 mL), or one 1 oz glass of hard liquor (44 mL). Lifestyle  Take daily care of your teeth and gums.  Stay active. Exercise for at least 30 minutes on 5 or more days each week.  Do not use any products that contain nicotine or tobacco, such as cigarettes, e-cigarettes, and chewing tobacco. If you need help quitting, ask your health care provider.  If you are sexually active, practice safe sex. Use a condom or other form of protection to prevent STIs (sexually transmitted infections).  Talk with your health care provider about taking a low-dose aspirin every day starting at age 55. What's next?  Go to your health care provider once a year for a well check visit.  Ask your health care provider how often you should have your eyes and teeth checked.  Stay up to date on all vaccines. This information is not intended to replace advice given to you by your health care provider. Make sure you discuss any questions you have with your health care provider. Document Released: 11/05/2015 Document Revised: 10/03/2018 Document Reviewed: 10/03/2018 Elsevier Patient Education  2020 [place]  FODMAPs (fermentable oligosaccharides, disaccharides, monosaccharides, and polyols) are sugars that are hard for some people to digest. A low-FODMAP eating plan may help some people who have bowel (intestinal) diseases to manage their symptoms. This meal plan can be complicated to follow. Work with a diet and nutrition specialist (dietitian) to make a low-FODMAP eating plan that is right for you. A dietitian can make sure that you get enough nutrition from this  diet. What are tips for following this plan? Reading food labels  Check labels for hidden FODMAPs such as: ? High-fructose syrup. ? Honey. ? Agave. ? Natural fruit flavors. ? Onion or garlic powder.  Choose low-FODMAP foods that contain 3-4 grams of fiber per serving.  Check food labels for serving sizes. Eat only one serving at a time to make sure FODMAP levels stay low. Meal planning  Follow a low-FODMAP eating plan for up to 6 weeks, or as told by your health care provider or dietitian.  To follow the eating plan: 1. Eliminate high-FODMAP foods from your diet completely. 2. Gradually reintroduce high-FODMAP foods into your diet one at a time. Most people should wait a few days after introducing one high-FODMAP food before they introduce the next high-FODMAP food. Your dietitian can recommend how quickly you may reintroduce foods. 3. Keep a daily record of what you eat and drink, and make note of any symptoms that you have after eating. 4. Review your daily record with a dietitian regularly. Your dietitian can help you identify which foods you can eat and which foods you should avoid. General tips  Drink enough fluid each day to keep your urine pale yellow.  Avoid processed foods. These often have added sugar and may be high in FODMAPs.  Avoid most dairy products, whole grains, and sweeteners.  Work with a dietitian to make sure you get enough fiber in your diet. Recommended foods Grains  Gluten-free grains, such as rice, oats, buckwheat, quinoa, corn, polenta, and millet. Gluten-free pasta, bread, or cereal. Rice noodles. Corn tortillas. Vegetables  Eggplant, zucchini, cucumber, peppers, green beans, Brussels sprouts, bean sprouts, lettuce, arugula, kale, Swiss chard, spinach, collard greens, bok choy, summer squash, potato, and tomato. Limited amounts of corn, carrot, and sweet potato. Green parts of scallions. Fruits  Bananas, oranges, lemons, limes, blueberries,  raspberries, strawberries, grapes, cantaloupe, honeydew melon, kiwi, papaya, passion fruit, and pineapple. Limited amounts of dried cranberries, banana chips, and shredded coconut. Dairy  Lactose-free milk, yogurt, and kefir. Lactose-free cottage cheese and ice cream. Non-dairy milks, such as almond, coconut, hemp, and rice milk. Yogurts made of non-dairy milks. Limited amounts of goat cheese, brie, mozzarella, parmesan, swiss, and other hard cheeses. Meats and other protein foods  Unseasoned beef, pork, poultry, or fish. Eggs. Berniece Salines. Tofu (firm) and tempeh. Limited amounts of nuts and seeds, such as almonds, walnuts, [place] nuts, pecans, peanuts, pumpkin seeds, chia seeds, and sunflower seeds. Fats and oils  Butter-free spreads. Vegetable oils, such as olive, canola, and sunflower oil. Seasoning and other foods  Artificial sweeteners with names that do not end in "ol" such as aspartame, saccharine, and stevia. Maple syrup, white table sugar, raw sugar, brown sugar, and molasses. Fresh basil, coriander, parsley, rosemary, and thyme. Beverages  Water and mineral water. Sugar-sweetened soft drinks. Small amounts of orange juice or cranberry juice. Black and green tea. Most dry wines. Coffee. This may not be a complete list of low-FODMAP foods. Talk with your dietitian for more information. Foods to avoid Grains  Wheat, including kamut, durum, and semolina. Barley and bulgur. Couscous. Wheat-based cereals. Wheat noodles, bread, crackers, and pastries. Vegetables  Chicory root, artichoke, asparagus, cabbage, snow peas, sugar snap peas, mushrooms, and cauliflower. Onions, garlic, leeks, and the white part of scallions. Fruits  Fresh, dried, and juiced forms of apple, pear, watermelon, peach, plum, cherries, apricots, blackberries, boysenberries, figs, nectarines, and mango. Avocado. Dairy  Milk, yogurt, ice cream, and soft cheese. Cream and sour cream. Milk-based sauces. Custard. Meats and  other protein foods  Fried or fatty meat. Sausage. Cashews and pistachios. Soybeans, baked beans, black beans, chickpeas, kidney beans, fava beans, navy beans, lentils, and split peas. Seasoning and other foods  Any sugar-free gum or candy. Foods that contain artificial sweeteners such as sorbitol, mannitol, isomalt, or xylitol. Foods that contain honey, high-fructose corn syrup, or agave. Bouillon, vegetable stock, beef stock, and chicken stock. Garlic and onion powder. Condiments made with onion, such as hummus, chutney, pickles, relish, salad dressing, and salsa. Tomato paste. Beverages  Chicory-based drinks. Coffee substitutes. Chamomile tea. Fennel tea. Sweet or fortified wines such as port or sherry. Diet soft drinks made with isomalt, mannitol, maltitol, sorbitol, or xylitol. Apple, pear, and mango juice. Juices with high-fructose corn syrup. This may not be a complete list of high-FODMAP foods. Talk with your dietitian to discuss what dietary choices are best for you.  Summary  A low-FODMAP eating plan is a short-term diet that eliminates FODMAPs from your diet to help ease symptoms of certain bowel diseases.  The eating plan usually lasts up to 6 weeks. After that, high-FODMAP foods are restarted gradually, one at a time, so you can find out which may be  causing symptoms.  A low-FODMAP eating plan can be complicated. It is best to work with a dietitian who has experience with this type of plan. This information is not intended to replace advice given to you by your health care provider. Make sure you discuss any questions you have with your health care provider. Document Released: 06/05/2017 Document Revised: 09/21/2017 Document Reviewed: 06/05/2017 Elsevier Patient Education  [place].  Consider shingles vaccine (Shingrix) and check on insurance coverage if interested.

## 2019-10-20 NOTE — Progress Notes (Addendum)
[place]   [phone] [phone]   Clinic Follow up Note   Patient Care Team: Eulas Post, MD as PCP - General (Family Medicine) Arta Silence, MD as Consulting Physician (Gastroenterology) Michael Boston, MD as Consulting Physician (General Surgery) Kyung Rudd, MD as Consulting Physician (Radiation Oncology) Truitt Merle, MD as Consulting Physician (Oncology)  Date of Service:  10/22/2019   I connected with Paul [name] on 10/22/2019 by video enabled telemedicine visit and verified that I am speaking with the correct person using two identifiers.   I discussed the limitations, risks, security and privacy concerns of performing an evaluation and management service by telephone and the availability of in person appointments. I also discussed with the patient that there may be a patient responsible charge related to this service. The patient expressed understanding and agreed to proceed.   Other persons participating in the visit and their role in the encounter:  my scribe Amoya Bernnett    Patient's location: office  Provider's location:  Home  CHIEF COMPLAINT: F/u of rectal cancer  SUMMARY OF ONCOLOGIC HISTORY: Oncology History Overview Note  Cancer Staging Rectal adenocarcinoma [facility]) Staging form: Colon and Rectum, AJCC 8th Edition - Clinical stage from 07/06/2017: Stage IIIB (cT3, cN1, cM0) - Signed by [name], NP on 07/10/2017    Rectal adenocarcinoma s/p robotic LAR resection 10/10/2017  07/03/2017 Imaging   CT ABD PELVIS W CONTRAST IMPRESSION: Possible mass in the rectum compatible carcinoma. Endoscopy recommended for further evaluation. Otherwise negative.   07/03/2017 Tumor Marker   CEA 2.7   07/05/2017 Initial Biopsy   Rectum biopsy -invasive adenocarcinoma, poorly differentiated   07/05/2017 Procedure   Colonoscopy Per Dr. Paulita Fujita Findings: The perianal and digital rectal examinations were  normal.  Internal hemorrhoids were found during retroflexion. The hemorrhoids were moderate. No additional abnormalities were found on retroflexion.  A fungating, sessile and ulcerated partially obstructing large mass was found in the recto-sigmoid colon.The mass was partially circumferential (involving two-thirds of the lumen circumference). Oozing was present. Area was successfully injected with 2 mL [place] ink for tattooing. This was biopsied with a cold forceps for histology.   07/05/2017 Initial Diagnosis   Rectal adenocarcinoma ([place])   07/06/2017 Imaging   Staging MRI ABD/PELVIS revealed clinical stage T3N1M0, IIIb    07/18/2017 - 08/24/2017 Radiation Therapy   Radaition with Dr Lisbeth Renshaw     Radiation treatment dates:   07/18/2017 - 08/24/2017  Site/dose:   The rectum was treated to 45 Gy in 25 fractions of 1.8 Gy, followed by a 5.4 Gy boost in 3 fractions to yield a total dose of 50.4 Gy.  Narrative: The patient tolerated radiation treatment relatively well.   He had rectal bleeding with clots that was bright red after bowel movements. He reported loose stools that were painful. The skin to the radiation site was irritated, but no skin breakage was noted.    07/18/2017 Imaging   CT CHEST  IMPRESSION: Negative. No evidence of thoracic metastatic disease or other significant abnormality.    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  The Hereditary Gene Panel offered by [facility] includes sequencing and/or deletion duplication testing of the following 47 genes: APC, ATM, AXIN2, BARD1, BMPR1A, BRCA1, BRCA2, BRIP1, CDH1, CDK4, CDKN2A (p14ARF), CDKN2A (p16INK4a), CHEK2, CTNNA1, DICER1, EPCAM (Deletion/duplication testing  only), GREM1 (promoter region deletion/duplication testing only), KIT, MEN1, MLH1,  MSH2, MSH3, MSH6, MUTYH, NBN, NF1, NHTL1, PALB2, PDGFRA, PMS2, POLD1, POLE, PTEN, RAD50, RAD51C, RAD51D, SDHB, SDHC, SDHD, SMAD4, SMARCA4. STK11, TP53, TSC1, TSC2, and VHL.  The following genes were evaluated for sequence changes only: SDHA and HOXB13 c.251G>A variant only.  The report date is August 27, 2017.    10/10/2017 Surgery   XI ROBOTIC ASSISTED LOWER ANTERIOR RESECTION AND RIGID PROCTOSCOPY bu Dr. Johney Maine and Dr. Dema Severin  10/10/17    10/10/2017 Pathology Results       Diagnosis 10/10/17  1. Colon, segmental resection for tumor, sigmoid SMALL FOCUS OF COLONIC GLANDS WITH HIGH GRADE DYSPLASIA POST NEOADJUVANT CHEMORADIATION THERAPY BIOPSY SITE WITH CHRONIC INFLAMMATION NO RESIDULE INVASIVE CARCINOMA PRESENT TWENTY-TWO BENIGN LYMPH NODES (0/22) 2. Colon, resection margin (donut), final distal BENIGN COLONIC TISSUE   11/19/2017 - 03/04/2018 Adjuvant Chemotherapy   He started adjuvant 2000 mg Xeloda BID 2 weeks on 1 week off on 11/19/17 and completed 5 cycles on 03/04/18   04/08/2018 Imaging   CT AP W Contrast 04/08/18 IMPRESSION: 1. Postoperative and post treatment related changes in the low anatomic pelvis, as above. Today's study will serve as a new baseline for future follow-up examinations. At this time, there is no definitive evidence to strongly suggest local recurrence of disease or definite metastatic disease in the abdomen or pelvis. 2. Mild pancreatic ductal dilatation. This is of uncertain etiology and significance. No obstructing lesion identified in the pancreatic head. Correlation with nonemergent MRI of the abdomen with and without IV gadolinium with MRCP is suggested in the near future to evaluate for the etiology of this dilatation.    10/21/2018 Imaging   CT CAP W Contrast 10/21/18 IMPRESSION: Status post low anterior resection with suspected radiation changes in the presacral space. No evidence of recurrent or metastatic disease.   10/03/2019 Imaging    CT CAP W Contast  IMPRESSION: 1. No findings of recurrent malignancy. 2. Abnormal prominence of the dorsal pancreatic duct in the pancreatic body and head, measuring up to 6 mm in diameter, similar to prior, cause uncertain. 3. Wall thickening in the lower rectum below the level of the anastomotic staple line, probably therapy related. 4. Stable presacral soft tissue density, likely therapy related. 5. Mild degenerative disc disease at L4-5 and L5-S1.          CURRENT THERAPY:  Surveillance  INTERVAL HISTORY:  Paul [name] is here for a follow up rectal cancer. He presents to the clinic alone. He notes he is doing well. He notes he did try PT which helped some. He has not seen urologist but is still interested. He denies any new changes or issues. He notes his bowel movements will feel like it is not complete and will need to have another BM. He feels PT has helped this. He notes he sees his PCP yearly, last time was last week.    REVIEW OF SYSTEMS:   Constitutional: Denies fevers, chills or abnormal weight loss Eyes: Denies blurriness of vision Ears, nose, mouth, throat, and face: Denies mucositis or sore throat Respiratory: Denies cough, dyspnea or wheezes Cardiovascular: Denies palpitation, chest discomfort or lower extremity swelling Gastrointestinal:  Denies nausea, heartburn or change in bowel habits (+) Bowel urgency, improved.  Skin: Denies abnormal skin rashes Lymphatics: Denies new lymphadenopathy or easy bruising Neurological:Denies numbness, tingling or new weaknesses Behavioral/Psych: Mood is stable, no new changes  All other systems were reviewed with the patient and are negative.  MEDICAL HISTORY:  Past Medical History:  Diagnosis Date  . Allergy    grass, dander  . Essential hypertension 07/03/2017  . Family history of prostate cancer   . GERD (gastroesophageal reflux disease)   . History of meningitis   . Hypertension   . Rectal adenocarcinoma s/p  robotic LAR resection 10/10/2017 07/10/2017  . 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 COLONOSCOPY WITH PROPOFOL Left 07/05/2017   Procedure: COLONOSCOPY WITH PROPOFOL;  Surgeon: Arta Silence, MD;  Location: [facility];  Service: Endoscopy;  Laterality: Left;  . PROCTOSCOPY N/A 10/10/2017   Procedure: RIGID PROCTOSCOPY;  Surgeon: Michael Boston, MD;  Location: [facility];  Service: General;  Laterality: N/A;  . XI ROBOTIC ASSISTED LOWER ANTERIOR RESECTION N/A 10/10/2017   Procedure: XI ROBOTIC ASSISTED LOWER ANTERIOR RESECTION;  Surgeon: Michael Boston, MD;  Location: [facility];  Service: General;  Laterality: N/A;    I have reviewed the social history and family history with the patient and they are unchanged from previous note.  ALLERGIES:  is allergic to chloroquine.  MEDICATIONS:  Current Outpatient Medications  Medication Sig Dispense Refill  . amLODipine (NORVASC) 5 MG tablet TAKE 1 TABLET DAILY 90 tablet 3  . calcium carbonate (OS-CAL - DOSED IN MG OF ELEMENTAL CALCIUM) 1250 (500 Ca) MG tablet Take 1 tablet by mouth daily as needed (for bones).    . Calcium Citrate-Vitamin D (CALCIUM CITRATE+D3 PETITES PO) Take 1 tablet by mouth daily.    . cetirizine (ZYRTEC) 10 MG tablet Take 10 mg by mouth daily as needed for allergies.    Marland Kitchen GARLIC PO Take 1 capsule by mouth daily.     . Multiple Vitamin (MULTIVITAMIN WITH MINERALS) TABS tablet Take 1 tablet by mouth daily.    . naproxen (NAPROSYN) 500 MG tablet Take 1 tablet (500 mg total) by mouth every 12 (twelve) hours as needed for mild pain or moderate pain. 30 tablet 1  . omeprazole (PRILOSEC) 40 MG capsule Take 40 mg by mouth daily as needed (heart burn).     . sildenafil (VIAGRA) 100 MG tablet Take 1 tablet (100 mg total) by mouth daily as needed for erectile dysfunction. 10 tablet 11   No current facility-administered medications for this visit.     PHYSICAL EXAMINATION: ECOG PERFORMANCE STATUS: 0 - Asymptomatic  Vitals:   10/22/19 1114  BP: (!) 135/92  Pulse: 71  Resp: 17  Temp: 98.4 F (36.9 C)  SpO2: 100%   Filed Weights   10/22/19 1114  Weight: 198 lb 8 oz (90 kg)     Exam not performed today per virtual visit   LABORATORY DATA:  I have reviewed the data as listed CBC Latest Ref Rng & Units 10/22/2019 07/03/2019 10/22/2018  WBC 4.0 - 10.5 K/uL 2.7(L) 3.5(L) 2.7(L)  Hemoglobin 13.0 - 17.0 g/dL 14.2 14.0 14.3  Hematocrit 39.0 - 52.0 % 42.4 41.0 42.3  Platelets 150 - 400 K/uL 180 175 182     CMP Latest Ref Rng & Units 10/22/2019 07/03/2019 10/22/2018  Glucose 70 - 99 mg/dL 109(H) 93 102(H)  BUN 6 - 20 mg/dL _0 Creatinine 0.61 - 1.24 mg/dL 0.87 0.93 0.83  Sodium 135 - 145 mmol/L 141 140 143  Potassium 3.5 - 5.1 mmol/L 4.0 4.4 4.2  Chloride 98 - 111 mmol/L 104 103 106  CO2 22 - 32 mmol/L _1 Calcium 8.9 - 10.3 mg/dL  9.0 9.5 9.6  Total Protein 6.5 - 8.1 g/dL 7.8 7.3 7.4  Total Bilirubin 0.3 - 1.2 mg/dL 0.5 0.6 0.6  Alkaline Phos 38 - 126 U/L 68 65 75  AST 15 - 41 U/L _0 ALT 0 - 44 U/L 25 25 32      RADIOGRAPHIC STUDIES: I have personally reviewed the radiological images as listed and agreed with the findings in the report. No results found.   ASSESSMENT & PLAN:  Paul [name] is a 53 y.o. male with   1. Poorly differentiated invasive rectal adenocarcinoma, cT3N1M0, stage IIIb, ypT0N0 -He was diagnosed in 06/2017. He is s/p concurrent chemoradiation with Xeloda, Surgical resection and 5 cycles of adjuvant Xeloda.  -He has recovered well fromsurgery and chemotreatment  -He completed repeat colonoscopy in 10/11/2018 through [facility]. Will repeat in 3 years. -I personally reviewed and discussed his CT CAP images from 10/03/19 which showed no evidence of cancer recurrence. His slight prominence of pancreatic duct is stable and likely benign.  -He is overall clinically doing well and  stable. His irregular BMs have improved with PT, will continue. Labs reviewed, CBC and CMP WNL except mild Leukopenia and neutropenia related to his prior chemo. CEA, PSA, TSH still pending.  -He is more than 2 years since diagnosis and his risk for recurrence has decreased. I plan to repeat last routine scan in late 2021. Plan to continue surveillance for a total of 5 years.  -F/u every 4 months until next scan then every 6 months.   2. Anemia; iron deficiency  -He previously had rectal bleeding that has not recurred recently. -He was previously on oral iron. I encouraged him to restart given his mild fatigue. I encouraged him to watch for constipation, he understands and is willing to take oral iron again.   3. Genetics Negative for pathogenetic mutations    4.Constipation/loose stool, gas, sexual dysfunction, secondary to his surgery and radiation.  -He still has irregular bowel movements with intermittent diarrhea/constipation along with gas, but so far manageable.Will continue Prunes which help better than Miralax.  -I previously recommended OTC Gas-X and he work on his diet to help discomfort.  -He has attended PT which has helped. I encouraged him to continue Pelvic floor exercises at home. I will check on his Urology referral for him to be seen.   5. HTN  -he is currently taking 5 mg amlodipinedaily.  -Mostly controlled   PLAN: -CT scan reviewed, NED. He is clinically doing well.  -Lab and F/u in 4 months  -Will check on referral to Urology.    No problem-specific Assessment & Plan notes found for this encounter.   Orders Placed This Encounter  Procedures  . Ambulatory referral to Urology    Referral Priority:   Routine    Referral Type:   Consultation    Referral Reason:   Specialty Services Required    Requested Specialty:   Urology    Number of Visits Requested:   1   All questions were answered. The patient knows to call the clinic with any problems,  questions or concerns. No barriers to learning was detected. I spent 20 minutes counseling the patient face to face. The total time spent in the appointment was 25 minutes and more than 50% was on counseling and review of test results     Truitt Merle, MD 10/22/2019   I, Joslyn Devon, am acting as scribe for Truitt Merle, MD.   I have reviewed  the above documentation for accuracy and completeness, and I agree with the above.

## 2019-10-22 ENCOUNTER — Telehealth: Payer: Self-pay | Admitting: Hematology

## 2019-10-22 ENCOUNTER — Inpatient Hospital Stay: Payer: BC Managed Care – PPO | Attending: Hematology

## 2019-10-22 ENCOUNTER — Inpatient Hospital Stay (HOSPITAL_BASED_OUTPATIENT_CLINIC_OR_DEPARTMENT_OTHER): Payer: BC Managed Care – PPO | Admitting: Hematology

## 2019-10-22 ENCOUNTER — Other Ambulatory Visit: Payer: Self-pay

## 2019-10-22 ENCOUNTER — Telehealth: Payer: Self-pay

## 2019-10-22 ENCOUNTER — Encounter: Payer: Self-pay | Admitting: Hematology

## 2019-10-22 VITALS — BP 135/92 | HR 71 | Temp 98.4°F | Resp 17 | Ht 71.5 in | Wt 198.5 lb

## 2019-10-22 DIAGNOSIS — Z0000 Encounter for general adult medical examination without abnormal findings: Secondary | ICD-10-CM | POA: Diagnosis not present

## 2019-10-22 DIAGNOSIS — C20 Malignant neoplasm of rectum: Secondary | ICD-10-CM

## 2019-10-22 DIAGNOSIS — I10 Essential (primary) hypertension: Secondary | ICD-10-CM | POA: Diagnosis not present

## 2019-10-22 LAB — COMPREHENSIVE METABOLIC PANEL
ALT: 25 U/L (ref 0–44)
AST: 26 U/L (ref 15–41)
Albumin: 4.6 g/dL (ref 3.5–5.0)
Alkaline Phosphatase: 68 U/L (ref 38–126)
Anion gap: 9 (ref 5–15)
BUN: 9 mg/dL (ref 6–20)
CO2: 28 mmol/L (ref 22–32)
Calcium: 9 mg/dL (ref 8.9–10.3)
Chloride: 104 mmol/L (ref 98–111)
Creatinine, Ser: 0.87 mg/dL (ref 0.61–1.24)
GFR calc Af Amer: >60 mL/min (ref >60)
GFR calc non Af Amer: >60 mL/min (ref >60)
Glucose, Bld: 109 mg/dL — ABNORMAL HIGH (ref 70–99)
Potassium: 4 mmol/L (ref 3.5–5.1)
Sodium: 141 mmol/L (ref 135–145)
Total Bilirubin: 0.5 mg/dL (ref 0.3–1.2)
Total Protein: 7.8 g/dL (ref 6.5–8.1)

## 2019-10-22 LAB — CBC WITH DIFFERENTIAL/PLATELET
Abs Immature Granulocytes: 0.01 10*3/uL (ref 0.00–0.07)
Basophils Absolute: 0 10*3/uL (ref 0.0–0.1)
Basophils Relative: 1 %
Eosinophils Absolute: 0.1 10*3/uL (ref 0.0–0.5)
Eosinophils Relative: 4 %
HCT: 42.4 % (ref 39.0–52.0)
Hemoglobin: 14.2 g/dL (ref 13.0–17.0)
Immature Granulocytes: 0 %
Lymphocytes Relative: 26 %
Lymphs Abs: 0.7 10*3/uL (ref 0.7–4.0)
MCH: 30 pg (ref 26.0–34.0)
MCHC: 33.5 g/dL (ref 30.0–36.0)
MCV: 89.5 fL (ref 80.0–100.0)
Monocytes Absolute: 0.3 10*3/uL (ref 0.1–1.0)
Monocytes Relative: 11 %
Neutro Abs: 1.6 10*3/uL — ABNORMAL LOW (ref 1.7–7.7)
Neutrophils Relative %: 58 %
Platelets: 180 10*3/uL (ref 150–400)
RBC: 4.74 MIL/uL (ref 4.22–5.81)
RDW: 12.3 % (ref 11.5–15.5)
WBC: 2.7 10*3/uL — ABNORMAL LOW (ref 4.0–10.5)
nRBC: 0 % (ref 0.0–0.2)

## 2019-10-22 LAB — CEA (IN HOUSE-CHCC): CEA (CHCC-In House): 3.07 ng/mL (ref 0.00–5.00)

## 2019-10-22 NOTE — Telephone Encounter (Signed)
Scheduled appt per 12/30 los.  Sent a message to HIM pool to get a calendar mailed out. 

## 2019-10-22 NOTE — Telephone Encounter (Signed)
Faxed referral to [facility] at [phone] included reason, demographics, OV note from today and copy of his insurance card.

## 2019-10-24 ENCOUNTER — Encounter: Payer: Self-pay | Admitting: Hematology

## 2019-11-04 ENCOUNTER — Encounter: Payer: Self-pay | Admitting: Hematology

## 2019-11-07 ENCOUNTER — Telehealth: Payer: Self-pay

## 2019-11-07 NOTE — Telephone Encounter (Signed)
Received approval for prior authorization of Sildenafil citrate 100 mg tablet Effective dates: 10/05/2019--11/05/2020 Case ID [id number]

## 2019-12-02 DIAGNOSIS — N5201 Erectile dysfunction due to arterial insufficiency: Secondary | ICD-10-CM | POA: Diagnosis not present

## 2020-02-18 NOTE — Progress Notes (Signed)
[place]   [phone] [phone]   Clinic Follow up Note   Patient Care Team: Eulas Post, MD as PCP - General (Family Medicine) Arta Silence, MD as Consulting Physician (Gastroenterology) Michael Boston, MD as Consulting Physician (General Surgery) Kyung Rudd, MD as Consulting Physician (Radiation Oncology) Truitt Merle, MD as Consulting Physician (Oncology)  Date of Service:  02/19/2020  CHIEF COMPLAINT:  F/u of rectal cancer  SUMMARY OF ONCOLOGIC HISTORY: Oncology History Overview Note  Cancer Staging Rectal adenocarcinoma [facility]) Staging form: Colon and Rectum, AJCC 8th Edition - Clinical stage from 07/06/2017: Stage IIIB (cT3, cN1, cM0) - Signed by [name], NP on 07/10/2017    Rectal adenocarcinoma s/p robotic LAR resection 10/10/2017  07/03/2017 Imaging   CT ABD PELVIS W CONTRAST IMPRESSION: Possible mass in the rectum compatible carcinoma. Endoscopy recommended for further evaluation. Otherwise negative.   07/03/2017 Tumor Marker   CEA 2.7   07/05/2017 Initial Biopsy   Rectum biopsy -invasive adenocarcinoma, poorly differentiated   07/05/2017 Procedure   Colonoscopy Per Dr. Paulita Fujita Findings: The perianal and digital rectal examinations were normal.  Internal hemorrhoids were found during retroflexion. The hemorrhoids were moderate. No additional abnormalities were found on retroflexion.  A fungating, sessile and ulcerated partially obstructing large mass was found in the recto-sigmoid colon.The mass was partially circumferential (involving two-thirds of the lumen circumference). Oozing was present. Area was successfully injected with 2 mL [place] ink for tattooing. This was biopsied with a cold forceps for histology.   07/05/2017 Initial Diagnosis   Rectal adenocarcinoma ([place])   07/06/2017 Imaging   Staging MRI ABD/PELVIS revealed clinical stage T3N1M0, IIIb    07/18/2017 - 08/24/2017 Radiation Therapy   Radaition  with Dr Lisbeth Renshaw     Radiation treatment dates:   07/18/2017 - 08/24/2017  Site/dose:   The rectum was treated to 45 Gy in 25 fractions of 1.8 Gy, followed by a 5.4 Gy boost in 3 fractions to yield a total dose of 50.4 Gy.  Narrative: The patient tolerated radiation treatment relatively well.   He had rectal bleeding with clots that was bright red after bowel movements. He reported loose stools that were painful. The skin to the radiation site was irritated, but no skin breakage was noted.    07/18/2017 Imaging   CT CHEST  IMPRESSION: Negative. No evidence of thoracic metastatic disease or other significant abnormality.    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  The Hereditary Gene Panel offered by [facility] includes sequencing and/or deletion duplication testing of the following 47 genes: APC, ATM, AXIN2, BARD1, BMPR1A, BRCA1, BRCA2, BRIP1, CDH1, CDK4, CDKN2A (p14ARF), CDKN2A (p16INK4a), CHEK2, CTNNA1, DICER1, EPCAM (Deletion/duplication testing only), GREM1 (promoter region deletion/duplication testing only), KIT, MEN1, MLH1, MSH2, MSH3, MSH6, MUTYH, NBN, NF1, NHTL1, PALB2, PDGFRA, PMS2, POLD1, POLE, PTEN, RAD50, RAD51C, RAD51D, SDHB, SDHC, SDHD, SMAD4, SMARCA4. STK11, TP53, TSC1, TSC2, and VHL.  The following genes were evaluated for sequence changes only: SDHA and HOXB13 c.251G>A variant only.  The report date is August 27, 2017.    10/10/2017 Surgery   XI ROBOTIC ASSISTED LOWER ANTERIOR RESECTION AND RIGID PROCTOSCOPY bu Dr. Johney Maine and Dr. Dema Severin  10/10/17    10/10/2017 Pathology Results       Diagnosis 10/10/17  1. Colon, segmental resection for tumor, sigmoid SMALL FOCUS  OF COLONIC GLANDS WITH HIGH GRADE DYSPLASIA POST NEOADJUVANT CHEMORADIATION THERAPY BIOPSY SITE WITH CHRONIC  INFLAMMATION NO RESIDULE INVASIVE CARCINOMA PRESENT TWENTY-TWO BENIGN LYMPH NODES (0/22) 2. Colon, resection margin (donut), final distal BENIGN COLONIC TISSUE   11/19/2017 - 03/04/2018 Adjuvant Chemotherapy   He started adjuvant 2000 mg Xeloda BID 2 weeks on 1 week off on 11/19/17 and completed 5 cycles on 03/04/18   04/08/2018 Imaging   CT AP W Contrast 04/08/18 IMPRESSION: 1. Postoperative and post treatment related changes in the low anatomic pelvis, as above. Today's study will serve as a new baseline for future follow-up examinations. At this time, there is no definitive evidence to strongly suggest local recurrence of disease or definite metastatic disease in the abdomen or pelvis. 2. Mild pancreatic ductal dilatation. This is of uncertain etiology and significance. No obstructing lesion identified in the pancreatic head. Correlation with nonemergent MRI of the abdomen with and without IV gadolinium with MRCP is suggested in the near future to evaluate for the etiology of this dilatation.    10/21/2018 Imaging   CT CAP W Contrast 10/21/18 IMPRESSION: Status post low anterior resection with suspected radiation changes in the presacral space. No evidence of recurrent or metastatic disease.   10/03/2019 Imaging   CT CAP W Contast  IMPRESSION: 1. No findings of recurrent malignancy. 2. Abnormal prominence of the dorsal pancreatic duct in the pancreatic body and head, measuring up to 6 mm in diameter, similar to prior, cause uncertain. 3. Wall thickening in the lower rectum below the level of the anastomotic staple line, probably therapy related. 4. Stable presacral soft tissue density, likely therapy related. 5. Mild degenerative disc disease at L4-5 and L5-S1.          CURRENT THERAPY:  Surveillance  INTERVAL HISTORY:  Paul [name] is here for a follow up rectal cancer. He presents to the clinic alone. He notes he is doing well. He notes his BM are more  constipation and having issues completely emptying bowels. He denies GI bleeding. He has BM everyday and denies pain with BM. He notes he was to repeat colonoscopy 3 years after previous one. He will only have bloating right before BM. He denies incontinence and only has mild stool leak. He has done pelvic floor PT which helped him some. He denies recent infection in the past year. He had his first COVID19 vaccine last week.  He notes he has not taken his amlodipine this morning. He does not have BP meter to check at home. He will check it at [facility]. He feels when he does not sleep well his BP will be elevated.    REVIEW OF SYSTEMS:   Constitutional: Denies fevers, chills or abnormal weight loss Eyes: Denies blurriness of vision Ears, nose, mouth, throat, and face: Denies mucositis or sore throat Respiratory: Denies cough, dyspnea or wheezes Cardiovascular: Denies palpitation, chest discomfort or lower extremity swelling Gastrointestinal:  Denies nausea, heartburn (+) Constipation without complete emptying bowel Skin: Denies abnormal skin rashes Lymphatics: Denies new lymphadenopathy or easy bruising Neurological:Denies numbness, tingling or new weaknesses Behavioral/Psych: Mood is stable, no new changes  All other systems were reviewed with the patient and are negative.  MEDICAL HISTORY:  Past Medical History:  Diagnosis Date  . Allergy    grass, dander  . Essential hypertension 07/03/2017  . Family history of prostate cancer   . GERD (gastroesophageal reflux disease)   . History of meningitis   . Hypertension   . Rectal adenocarcinoma s/p robotic  LAR resection 10/10/2017 07/10/2017  . 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 COLONOSCOPY WITH PROPOFOL Left 07/05/2017   Procedure: COLONOSCOPY WITH PROPOFOL;  Surgeon: Arta Silence, MD;  Location: [facility];  Service: Endoscopy;  Laterality:  Left;  . PROCTOSCOPY N/A 10/10/2017   Procedure: RIGID PROCTOSCOPY;  Surgeon: Michael Boston, MD;  Location: [facility];  Service: General;  Laterality: N/A;  . XI ROBOTIC ASSISTED LOWER ANTERIOR RESECTION N/A 10/10/2017   Procedure: XI ROBOTIC ASSISTED LOWER ANTERIOR RESECTION;  Surgeon: Michael Boston, MD;  Location: [facility];  Service: General;  Laterality: N/A;    I have reviewed the social history and family history with the patient and they are unchanged from previous note.  ALLERGIES:  is allergic to chloroquine.  MEDICATIONS:  Current Outpatient Medications  Medication Sig Dispense Refill  . amLODipine (NORVASC) 5 MG tablet TAKE 1 TABLET DAILY 90 tablet 3  . calcium carbonate (OS-CAL - DOSED IN MG OF ELEMENTAL CALCIUM) 1250 (500 Ca) MG tablet Take 1 tablet by mouth daily as needed (for bones).    . Calcium Citrate-Vitamin D (CALCIUM CITRATE+D3 PETITES PO) Take 1 tablet by mouth daily.    . cetirizine (ZYRTEC) 10 MG tablet Take 10 mg by mouth daily as needed for allergies.    Marland Kitchen GARLIC PO Take 1 capsule by mouth daily.     . Multiple Vitamin (MULTIVITAMIN WITH MINERALS) TABS tablet Take 1 tablet by mouth daily.    . naproxen (NAPROSYN) 500 MG tablet Take 1 tablet (500 mg total) by mouth every 12 (twelve) hours as needed for mild pain or moderate pain. 30 tablet 1  . omeprazole (PRILOSEC) 40 MG capsule Take 40 mg by mouth daily as needed (heart burn).     . sildenafil (VIAGRA) 100 MG tablet Take 1 tablet (100 mg total) by mouth daily as needed for erectile dysfunction. 10 tablet 11   No current facility-administered medications for this visit.    PHYSICAL EXAMINATION: ECOG PERFORMANCE STATUS: 0 - Asymptomatic  Vitals:   02/19/20 1107  BP: (!) 154/91  Pulse: 66  Resp: 20  Temp: 98.2 F (36.8 C)  SpO2: 100%   Filed Weights   02/19/20 1107  Weight: 197 lb (89.4 kg)    GENERAL:alert, no distress and comfortable SKIN: skin color, texture, turgor are normal, no rashes or  significant lesions EYES: normal, Conjunctiva are pink and non-injected, sclera clear NECK: supple, thyroid normal size, non-tender, without nodularity LYMPH:  no palpable lymphadenopathy in the cervical, axillary or inguinal LUNGS: clear to auscultation and percussion with normal breathing effort HEART: regular rate & rhythm and no murmurs and no lower extremity edema ABDOMEN:abdomen soft, non-tender and normal bowel sounds Musculoskeletal:no cyanosis of digits and no clubbing  NEURO: alert & oriented x 3 with fluent speech, no focal motor/sensory deficits RECTAL: No palpable mass. No blood on glove. Normal stool present. Benign exam    LABORATORY DATA:  I have reviewed the data as listed CBC Latest Ref Rng & Units 02/19/2020 10/22/2019 07/03/2019  WBC 4.0 - 10.5 K/uL 2.7(L) 2.7(L) 3.5(L)  Hemoglobin 13.0 - 17.0 g/dL 13.1 14.2 14.0  Hematocrit 39.0 - 52.0 % 38.0(L) 42.4 41.0  Platelets 150 - 400 K/uL 160 180 175     CMP Latest Ref Rng & Units 02/19/2020 10/22/2019 07/03/2019  Glucose 70 - 99 mg/dL 114(H) 109(H) 93  BUN 6 - 20 mg/dL _0 Creatinine 0.61 -  1.24 mg/dL 0.86 0.87 0.93  Sodium 135 - 145 mmol/L 140 141 140  Potassium 3.5 - 5.1 mmol/L 3.7 4.0 4.4  Chloride 98 - 111 mmol/L 106 104 103  CO2 22 - 32 mmol/L _0 Calcium 8.9 - 10.3 mg/dL 8.8(L) 9.0 9.5  Total Protein 6.5 - 8.1 g/dL 7.0 7.8 7.3  Total Bilirubin 0.3 - 1.2 mg/dL 0.5 0.5 0.6  Alkaline Phos 38 - 126 U/L 76 68 65  AST 15 - 41 U/L _1 ALT 0 - 44 U/L _2 RADIOGRAPHIC STUDIES: I have personally reviewed the radiological images as listed and agreed with the findings in the report. No results found.   ASSESSMENT & PLAN:  Paul [name] is a 54 y.o. male with    1. Poorly differentiated invasive rectal adenocarcinoma, cT3N1M0, stage IIIb, ypT0N0 -He was diagnosed in 06/2017. He is s/p concurrent chemoradiation with Xeloda, Surgical resection and 5 cycles of adjuvant Xeloda.  -He has  recovered well fromsurgery and chemotreatment  -He completed repeat colonoscopy in 10/11/2018 through [facility]. Will repeat in 3 years. -He is clinically doing moderately well with mild constipation without completely emptying bowel. He will have occasional stool leakage. He had pelvic PT before with some improvement. Physical exam unremarkable. Labs reviewed, CBC and CMP WNL except stable WBC 2.7, ANC 1.5. CEA is still pending. He denies any recent infections.  -He is 2.5 years since his cancer diagnosis and his risk of recurrence is decreasing. Continue 5 year surveillance. Next and last surveillance scan in 6 months.  -F/u in 6 months.  -He has received his first COVID19 vaccine last week.    2. Anemia; iron deficiency  -Anemia and GI bleeding resolved.   3. Genetics Negative for pathogenetic mutations    4.Constipation/loose stool, gas,sexual dysfunction, secondary to his surgeryand radiation. -His Bm lately are more constipated and does not feel his bowel are completely emptied. He is still able to have BM daily with no pain or bleeding. He only has mild leakage.  -He has tried Miralax up to 3 times a day and Metamucil without much help. I recommend Seneka-S or liquid magnesium citrate if he cannot have BM for a few days. I encouraged he eat enough fiber and drink enough water.   5. HTN  -he is currently taking 5 mg amlodipinedaily. -He does not have BP cuff at home and goes to [facility] check [facility].  -His BP at 154/91 today (02/19/20). He notes he has not taken his medication yet. I suggest he f/u with PCP to better control HTN.   PLAN: -F/u in 6 months with lab and CT CAP a few days before    No problem-specific Assessment & Plan notes found for this encounter.   Orders Placed This Encounter  Procedures  . CT Abdomen Pelvis W Contrast    Standing Status:   Future    Standing Expiration Date:   02/18/2021    Order Specific Question:   If indicated for the ordered  procedure, I authorize the administration of contrast media per Radiology protocol    Answer:   Yes    Order Specific Question:   Preferred imaging location?    Answer:   [facility]    Order Specific Question:   Is Oral Contrast requested for this exam?    Answer:   Yes, Per Radiology protocol    Order Specific Question:   Radiology Contrast Protocol -  do NOT remove file path    Answer:   \\[email or web address]  . CT Chest W Contrast    Standing Status:   Future    Standing Expiration Date:   02/18/2021    Order Specific Question:   If indicated for the ordered procedure, I authorize the administration of contrast media per Radiology protocol    Answer:   Yes    Order Specific Question:   Preferred imaging location?    Answer:   [facility]    Order Specific Question:   Radiology Contrast Protocol - do NOT remove file path    Answer:   \\[email or web address]   All questions were answered. The patient knows to call the clinic with any problems, questions or concerns. No barriers to learning was detected. The total time spent in the appointment was 30 minutes.     Truitt Merle, MD 02/19/2020   I, Joslyn Devon, am acting as scribe for Truitt Merle, MD.   I have reviewed the above documentation for accuracy and completeness, and I agree with the above.

## 2020-02-19 ENCOUNTER — Inpatient Hospital Stay: Payer: BC Managed Care – PPO | Attending: Hematology | Admitting: Hematology

## 2020-02-19 ENCOUNTER — Encounter: Payer: Self-pay | Admitting: Hematology

## 2020-02-19 ENCOUNTER — Telehealth: Payer: Self-pay | Admitting: Hematology

## 2020-02-19 ENCOUNTER — Inpatient Hospital Stay: Payer: BC Managed Care – PPO

## 2020-02-19 ENCOUNTER — Other Ambulatory Visit: Payer: Self-pay

## 2020-02-19 VITALS — BP 154/91 | HR 66 | Temp 98.2°F | Resp 20 | Ht 71.5 in | Wt 197.0 lb

## 2020-02-19 DIAGNOSIS — C20 Malignant neoplasm of rectum: Secondary | ICD-10-CM | POA: Diagnosis not present

## 2020-02-19 DIAGNOSIS — I10 Essential (primary) hypertension: Secondary | ICD-10-CM | POA: Diagnosis not present

## 2020-02-19 DIAGNOSIS — Z79899 Other long term (current) drug therapy: Secondary | ICD-10-CM | POA: Diagnosis not present

## 2020-02-19 DIAGNOSIS — K5900 Constipation, unspecified: Secondary | ICD-10-CM | POA: Insufficient documentation

## 2020-02-19 LAB — COMPREHENSIVE METABOLIC PANEL
ALT: 24 U/L (ref 0–44)
AST: 24 U/L (ref 15–41)
Albumin: 4.1 g/dL (ref 3.5–5.0)
Alkaline Phosphatase: 76 U/L (ref 38–126)
Anion gap: 7 (ref 5–15)
BUN: 10 mg/dL (ref 6–20)
CO2: 27 mmol/L (ref 22–32)
Calcium: 8.8 mg/dL — ABNORMAL LOW (ref 8.9–10.3)
Chloride: 106 mmol/L (ref 98–111)
Creatinine, Ser: 0.86 mg/dL (ref 0.61–1.24)
GFR calc Af Amer: >60 mL/min (ref >60)
GFR calc non Af Amer: >60 mL/min (ref >60)
Glucose, Bld: 114 mg/dL — ABNORMAL HIGH (ref 70–99)
Potassium: 3.7 mmol/L (ref 3.5–5.1)
Sodium: 140 mmol/L (ref 135–145)
Total Bilirubin: 0.5 mg/dL (ref 0.3–1.2)
Total Protein: 7 g/dL (ref 6.5–8.1)

## 2020-02-19 LAB — CBC WITH DIFFERENTIAL/PLATELET
Abs Immature Granulocytes: 0 10*3/uL (ref 0.00–0.07)
Basophils Absolute: 0 10*3/uL (ref 0.0–0.1)
Basophils Relative: 1 %
Eosinophils Absolute: 0.1 10*3/uL (ref 0.0–0.5)
Eosinophils Relative: 4 %
HCT: 38 % — ABNORMAL LOW (ref 39.0–52.0)
Hemoglobin: 13.1 g/dL (ref 13.0–17.0)
Immature Granulocytes: 0 %
Lymphocytes Relative: 29 %
Lymphs Abs: 0.8 10*3/uL (ref 0.7–4.0)
MCH: 29.7 pg (ref 26.0–34.0)
MCHC: 34.5 g/dL (ref 30.0–36.0)
MCV: 86.2 fL (ref 80.0–100.0)
Monocytes Absolute: 0.3 10*3/uL (ref 0.1–1.0)
Monocytes Relative: 12 %
Neutro Abs: 1.5 10*3/uL — ABNORMAL LOW (ref 1.7–7.7)
Neutrophils Relative %: 54 %
Platelets: 160 10*3/uL (ref 150–400)
RBC: 4.41 MIL/uL (ref 4.22–5.81)
RDW: 12 % (ref 11.5–15.5)
WBC: 2.7 10*3/uL — ABNORMAL LOW (ref 4.0–10.5)
nRBC: 0 % (ref 0.0–0.2)

## 2020-02-19 LAB — CEA (IN HOUSE-CHCC): CEA (CHCC-In House): 2.49 ng/mL (ref 0.00–5.00)

## 2020-02-19 NOTE — Telephone Encounter (Signed)
Scheduled appt per 4/29 los.  Pt declined calendar and avs. 

## 2020-02-22 ENCOUNTER — Encounter: Payer: Self-pay | Admitting: Hematology

## 2020-04-19 DIAGNOSIS — Z125 Encounter for screening for malignant neoplasm of prostate: Secondary | ICD-10-CM | POA: Diagnosis not present

## 2020-04-19 DIAGNOSIS — N5201 Erectile dysfunction due to arterial insufficiency: Secondary | ICD-10-CM | POA: Diagnosis not present

## 2020-06-24 DIAGNOSIS — N5201 Erectile dysfunction due to arterial insufficiency: Secondary | ICD-10-CM | POA: Diagnosis not present

## 2020-08-18 ENCOUNTER — Other Ambulatory Visit: Payer: Self-pay

## 2020-08-18 ENCOUNTER — Inpatient Hospital Stay: Payer: BC Managed Care – PPO | Attending: Hematology

## 2020-08-18 ENCOUNTER — Ambulatory Visit (HOSPITAL_COMMUNITY)
Admission: RE | Admit: 2020-08-18 | Discharge: 2020-08-18 | Disposition: A | Discharge status: Home / Self Care | Payer: BC Managed Care – PPO | Source: Ambulatory Visit | Attending: Hematology | Admitting: Hematology

## 2020-08-18 ENCOUNTER — Encounter (HOSPITAL_COMMUNITY): Payer: Self-pay

## 2020-08-18 DIAGNOSIS — C20 Malignant neoplasm of rectum: Secondary | ICD-10-CM | POA: Insufficient documentation

## 2020-08-18 DIAGNOSIS — K8689 Other specified diseases of pancreas: Secondary | ICD-10-CM | POA: Diagnosis not present

## 2020-08-18 DIAGNOSIS — K6289 Other specified diseases of anus and rectum: Secondary | ICD-10-CM | POA: Diagnosis not present

## 2020-08-18 LAB — CBC WITH DIFFERENTIAL/PLATELET
Abs Immature Granulocytes: 0.01 10*3/uL (ref 0.00–0.07)
Basophils Absolute: 0 10*3/uL (ref 0.0–0.1)
Basophils Relative: 1 %
Eosinophils Absolute: 0.2 10*3/uL (ref 0.0–0.5)
Eosinophils Relative: 4 %
HCT: 41 % (ref 39.0–52.0)
Hemoglobin: 14.1 g/dL (ref 13.0–17.0)
Immature Granulocytes: 0 %
Lymphocytes Relative: 26 %
Lymphs Abs: 1.2 10*3/uL (ref 0.7–4.0)
MCH: 30 pg (ref 26.0–34.0)
MCHC: 34.4 g/dL (ref 30.0–36.0)
MCV: 87.2 fL (ref 80.0–100.0)
Monocytes Absolute: 0.5 10*3/uL (ref 0.1–1.0)
Monocytes Relative: 11 %
Neutro Abs: 2.6 10*3/uL (ref 1.7–7.7)
Neutrophils Relative %: 58 %
Platelets: 196 10*3/uL (ref 150–400)
RBC: 4.7 MIL/uL (ref 4.22–5.81)
RDW: 12.8 % (ref 11.5–15.5)
WBC: 4.4 10*3/uL (ref 4.0–10.5)
nRBC: 0 % (ref 0.0–0.2)

## 2020-08-18 LAB — COMPREHENSIVE METABOLIC PANEL
ALT: 26 U/L (ref 0–44)
AST: 25 U/L (ref 15–41)
Albumin: 4.4 g/dL (ref 3.5–5.0)
Alkaline Phosphatase: 76 U/L (ref 38–126)
Anion gap: 8 (ref 5–15)
BUN: 9 mg/dL (ref 6–20)
CO2: 30 mmol/L (ref 22–32)
Calcium: 9.7 mg/dL (ref 8.9–10.3)
Chloride: 103 mmol/L (ref 98–111)
Creatinine, Ser: 0.91 mg/dL (ref 0.61–1.24)
GFR, Estimated: >60 mL/min (ref >60)
Glucose, Bld: 97 mg/dL (ref 70–99)
Potassium: 4.1 mmol/L (ref 3.5–5.1)
Sodium: 141 mmol/L (ref 135–145)
Total Bilirubin: 0.6 mg/dL (ref 0.3–1.2)
Total Protein: 7.3 g/dL (ref 6.5–8.1)

## 2020-08-18 LAB — CEA (IN HOUSE-CHCC): CEA (CHCC-In House): 2.54 ng/mL (ref 0.00–5.00)

## 2020-08-18 MED ORDER — IOHEXOL 300 MG/ML  SOLN
100.0000 mL | Freq: Once | INTRAMUSCULAR | Status: AC | PRN
  Administered 2020-08-18: 100 mL via INTRAVENOUS

## 2020-08-20 ENCOUNTER — Telehealth: Payer: Self-pay | Admitting: Hematology

## 2020-08-20 ENCOUNTER — Inpatient Hospital Stay: Payer: BC Managed Care – PPO | Admitting: Hematology

## 2020-08-20 NOTE — Telephone Encounter (Signed)
Rescheduled missed appt on 10/29 to 11/11. Pt prefer [facility] and is aware of appt time and date.

## 2020-09-01 NOTE — Progress Notes (Signed)
[place]   [phone] [phone]   Clinic Follow up Note   Patient Care Team: Eulas Post, MD as PCP - General (Family Medicine) Arta Silence, MD as Consulting Physician (Gastroenterology) Michael Boston, MD as Consulting Physician (General Surgery) Kyung Rudd, MD as Consulting Physician (Radiation Oncology) Truitt Merle, MD as Consulting Physician (Oncology)  Date of Service:  09/02/2020  CHIEF COMPLAINT: F/u of rectal cancer  SUMMARY OF ONCOLOGIC HISTORY: Oncology History Overview Note  Cancer Staging Rectal adenocarcinoma [facility]) Staging form: Colon and Rectum, AJCC 8th Edition - Clinical stage from 07/06/2017: Stage IIIB (cT3, cN1, cM0) - Signed by [name], NP on 07/10/2017    Rectal adenocarcinoma s/p robotic LAR resection 10/10/2017  07/03/2017 Imaging   CT ABD PELVIS W CONTRAST IMPRESSION: Possible mass in the rectum compatible carcinoma. Endoscopy recommended for further evaluation. Otherwise negative.   07/03/2017 Tumor Marker   CEA 2.7   07/05/2017 Initial Biopsy   Rectum biopsy -invasive adenocarcinoma, poorly differentiated   07/05/2017 Procedure   Colonoscopy Per Dr. Paulita Fujita Findings: The perianal and digital rectal examinations were normal.  Internal hemorrhoids were found during retroflexion. The hemorrhoids were moderate. No additional abnormalities were found on retroflexion.  A fungating, sessile and ulcerated partially obstructing large mass was found in the recto-sigmoid colon.The mass was partially circumferential (involving two-thirds of the lumen circumference). Oozing was present. Area was successfully injected with 2 mL [place] ink for tattooing. This was biopsied with a cold forceps for histology.   07/05/2017 Initial Diagnosis   Rectal adenocarcinoma ([place])   07/06/2017 Imaging   Staging MRI ABD/PELVIS revealed clinical stage T3N1M0, IIIb    07/18/2017 - 08/24/2017 Radiation Therapy   Radaition  with Dr Lisbeth Renshaw     Radiation treatment dates:   07/18/2017 - 08/24/2017  Site/dose:   The rectum was treated to 45 Gy in 25 fractions of 1.8 Gy, followed by a 5.4 Gy boost in 3 fractions to yield a total dose of 50.4 Gy.  Narrative: The patient tolerated radiation treatment relatively well.   He had rectal bleeding with clots that was bright red after bowel movements. He reported loose stools that were painful. The skin to the radiation site was irritated, but no skin breakage was noted.    07/18/2017 Imaging   CT CHEST  IMPRESSION: Negative. No evidence of thoracic metastatic disease or other significant abnormality.    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  The Hereditary Gene Panel offered by [facility] includes sequencing and/or deletion duplication testing of the following 47 genes: APC, ATM, AXIN2, BARD1, BMPR1A, BRCA1, BRCA2, BRIP1, CDH1, CDK4, CDKN2A (p14ARF), CDKN2A (p16INK4a), CHEK2, CTNNA1, DICER1, EPCAM (Deletion/duplication testing only), GREM1 (promoter region deletion/duplication testing only), KIT, MEN1, MLH1, MSH2, MSH3, MSH6, MUTYH, NBN, NF1, NHTL1, PALB2, PDGFRA, PMS2, POLD1, POLE, PTEN, RAD50, RAD51C, RAD51D, SDHB, SDHC, SDHD, SMAD4, SMARCA4. STK11, TP53, TSC1, TSC2, and VHL.  The following genes were evaluated for sequence changes only: SDHA and HOXB13 c.251G>A variant only.  The report date is August 27, 2017.    10/10/2017 Surgery   XI ROBOTIC ASSISTED LOWER ANTERIOR RESECTION AND RIGID PROCTOSCOPY bu Dr. GrJohney Mainend Dr. WhDema Severin12/19/18    10/10/2017 Pathology Results       Diagnosis 10/10/17  1. Colon, segmental resection for tumor, sigmoid SMALL FOCUS OF  COLONIC GLANDS WITH HIGH GRADE DYSPLASIA POST NEOADJUVANT CHEMORADIATION THERAPY BIOPSY SITE WITH CHRONIC  INFLAMMATION NO RESIDULE INVASIVE CARCINOMA PRESENT TWENTY-TWO BENIGN LYMPH NODES (0/22) 2. Colon, resection margin (donut), final distal BENIGN COLONIC TISSUE   11/19/2017 - 03/04/2018 Adjuvant Chemotherapy   He started adjuvant 2000 mg Xeloda BID 2 weeks on 1 week off on 11/19/17 and completed 5 cycles on 03/04/18   04/08/2018 Imaging   CT AP W Contrast 04/08/18 IMPRESSION: 1. Postoperative and post treatment related changes in the low anatomic pelvis, as above. Today's study will serve as a new baseline for future follow-up examinations. At this time, there is no definitive evidence to strongly suggest local recurrence of disease or definite metastatic disease in the abdomen or pelvis. 2. Mild pancreatic ductal dilatation. This is of uncertain etiology and significance. No obstructing lesion identified in the pancreatic head. Correlation with nonemergent MRI of the abdomen with and without IV gadolinium with MRCP is suggested in the near future to evaluate for the etiology of this dilatation.    10/21/2018 Imaging   CT CAP W Contrast 10/21/18 IMPRESSION: Status post low anterior resection with suspected radiation changes in the presacral space. No evidence of recurrent or metastatic disease.   10/03/2019 Imaging   CT CAP W Contast  IMPRESSION: 1. No findings of recurrent malignancy. 2. Abnormal prominence of the dorsal pancreatic duct in the pancreatic body and head, measuring up to 6 mm in diameter, similar to prior, cause uncertain. 3. Wall thickening in the lower rectum below the level of the anastomotic staple line, probably therapy related. 4. Stable presacral soft tissue density, likely therapy related. 5. Mild degenerative disc disease at L4-5 and L5-S1.       08/18/2020 Imaging   CT CAP  IMPRESSION: 1. Redemonstrated postoperative findings of low anterior resection and reanastomosis with unchanged rectal thickening and presacral soft tissue thickening. No  evidence of locally recurrent or metastatic disease in the chest, abdomen, or pelvis. 2. Prominence of the pancreatic duct is decreased compared to prior examination, generally remains of uncertain significance. Attention on follow-up.      CURRENT THERAPY:  Surveillance  INTERVAL HISTORY:  Namish Krise is here for a follow up of rectal cancer. He was last seen by me 7 months ago. He presents to the clinic alone.  He is clinically doing well, overall stable.  He still has occasional diarrhea and constipation, which has been chronic since his cancer treatment.  He has occasional low abdominal pain, usually resolves on its own.  His appetite and energy level are normal, no other complaints.  Weight is stable.   All other systems were reviewed with the patient and are negative.  MEDICAL HISTORY:  Past Medical History:  Diagnosis Date  . Allergy    grass, dander  . Essential hypertension 07/03/2017  . Family history of prostate cancer   . GERD (gastroesophageal reflux disease)   . History of meningitis   . Hypertension   . 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 COLONOSCOPY WITH PROPOFOL Left 07/05/2017   Procedure: COLONOSCOPY WITH PROPOFOL;  Surgeon: Arta Silence, MD;  Location: [place];  Service: Endoscopy;  Laterality: Left;  . PROCTOSCOPY N/A 10/10/2017   Procedure: RIGID PROCTOSCOPY;  Surgeon: Michael Boston, MD;  Location: [facility];  Service: General;  Laterality: N/A;  . XI ROBOTIC ASSISTED LOWER ANTERIOR RESECTION N/A  10/10/2017   Procedure: XI ROBOTIC ASSISTED LOWER ANTERIOR RESECTION;  Surgeon: Michael Boston, MD;  Location: [facility];  Service: General;  Laterality: N/A;    I have reviewed the social history and family history with the patient and they are unchanged from previous note.  ALLERGIES:  is  allergic to chloroquine.  MEDICATIONS:  Current Outpatient Medications  Medication Sig Dispense Refill  . amLODipine (NORVASC) 5 MG tablet TAKE 1 TABLET DAILY 90 tablet 3  . calcium carbonate (OS-CAL - DOSED IN MG OF ELEMENTAL CALCIUM) 1250 (500 Ca) MG tablet Take 1 tablet by mouth daily as needed (for bones).    . Calcium Citrate-Vitamin D (CALCIUM CITRATE+D3 PETITES PO) Take 1 tablet by mouth daily.    . cetirizine (ZYRTEC) 10 MG tablet Take 10 mg by mouth daily as needed for allergies.    Marland Kitchen GARLIC PO Take 1 capsule by mouth daily.     . Multiple Vitamin (MULTIVITAMIN WITH MINERALS) TABS tablet Take 1 tablet by mouth daily.    . naproxen (NAPROSYN) 500 MG tablet Take 1 tablet (500 mg total) by mouth every 12 (twelve) hours as needed for mild pain or moderate pain. 30 tablet 1  . omeprazole (PRILOSEC) 40 MG capsule Take 40 mg by mouth daily as needed (heart burn).     . sildenafil (VIAGRA) 100 MG tablet Take 1 tablet (100 mg total) by mouth daily as needed for erectile dysfunction. 10 tablet 11   No current facility-administered medications for this visit.    PHYSICAL EXAMINATION: ECOG PERFORMANCE STATUS: 0 - Asymptomatic  Vitals:   09/02/20 1402  BP: (!) 152/98  Pulse: 72  Resp: 16  Temp: (!) 96.7 F (35.9 C)  SpO2: 100%   Filed Weights   09/02/20 1402  Weight: 207 lb 8 oz (94.1 kg)    GENERAL:alert, no distress and comfortable SKIN: skin color, texture, turgor are normal, no rashes or significant lesions EYES: normal, Conjunctiva are pink and non-injected, sclera clear NECK: supple, thyroid normal size, non-tender, without nodularity LYMPH:  no palpable lymphadenopathy in the cervical, axillary  LUNGS: clear to auscultation and percussion with normal breathing effort HEART: regular rate & rhythm and no murmurs and no lower extremity edema ABDOMEN:abdomen soft, non-tender and normal bowel sounds Musculoskeletal:no cyanosis of digits and no clubbing  NEURO: alert &  oriented x 3 with fluent speech, no focal motor/sensory deficits  LABORATORY DATA:  I have reviewed the data as listed CBC Latest Ref Rng & Units 08/18/2020 02/19/2020 10/22/2019  WBC 4.0 - 10.5 K/uL 4.4 2.7(L) 2.7(L)  Hemoglobin 13.0 - 17.0 g/dL 14.1 13.1 14.2  Hematocrit 39 - 52 % 41.0 38.0(L) 42.4  Platelets 150 - 400 K/uL 196 160 180     CMP Latest Ref Rng & Units 08/18/2020 02/19/2020 10/22/2019  Glucose 70 - 99 mg/dL 97 114(H) 109(H)  BUN 6 - 20 mg/dL _0 Creatinine 0.61 - 1.24 mg/dL 0.91 0.86 0.87  Sodium 135 - 145 mmol/L 141 140 141  Potassium 3.5 - 5.1 mmol/L 4.1 3.7 4.0  Chloride 98 - 111 mmol/L 103 106 104  CO2 22 - 32 mmol/L _1 Calcium 8.9 - 10.3 mg/dL 9.7 8.8(L) 9.0  Total Protein 6.5 - 8.1 g/dL 7.3 7.0 7.8  Total Bilirubin 0.3 - 1.2 mg/dL 0.6 0.5 0.5  Alkaline Phos 38 - 126 U/L 76 76 68  AST 15 - 41 U/L _2 ALT 0 - 44 U/L 26 24 25  RADIOGRAPHIC STUDIES: I have personally reviewed the radiological images as listed and agreed with the findings in the report. No results found.   ASSESSMENT & PLAN:  Paul [name] is a 54 y.o. male with    1. Poorly differentiated invasive rectal adenocarcinoma, cT3N1M0, stage IIIb, ypT0N0 -He was diagnosed in 06/2017. He is s/p concurrent chemoradiation with Xeloda, Surgical resection and 5 cycles of adjuvant Xeloda.  -He has recovered well fromsurgery and chemotreatment  -He completed repeat colonoscopy in 10/11/2018 through [facility]. Will repeat in 3 years. -I personally reviewed and discussed his CT CAP from 08/18/20 which showed no evidence of recurrence. -He is clinically doing well, mild GI symptoms are stable.  No current concern for recurrence -Lab reviewed, CBC, CMP and tumor marker are within normal limits. -He is 3 years out of initial diagnosis, risk of recurrence has decreased significantly.  I do not plan to repeat routine surveillance CT scans. -Follow-up in 6 months with lab  2.  GeneticsNegative for pathogenetic mutations  3.Constipation/loose stool, gas,sexual dysfunction,secondary to his surgeryand radiation. -His Bm lately are more constipated and does not feel his bowel are completely emptied. He is still able to have BM daily with no pain or bleeding. He only has mild leakage.  -He has tried Miralax up to 3 times a day and Metamucil without much help. I recommend Seneka-S or liquid magnesium citrate if he cannot have BM for a few days. I encouraged he eat enough fiber and drink enough water.   4. HTN  -Continue follow-up with PCP  PLAN: -PET scan reviewed, no evidence of recurrence -Continue surveillance, lab and follow-up in 6 months     No problem-specific Assessment & Plan notes found for this encounter.   No orders of the defined types were placed in this encounter.  All questions were answered. The patient knows to call the clinic with any problems, questions or concerns. No barriers to learning was detected. The total time spent in the appointment was 30 minutes.     Truitt Merle, MD 09/02/2020   I, Joslyn Devon, am acting as scribe for Truitt Merle, MD.   I have reviewed the above documentation for accuracy and completeness, and I agree with the above.

## 2020-09-02 ENCOUNTER — Other Ambulatory Visit: Payer: Self-pay

## 2020-09-02 ENCOUNTER — Inpatient Hospital Stay: Payer: BC Managed Care – PPO | Attending: Hematology | Admitting: Hematology

## 2020-09-02 ENCOUNTER — Encounter: Payer: Self-pay | Admitting: Hematology

## 2020-09-02 VITALS — BP 152/98 | HR 72 | Temp 96.7°F | Resp 16 | Ht 71.5 in | Wt 207.5 lb

## 2020-09-02 DIAGNOSIS — Z79899 Other long term (current) drug therapy: Secondary | ICD-10-CM | POA: Diagnosis not present

## 2020-09-02 DIAGNOSIS — I10 Essential (primary) hypertension: Secondary | ICD-10-CM | POA: Diagnosis not present

## 2020-09-02 DIAGNOSIS — C20 Malignant neoplasm of rectum: Secondary | ICD-10-CM | POA: Diagnosis not present

## 2020-10-04 ENCOUNTER — Ambulatory Visit (INDEPENDENT_AMBULATORY_CARE_PROVIDER_SITE_OTHER): Payer: BC Managed Care – PPO | Admitting: Family Medicine

## 2020-10-04 ENCOUNTER — Other Ambulatory Visit: Payer: Self-pay

## 2020-10-04 ENCOUNTER — Encounter: Payer: Self-pay | Admitting: Family Medicine

## 2020-10-04 VITALS — BP 128/88 | HR 77 | Temp 98.2°F | Ht 72.5 in | Wt 200.4 lb

## 2020-10-04 DIAGNOSIS — Z0000 Encounter for general adult medical examination without abnormal findings: Secondary | ICD-10-CM | POA: Diagnosis not present

## 2020-10-04 NOTE — Progress Notes (Signed)
Established Patient Office Visit  Subjective:  Patient ID: Paul [name], male    DOB: 27-Jun-1966  Age: 54 y.o. MRN: [id number]  CC:  Chief Complaint  Patient presents with  . Annual Exam    HPI Paul [name] presents for physical exam.  He was diagnosed about 3 years ago with rectal adenocarcinoma.  He underwent resection 12/18.  He is followed regularly by oncology.  He is done well.  He had recent CBC and comprehensive metabolic panel along with CEA levels which are stable.  He has had some intermittent issues between constipation and loose stools since his surgery.  Appetite and weight are stable.  He has history of hypertension treated with amlodipine.  Blood pressure stable today.  Health maintenance reviewed  -He is not sure if he had chickenpox as a child.  Has not had shingles vaccine.  Other immunizations up-to-date.  Will be due for repeat colonoscopy next year.  Flu vaccine already given.  Covid vaccines up-to-date.  Family history-significant for father having history of prostate cancer.  Has had several family members with asthma history.  Mother still alive and lives in [place]  Social history-patient is married.  Non-smoker.  No alcohol.  He has 3 children ages 48, 43, 43.  He works with [employer]  Past Medical History:  Diagnosis Date  . Allergy    grass, dander  . Essential hypertension 07/03/2017  . Family history of prostate cancer   . GERD (gastroesophageal reflux disease)   . History of meningitis   . Hypertension   . 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 COLONOSCOPY WITH PROPOFOL Left 07/05/2017   Procedure: COLONOSCOPY WITH PROPOFOL;  Surgeon: Arta Silence, MD;  Location: [place];  Service: Endoscopy;  Laterality: Left;  . PROCTOSCOPY N/A 10/10/2017   Procedure: RIGID PROCTOSCOPY;  Surgeon:  Michael Boston, MD;  Location: [facility];  Service: General;  Laterality: N/A;  . XI ROBOTIC ASSISTED LOWER ANTERIOR RESECTION N/A 10/10/2017   Procedure: XI ROBOTIC ASSISTED LOWER ANTERIOR RESECTION;  Surgeon: Michael Boston, MD;  Location: [facility];  Service: General;  Laterality: N/A;    Family History  Problem Relation Age of Onset  . Hypertension Mother   . Asthma Father   . Cancer Father        Prostate  . Asthma Son   . Asthma Son   . Asthma Daughter   . Dementia Maternal Grandmother     Social History   Socioeconomic History  . Marital status: Married    Spouse name: Not on file  . Number of children: Not on file  . Years of education: Not on file  . Highest education level: Not on file  Occupational History  . Not on file  Tobacco Use  . Smoking status: Never Smoker  . Smokeless tobacco: Never Used  Vaping Use  . Vaping Use: Never used  Substance and Sexual Activity  . Alcohol use: No  . Drug use: No  . Sexual activity: Not on file  Other Topics Concern  . Not on file  Social History Narrative   Pronouced "KA[place]noo--[place]Y"   Originally from [place], [place], etc   Social Determinants of Health   Financial Resource Strain: Not on [employer] Insecurity: Not on file  Transportation Needs: Not on file  Physical Activity: Not on file  Stress: Not on file  Social Connections: Not on file  Intimate Partner Violence: Not on file    Outpatient Medications Prior to Visit  Medication Sig Dispense Refill  . amLODipine (NORVASC) 5 MG tablet TAKE 1 TABLET DAILY 90 tablet 3  . cetirizine (ZYRTEC) 10 MG tablet Take 10 mg by mouth daily as needed for allergies.    Marland Kitchen GARLIC PO Take 1 capsule by mouth daily.     . Multiple Vitamin (MULTIVITAMIN WITH MINERALS) TABS tablet Take 1 tablet by mouth daily.    . naproxen (NAPROSYN) 500 MG tablet Take 1 tablet (500 mg total) by mouth every 12 (twelve) hours as needed for mild pain or moderate pain. 30  tablet 1  . omeprazole (PRILOSEC) 40 MG capsule Take 40 mg by mouth daily as needed (heart burn).     . calcium carbonate (OS-CAL - DOSED IN MG OF ELEMENTAL CALCIUM) 1250 (500 Ca) MG tablet Take 1 tablet by mouth daily as needed (for bones).    . Calcium Citrate-Vitamin D (CALCIUM CITRATE+D3 PETITES PO) Take 1 tablet by mouth daily.    . sildenafil (VIAGRA) 100 MG tablet Take 1 tablet (100 mg total) by mouth daily as needed for erectile dysfunction. 10 tablet 11   No facility-administered medications prior to visit.    Allergies  Allergen Reactions  . Chloroquine Itching    ROS Review of Systems  Constitutional: Negative for activity change, appetite change, fatigue and fever.  HENT: Negative for congestion, ear pain and trouble swallowing.   Eyes: Negative for pain and visual disturbance.  Respiratory: Negative for cough, shortness of breath and wheezing.   Cardiovascular: Negative for chest pain and palpitations.  Gastrointestinal: Negative for abdominal distention, abdominal pain, blood in stool, nausea, rectal pain and vomiting.  Genitourinary: Negative for dysuria, hematuria and testicular pain.  Musculoskeletal: Negative for arthralgias and joint swelling.  Skin: Negative for rash.  Neurological: Negative for dizziness, syncope and headaches.  Hematological: Negative for adenopathy.  Psychiatric/Behavioral: Negative for confusion and dysphoric mood.      Objective:    Physical Exam Constitutional:      General: He is not in acute distress.    Appearance: He is well-developed and well-nourished.  HENT:     Head: Normocephalic and atraumatic.     Right Ear: External ear normal.     Left Ear: External ear normal.     Mouth/Throat:     Mouth: Oropharynx is clear and moist.  Eyes:     Extraocular Movements: EOM normal.     Conjunctiva/sclera: Conjunctivae normal.     Pupils: Pupils are equal, round, and reactive to light.  Neck:     Thyroid: No thyromegaly.   Cardiovascular:     Rate and Rhythm: Normal rate and regular rhythm.     Heart sounds: Normal heart sounds. No murmur heard.   Pulmonary:     Effort: No respiratory distress.     Breath sounds: No wheezing or rales.  Abdominal:     General: Bowel sounds are normal. There is no distension.     Palpations: Abdomen is soft. There is no mass.     Tenderness: There is no abdominal tenderness. There is no guarding or rebound.  Musculoskeletal:        General: No edema.     Cervical back: Normal range of motion and neck supple.  Lymphadenopathy:     Cervical: No cervical adenopathy.  Skin:    Findings: No rash.  Neurological:  Mental Status: He is alert and oriented to person, place, and time.     Cranial Nerves: No cranial nerve deficit.     Deep Tendon Reflexes: Reflexes normal.  Psychiatric:        Mood and Affect: Mood and affect normal.     BP 128/88 (BP Location: Left Arm, Patient Position: Sitting, Cuff Size: Large)   Pulse 77   Temp 98.2 F (36.8 C) (Temporal)   Ht 6' 0.5" (1.842 m)   Wt 200 lb 6.1 oz (90.9 kg)   SpO2 97%   BMI 26.80 kg/m  Wt Readings from Last 3 Encounters:  10/04/20 200 lb 6.1 oz (90.9 kg)  09/02/20 207 lb 8 oz (94.1 kg)  02/19/20 197 lb (89.4 kg)     Health Maintenance Due  Topic Date Due  . Hepatitis C Screening  Never done  . HIV Screening  Never done    There are no preventive care reminders to display for this patient.  Lab Results  Component Value Date   TSH 1.75 10/18/2018   Lab Results  Component Value Date   WBC 4.4 08/18/2020   HGB 14.1 08/18/2020   HCT 41.0 08/18/2020   MCV 87.2 08/18/2020   PLT 196 08/18/2020   Lab Results  Component Value Date   NA 141 08/18/2020   K 4.1 08/18/2020   CHLORIDE 104 10/25/2017   CO2 30 08/18/2020   GLUCOSE 97 08/18/2020   BUN 9 08/18/2020   CREATININE 0.91 08/18/2020   BILITOT 0.6 08/18/2020   ALKPHOS 76 08/18/2020   AST 25 08/18/2020   ALT 26 08/18/2020   PROT 7.3  08/18/2020   ALBUMIN 4.4 08/18/2020   CALCIUM 9.7 08/18/2020   ANIONGAP 8 08/18/2020   EGFR >60 10/25/2017   GFR 122.84 10/19/2017   Lab Results  Component Value Date   CHOL 109 10/18/2018   Lab Results  Component Value Date   HDL 61.20 10/18/2018   Lab Results  Component Value Date   LDLCALC 34 10/18/2018   Lab Results  Component Value Date   TRIG 67.0 10/18/2018   Lab Results  Component Value Date   CHOLHDL 2 10/18/2018   Lab Results  Component Value Date   HGBA1C 5.4 10/08/2017      Assessment & Plan:   Problem List Items Addressed This Visit   None   Visit Diagnoses    Physical exam    -  Primary   Relevant Orders   Basic metabolic panel   Lipid panel   CBC with Differential/Platelet   TSH   Hepatic function panel   PSA   Hep C Antibody   Varicella Zoster Antibody, IgG     -Flu vaccine already given -We discussed shingles vaccine.  He is uncertain regarding whether he had chickenpox.  Will check varicella IgG antibody.  If positive, he will consider returning for shingles vaccine -Obtain screening labs.  Include PSA with his family history of prostate cancer -Continue regular follow-up with oncology  No orders of the defined types were placed in this encounter.   Follow-up: No follow-ups on file.    Carolann Littler, MD

## 2020-10-04 NOTE — Patient Instructions (Signed)
Preventive Care 52-52 Years Old, Male Preventive care refers to lifestyle choices and visits with your health care provider that can promote health and wellness. This includes:  A yearly physical exam. This is also called an annual well check.  Regular dental and eye exams.  Immunizations.  Screening for certain conditions.  Healthy lifestyle choices, such as eating a healthy diet, getting regular exercise, not using drugs or products that contain nicotine and tobacco, and limiting alcohol use. What can I expect for my preventive care visit? Physical exam Your health care provider will check:  Height and weight. These may be used to calculate body mass index (BMI), which is a measurement that tells if you are at a healthy weight.  Heart rate and blood pressure.  Your skin for abnormal spots. Counseling Your health care provider may ask you questions about:  Alcohol, tobacco, and drug use.  Emotional well-being.  Home and relationship well-being.  Sexual activity.  Eating habits.  Work and work Statistician. What immunizations do I need?  Influenza (flu) vaccine  This is recommended every year. Tetanus, diphtheria, and pertussis (Tdap) vaccine  You may need a Td booster every 10 years. Varicella (chickenpox) vaccine  You may need this vaccine if you have not already been vaccinated. Zoster (shingles) vaccine  You may need this after age 64. Measles, mumps, and rubella (MMR) vaccine  You may need at least one dose of MMR if you were born in 1957 or later. You may also need a second dose. Pneumococcal conjugate (PCV13) vaccine  You may need this if you have certain conditions and were not previously vaccinated. Pneumococcal polysaccharide (PPSV23) vaccine  You may need one or two doses if you smoke cigarettes or if you have certain conditions. Meningococcal conjugate (MenACWY) vaccine  You may need this if you have certain conditions. Hepatitis A  vaccine  You may need this if you have certain conditions or if you travel or work in places where you may be exposed to hepatitis A. Hepatitis B vaccine  You may need this if you have certain conditions or if you travel or work in places where you may be exposed to hepatitis B. Haemophilus influenzae type b (Hib) vaccine  You may need this if you have certain risk factors. Human papillomavirus (HPV) vaccine  If recommended by your health care provider, you may need three doses over 6 months. You may receive vaccines as individual doses or as more than one vaccine together in one shot (combination vaccines). Talk with your health care provider about the risks and benefits of combination vaccines. What tests do I need? Blood tests  Lipid and cholesterol levels. These may be checked every 5 years, or more frequently if you are over 60 years old.  Hepatitis C test.  Hepatitis B test. Screening  Lung cancer screening. You may have this screening every year starting at age 52 if you have a 30-pack-year history of smoking and currently smoke or have quit within the past 15 years.  Prostate cancer screening. Recommendations will vary depending on your family history and other risks.  Colorectal cancer screening. All adults should have this screening starting at age 52 and continuing until age 2. Your health care provider may recommend screening at age 52 if you are at increased risk. You will have tests every 1-10 years, depending on your results and the type of screening test.  Diabetes screening. This is done by checking your blood sugar (glucose) after you have not eaten  for a while (fasting). You may have this done every 1-3 years.  Sexually transmitted disease (STD) testing. Follow these instructions at home: Eating and drinking  Eat a diet that includes fresh fruits and vegetables, whole grains, lean protein, and low-fat dairy products.  Take vitamin and mineral supplements as  recommended by your health care provider.  Do not drink alcohol if your health care provider tells you not to drink.  If you drink alcohol: ? Limit how much you have to 0-2 drinks a day. ? Be aware of how much alcohol is in your drink. In the [place] one drink equals one 12 oz bottle of beer (355 mL), one 5 oz glass of wine (148 mL), or one 1 oz glass of hard liquor (44 mL). Lifestyle  Take daily care of your teeth and gums.  Stay active. Exercise for at least 30 minutes on 5 or more days each week.  Do not use any products that contain nicotine or tobacco, such as cigarettes, e-cigarettes, and chewing tobacco. If you need help quitting, ask your health care provider.  If you are sexually active, practice safe sex. Use a condom or other form of protection to prevent STIs (sexually transmitted infections).  Talk with your health care provider about taking a low-dose aspirin every day starting at age 52. What's next?  Go to your health care provider once a year for a well check visit.  Ask your health care provider how often you should have your eyes and teeth checked.  Stay up to date on all vaccines. This information is not intended to replace advice given to you by your health care provider. Make sure you discuss any questions you have with your health care provider. Document Revised: 10/03/2018 Document Reviewed: 10/03/2018 Elsevier Patient Education  2020 [employer].

## 2020-10-05 LAB — CBC WITH DIFFERENTIAL/PLATELET
Absolute Monocytes: 251 cells/uL (ref 200–950)
Basophils Absolute: 19 cells/uL (ref 0–200)
Basophils Relative: 0.6 %
Eosinophils Absolute: 130 cells/uL (ref 15–500)
Eosinophils Relative: 4.2 %
HCT: 43 % (ref 38.5–50.0)
Hemoglobin: 14.9 g/dL (ref 13.2–17.1)
Lymphs Abs: 930 cells/uL (ref 850–3900)
MCH: 30 pg (ref 27.0–33.0)
MCHC: 34.7 g/dL (ref 32.0–36.0)
MCV: 86.5 fL (ref 80.0–100.0)
MPV: 11.2 fL (ref 7.5–12.5)
Monocytes Relative: 8.1 %
Neutro Abs: 1770 cells/uL (ref 1500–7800)
Neutrophils Relative %: 57.1 %
Platelets: 198 10*3/uL (ref 140–400)
RBC: 4.97 10*6/uL (ref 4.20–5.80)
RDW: 13.4 % (ref 11.0–15.0)
Total Lymphocyte: 30 %
WBC: 3.1 10*3/uL — ABNORMAL LOW (ref 3.8–10.8)

## 2020-10-05 LAB — VARICELLA ZOSTER ANTIBODY, IGG: Varicella IgG: 1163 index

## 2020-10-05 LAB — HEPATIC FUNCTION PANEL
AG Ratio: 1.8 (calc) (ref 1.0–2.5)
ALT: 22 U/L (ref 9–46)
AST: 20 U/L (ref 10–35)
Albumin: 4.9 g/dL (ref 3.6–5.1)
Alkaline phosphatase (APISO): 65 U/L (ref 35–144)
Bilirubin, Direct: 0.2 mg/dL (ref 0.0–0.2)
Globulin: 2.8 g/dL (calc) (ref 1.9–3.7)
Indirect Bilirubin: 0.4 mg/dL (calc) (ref 0.2–1.2)
Total Bilirubin: 0.6 mg/dL (ref 0.2–1.2)
Total Protein: 7.7 g/dL (ref 6.1–8.1)

## 2020-10-05 LAB — BASIC METABOLIC PANEL
BUN: 10 mg/dL (ref 7–25)
CO2: 27 mmol/L (ref 20–32)
Calcium: 9.7 mg/dL (ref 8.6–10.3)
Chloride: 104 mmol/L (ref 98–110)
Creat: 0.82 mg/dL (ref 0.70–1.33)
Glucose, Bld: 96 mg/dL (ref 65–99)
Potassium: 4.6 mmol/L (ref 3.5–5.3)
Sodium: 143 mmol/L (ref 135–146)

## 2020-10-05 LAB — LIPID PANEL
Cholesterol: 127 mg/dL (ref <200)
HDL: 65 mg/dL (ref >=40)
LDL Cholesterol (Calc): 44 mg/dL (calc)
Non-HDL Cholesterol (Calc): 62 mg/dL (calc) (ref <130)
Total CHOL/HDL Ratio: 2 (calc) (ref <5.0)
Triglycerides: 93 mg/dL (ref <150)

## 2020-10-05 LAB — PSA: PSA: 0.8 ng/mL (ref <=4.0)

## 2020-10-05 LAB — HEPATITIS C ANTIBODY
Hepatitis C Ab: NONREACTIVE
SIGNAL TO CUT-OFF: 0.05 (ref <1.00)

## 2020-10-05 LAB — TSH: TSH: 1.54 mIU/L (ref 0.40–4.50)

## 2020-10-06 NOTE — Progress Notes (Signed)
Labs all look great thus far.  PSA is normal.  He has chronic mild low white count which is clinically insignificant.  This is stable compared with prior values.  Varicella IgG level and hepatitis C antibody pending

## 2020-10-06 NOTE — Progress Notes (Signed)
Varicella IgG antibody is positive indicating that he has had chickenpox previously.  He should consider possible shingles vaccine at some point

## 2021-02-28 NOTE — Progress Notes (Signed)
[facility]   Telephone:([phone]) [phone] [phone]   Clinic Follow up Note   Patient Care Team: Eulas Post, MD as PCP - General (Family Medicine) Arta Silence, MD as Consulting Physician (Gastroenterology) Michael Boston, MD as Consulting Physician (General Surgery) Kyung Rudd, MD as Consulting Physician (Radiation Oncology) Truitt Merle, MD as Consulting Physician (Oncology)  Date of Service:  03/04/2021  CHIEF COMPLAINT: F/u of rectal cancer  SUMMARY OF ONCOLOGIC HISTORY: Oncology History Overview Note  Cancer Staging Rectal adenocarcinoma [facility]) Staging form: Colon and Rectum, AJCC 8th Edition - Clinical stage from 07/06/2017: Stage IIIB (cT3, cN1, cM0) - Signed by [name], NP on 07/10/2017    Rectal adenocarcinoma s/p robotic LAR resection 10/10/2017  07/03/2017 Imaging   CT ABD PELVIS W CONTRAST IMPRESSION: Possible mass in the rectum compatible carcinoma. Endoscopy recommended for further evaluation. Otherwise negative.   07/03/2017 Tumor Marker   CEA 2.7   07/05/2017 Initial Biopsy   Rectum biopsy -invasive adenocarcinoma, poorly differentiated   07/05/2017 Procedure   Colonoscopy Per Dr. Paulita Fujita Findings: The perianal and digital rectal examinations were normal.  Internal hemorrhoids were found during retroflexion. The hemorrhoids were moderate. No additional abnormalities were found on retroflexion.  A fungating, sessile and ulcerated partially obstructing large mass was found in the recto-sigmoid colon.The mass was partially circumferential (involving two-thirds of the lumen circumference). Oozing was present. Area was successfully injected with 2 mL [place] ink for tattooing. This was biopsied with a cold forceps for histology.   07/05/2017 Initial Diagnosis   Rectal adenocarcinoma ([place])   07/06/2017 Imaging   Staging MRI ABD/PELVIS revealed clinical stage T3N1M0, IIIb    07/18/2017 - 08/24/2017 Radiation Therapy   Radaition  with Dr Lisbeth Renshaw     Radiation treatment dates:   07/18/2017 - 08/24/2017  Site/dose:   The rectum was treated to 45 Gy in 25 fractions of 1.8 Gy, followed by a 5.4 Gy boost in 3 fractions to yield a total dose of 50.4 Gy.  Narrative: The patient tolerated radiation treatment relatively well.   He had rectal bleeding with clots that was bright red after bowel movements. He reported loose stools that were painful. The skin to the radiation site was irritated, but no skin breakage was noted.    07/18/2017 Imaging   CT CHEST  IMPRESSION: Negative. No evidence of thoracic metastatic disease or other significant abnormality.    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  The Hereditary Gene Panel offered by [facility] includes sequencing and/or deletion duplication testing of the following 47 genes: APC, ATM, AXIN2, BARD1, BMPR1A, BRCA1, BRCA2, BRIP1, CDH1, CDK4, CDKN2A (p14ARF), CDKN2A (p16INK4a), CHEK2, CTNNA1, DICER1, EPCAM (Deletion/duplication testing only), GREM1 (promoter region deletion/duplication testing only), KIT, MEN1, MLH1, MSH2, MSH3, MSH6, MUTYH, NBN, NF1, NHTL1, PALB2, PDGFRA, PMS2, POLD1, POLE, PTEN, RAD50, RAD51C, RAD51D, SDHB, SDHC, SDHD, SMAD4, SMARCA4. STK11, TP53, TSC1, TSC2, and VHL.  The following genes were evaluated for sequence changes only: SDHA and HOXB13 c.251G>A variant only.  The report date is August 27, 2017.    10/10/2017 Surgery   XI ROBOTIC ASSISTED LOWER ANTERIOR RESECTION AND RIGID PROCTOSCOPY bu Dr. GrJohney Mainend Dr. WhDema Severin12/19/18    10/10/2017 Pathology Results       Diagnosis 10/10/17  1. Colon, segmental resection for tumor, sigmoid SMALL FOCUS OF  COLONIC GLANDS WITH HIGH GRADE DYSPLASIA POST NEOADJUVANT CHEMORADIATION THERAPY BIOPSY SITE WITH CHRONIC  INFLAMMATION NO RESIDULE INVASIVE CARCINOMA PRESENT TWENTY-TWO BENIGN LYMPH NODES (0/22) 2. Colon, resection margin (donut), final distal BENIGN COLONIC TISSUE   11/19/2017 - 03/04/2018 Adjuvant Chemotherapy   He started adjuvant 2000 mg Xeloda BID 2 weeks on 1 week off on 11/19/17 and completed 5 cycles on 03/04/18   04/08/2018 Imaging   CT AP W Contrast 04/08/18 IMPRESSION: 1. Postoperative and post treatment related changes in the low anatomic pelvis, as above. Today's study will serve as a new baseline for future follow-up examinations. At this time, there is no definitive evidence to strongly suggest local recurrence of disease or definite metastatic disease in the abdomen or pelvis. 2. Mild pancreatic ductal dilatation. This is of uncertain etiology and significance. No obstructing lesion identified in the pancreatic head. Correlation with nonemergent MRI of the abdomen with and without IV gadolinium with MRCP is suggested in the near future to evaluate for the etiology of this dilatation.    10/21/2018 Imaging   CT CAP W Contrast 10/21/18 IMPRESSION: Status post low anterior resection with suspected radiation changes in the presacral space. No evidence of recurrent or metastatic disease.   10/03/2019 Imaging   CT CAP W Contast  IMPRESSION: 1. No findings of recurrent malignancy. 2. Abnormal prominence of the dorsal pancreatic duct in the pancreatic body and head, measuring up to 6 mm in diameter, similar to prior, cause uncertain. 3. Wall thickening in the lower rectum below the level of the anastomotic staple line, probably therapy related. 4. Stable presacral soft tissue density, likely therapy related. 5. Mild degenerative disc disease at L4-5 and L5-S1.       08/18/2020 Imaging   CT CAP  IMPRESSION: 1. Redemonstrated postoperative findings of low anterior resection and reanastomosis with unchanged rectal thickening and presacral soft tissue thickening. No  evidence of locally recurrent or metastatic disease in the chest, abdomen, or pelvis. 2. Prominence of the pancreatic duct is decreased compared to prior examination, generally remains of uncertain significance. Attention on follow-up.      CURRENT THERAPY:  Surveillance  INTERVAL HISTORY:  Melissa Pulido is here for a follow up of rectal cancer. He was last seen by me 6 months ago. He presents to the clinic alone. He notes he is doing well. He denies any major changes. He notes he lost weight during Ramadan. He notes his diarrhea had improved, but now more constipated. He notes he still has moderate gas. I reviewed his medication list with him. He notes he did not take HTN medication today and his BP is at 147/97. He does not check BP at home.     REVIEW OF SYSTEMS:   Constitutional: Denies fevers, chills or abnormal weight loss Eyes: Denies blurriness of vision Ears, nose, mouth, throat, and face: Denies mucositis or sore throat Respiratory: Denies cough, dyspnea or wheezes Cardiovascular: Denies palpitation, chest discomfort or lower extremity swelling Gastrointestinal:  Denies nausea, heartburn (+) Gas (+) Constipation Skin: Denies abnormal skin rashes Lymphatics: Denies new lymphadenopathy or easy bruising Neurological:Denies numbness, tingling or new weaknesses Behavioral/Psych: Mood is stable, no new changes  All other systems were reviewed with the patient and are negative.  MEDICAL HISTORY:  Past Medical History:  Diagnosis Date  . Allergy    grass, dander  . Essential hypertension 07/03/2017  . Family history of prostate cancer   . GERD (gastroesophageal reflux disease)   . History of meningitis   .  Hypertension   . 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 COLONOSCOPY WITH PROPOFOL Left 07/05/2017    Procedure: COLONOSCOPY WITH PROPOFOL;  Surgeon: Arta Silence, MD;  Location: [facility];  Service: Endoscopy;  Laterality: Left;  . PROCTOSCOPY N/A 10/10/2017   Procedure: RIGID PROCTOSCOPY;  Surgeon: Michael Boston, MD;  Location: [facility];  Service: General;  Laterality: N/A;  . XI ROBOTIC ASSISTED LOWER ANTERIOR RESECTION N/A 10/10/2017   Procedure: XI ROBOTIC ASSISTED LOWER ANTERIOR RESECTION;  Surgeon: Michael Boston, MD;  Location: [facility];  Service: General;  Laterality: N/A;    I have reviewed the social history and family history with the patient and they are unchanged from previous note.  ALLERGIES:  is allergic to chloroquine.  MEDICATIONS:  Current Outpatient Medications  Medication Sig Dispense Refill  . amLODipine (NORVASC) 5 MG tablet TAKE 1 TABLET DAILY 90 tablet 3  . cetirizine (ZYRTEC) 10 MG tablet Take 10 mg by mouth daily as needed for allergies.    Marland Kitchen GARLIC PO Take 1 capsule by mouth daily.     . Multiple Vitamin (MULTIVITAMIN WITH MINERALS) TABS tablet Take 1 tablet by mouth daily.    . naproxen (NAPROSYN) 500 MG tablet Take 1 tablet (500 mg total) by mouth every 12 (twelve) hours as needed for mild pain or moderate pain. 30 tablet 1  . omeprazole (PRILOSEC) 40 MG capsule Take 40 mg by mouth daily as needed (heart burn).      No current facility-administered medications for this visit.    PHYSICAL EXAMINATION: ECOG PERFORMANCE STATUS: 0 - Asymptomatic  Vitals:   03/04/21 1114  BP: (!) 147/97  Pulse: 72  Resp: 17  Temp: 97.6 F (36.4 C)  SpO2: 100%   Filed Weights   03/04/21 1114  Weight: 200 lb (90.7 kg)    GENERAL:alert, no distress and comfortable SKIN: skin color, texture, turgor are normal, no rashes or significant lesions EYES: normal, Conjunctiva are pink and non-injected, sclera clear  NECK: supple, thyroid normal size, non-tender, without nodularity LYMPH:  no palpable lymphadenopathy in the cervical, axillary or inguinal LUNGS: clear to  auscultation and percussion with normal breathing effort HEART: regular rate & rhythm and no murmurs and no lower extremity edema ABDOMEN:abdomen soft, non-tender and normal bowel sounds Musculoskeletal:no cyanosis of digits and no clubbing  NEURO: alert & oriented x 3 with fluent speech, no focal motor/sensory deficits RECTAL: No palpable mass. No blood on glove. Normal stool present. Benign exam    LABORATORY DATA:  I have reviewed the data as listed CBC Latest Ref Rng & Units 03/04/2021 10/04/2020 08/18/2020  WBC 4.0 - 10.5 K/uL 3.5(L) 3.1(L) 4.4  Hemoglobin 13.0 - 17.0 g/dL 13.6 14.9 14.1  Hematocrit 39.0 - 52.0 % 39.7 43.0 41.0  Platelets 150 - 400 K/uL 190 198 196     CMP Latest Ref Rng & Units 10/04/2020 08/18/2020 02/19/2020  Glucose 65 - 99 mg/dL 96 97 114(H)  BUN 7 - 25 mg/dL '10 9 10  ' Creatinine 0.70 - 1.33 mg/dL 0.82 0.91 0.86  Sodium 135 - 146 mmol/L 143 141 140  Potassium 3.5 - 5.3 mmol/L 4.6 4.1 3.7  Chloride 98 - 110 mmol/L 104 103 106  CO2 20 - 32 mmol/L '27 30 27  ' Calcium 8.6 - 10.3 mg/dL 9.7 9.7 8.8(L)  Total Protein 6.1 - 8.1 g/dL 7.7 7.3 7.0  Total Bilirubin 0.2 - 1.2 mg/dL 0.6 0.6 0.5  Alkaline Phos 38 - 126 U/L - 76 76  AST 10 - 35 U/L '20 25 24  ' ALT 9 - 46 U/L '22 26 24      ' RADIOGRAPHIC STUDIES: I have personally reviewed the radiological images as listed and agreed with the findings in the report. No results found.   ASSESSMENT & PLAN:  Paul [name] is a 55 y.o. male with   1. Poorly differentiated invasive rectal adenocarcinoma, cT3N1M0, stage IIIb, ypT0N0 -He was diagnosed in 06/2017. He is s/p concurrent chemoradiation with Xeloda, Surgical resection and 5 cycles of adjuvant Xeloda. He is currently on surveillance.  -He underwent surveillance colonoscopy on 10/11/2018 through with Dr Michail Sermon. Will repeat in 09/2021 -He is clinically doing well. Labs reviewed, CBC and CMP WNL except WBC 3.5, ANC 1.5. Physical exam unremarkable. There is no  clinical concern for cancer recurrence.  -He is 3.5 years since his cancer diagnosis. His risk of recurrence has significantly decreased.  -f/u in 6 months then in a year.   2. GeneticsNegative for pathogenetic mutations  3.Constipation/loose stool, gas,sexual dysfunction,secondary to his surgeryand radiation. -His BM lately are more constipated. He is still able to have BM daily with no pain or bleeding. He only has mild leakage.  -I recommend he keep fiber in diet, drink more water and can use OTC Miralax or senakot.   4. HTN, Cancer screening -Continue follow-up with PCP -BP elevated at 147/97 today (03/04/21). Pt notes he did not take his HTN meds this morning. I recommend he start checking his BP at home.  -I recommend he continue age appropriate cancer screenings  PLAN: -Lab and F/u in 6 months  -Colonoscopy in 09/2021. I will copy note to Dr Michail Sermon.    No problem-specific Assessment & Plan notes found for this encounter.   No orders of the defined types were placed in this encounter.  All questions were answered. The patient knows to call the clinic with any problems, questions or concerns. No barriers to learning was detected. The total time spent in the appointment was 25 minutes.     Truitt Merle, MD 03/04/2021   I, Joslyn Devon, am acting as scribe for Truitt Merle, MD.   I have reviewed the above documentation for accuracy and completeness, and I agree with the above.

## 2021-03-04 ENCOUNTER — Inpatient Hospital Stay (HOSPITAL_BASED_OUTPATIENT_CLINIC_OR_DEPARTMENT_OTHER): Payer: BC Managed Care – PPO | Admitting: Hematology

## 2021-03-04 ENCOUNTER — Telehealth: Payer: Self-pay | Admitting: Hematology

## 2021-03-04 ENCOUNTER — Other Ambulatory Visit: Payer: Self-pay

## 2021-03-04 ENCOUNTER — Inpatient Hospital Stay: Payer: BC Managed Care – PPO | Attending: Hematology

## 2021-03-04 VITALS — BP 147/97 | HR 72 | Temp 97.6°F | Resp 17 | Ht 72.5 in | Wt 200.0 lb

## 2021-03-04 DIAGNOSIS — C20 Malignant neoplasm of rectum: Secondary | ICD-10-CM

## 2021-03-04 DIAGNOSIS — K5900 Constipation, unspecified: Secondary | ICD-10-CM | POA: Insufficient documentation

## 2021-03-04 LAB — COMPREHENSIVE METABOLIC PANEL WITH GFR
ALT: 20 U/L (ref 0–44)
AST: 23 U/L (ref 15–41)
Albumin: 4.4 g/dL (ref 3.5–5.0)
Alkaline Phosphatase: 74 U/L (ref 38–126)
Anion gap: 6 (ref 5–15)
BUN: 10 mg/dL (ref 6–20)
CO2: 30 mmol/L (ref 22–32)
Calcium: 9.2 mg/dL (ref 8.9–10.3)
Chloride: 105 mmol/L (ref 98–111)
Creatinine, Ser: 0.9 mg/dL (ref 0.61–1.24)
GFR, Estimated: >60 mL/min
Glucose, Bld: 120 mg/dL — ABNORMAL HIGH (ref 70–99)
Potassium: 3.9 mmol/L (ref 3.5–5.1)
Sodium: 141 mmol/L (ref 135–145)
Total Bilirubin: 0.5 mg/dL (ref 0.3–1.2)
Total Protein: 7.2 g/dL (ref 6.5–8.1)

## 2021-03-04 LAB — CBC WITH DIFFERENTIAL/PLATELET
Abs Immature Granulocytes: 0 10*3/uL (ref 0.00–0.07)
Basophils Absolute: 0 10*3/uL (ref 0.0–0.1)
Basophils Relative: 1 %
Eosinophils Absolute: 0.6 10*3/uL — ABNORMAL HIGH (ref 0.0–0.5)
Eosinophils Relative: 17 %
HCT: 39.7 % (ref 39.0–52.0)
Hemoglobin: 13.6 g/dL (ref 13.0–17.0)
Immature Granulocytes: 0 %
Lymphocytes Relative: 29 %
Lymphs Abs: 1 10*3/uL (ref 0.7–4.0)
MCH: 29.6 pg (ref 26.0–34.0)
MCHC: 34.3 g/dL (ref 30.0–36.0)
MCV: 86.5 fL (ref 80.0–100.0)
Monocytes Absolute: 0.4 10*3/uL (ref 0.1–1.0)
Monocytes Relative: 10 %
Neutro Abs: 1.5 10*3/uL — ABNORMAL LOW (ref 1.7–7.7)
Neutrophils Relative %: 43 %
Platelets: 190 10*3/uL (ref 150–400)
RBC: 4.59 MIL/uL (ref 4.22–5.81)
RDW: 12.6 % (ref 11.5–15.5)
WBC: 3.5 10*3/uL — ABNORMAL LOW (ref 4.0–10.5)
nRBC: 0 % (ref 0.0–0.2)

## 2021-03-04 LAB — CEA (IN HOUSE-CHCC): CEA (CHCC-In House): 3.27 ng/mL (ref 0.00–5.00)

## 2021-03-04 NOTE — Telephone Encounter (Signed)
Scheduled follow-up appointment per 5/13 los. Patient is aware. 

## 2021-03-05 ENCOUNTER — Encounter: Payer: Self-pay | Admitting: Hematology

## 2021-03-08 NOTE — Progress Notes (Signed)
Ov note faxed to Dr Michail Sermon

## 2021-09-02 ENCOUNTER — Inpatient Hospital Stay: Payer: BC Managed Care – PPO

## 2021-09-02 ENCOUNTER — Inpatient Hospital Stay: Payer: BC Managed Care – PPO | Attending: Hematology | Admitting: Hematology

## 2021-09-02 ENCOUNTER — Other Ambulatory Visit: Payer: Self-pay

## 2021-09-02 ENCOUNTER — Encounter: Payer: Self-pay | Admitting: Hematology

## 2021-09-02 VITALS — BP 132/84 | HR 73 | Temp 98.4°F | Resp 18 | Ht 72.5 in | Wt 203.2 lb

## 2021-09-02 DIAGNOSIS — Z85048 Personal history of other malignant neoplasm of rectum, rectosigmoid junction, and anus: Secondary | ICD-10-CM | POA: Diagnosis not present

## 2021-09-02 DIAGNOSIS — R109 Unspecified abdominal pain: Secondary | ICD-10-CM | POA: Diagnosis not present

## 2021-09-02 DIAGNOSIS — C20 Malignant neoplasm of rectum: Secondary | ICD-10-CM

## 2021-09-02 LAB — CBC WITH DIFFERENTIAL/PLATELET
Abs Immature Granulocytes: 0 10*3/uL (ref 0.00–0.07)
Basophils Absolute: 0 10*3/uL (ref 0.0–0.1)
Basophils Relative: 1 %
Eosinophils Absolute: 0.5 10*3/uL (ref 0.0–0.5)
Eosinophils Relative: 15 %
HCT: 39.7 % (ref 39.0–52.0)
Hemoglobin: 13.5 g/dL (ref 13.0–17.0)
Immature Granulocytes: 0 %
Lymphocytes Relative: 28 %
Lymphs Abs: 0.9 10*3/uL (ref 0.7–4.0)
MCH: 29.5 pg (ref 26.0–34.0)
MCHC: 34 g/dL (ref 30.0–36.0)
MCV: 86.9 fL (ref 80.0–100.0)
Monocytes Absolute: 0.3 10*3/uL (ref 0.1–1.0)
Monocytes Relative: 9 %
Neutro Abs: 1.6 10*3/uL — ABNORMAL LOW (ref 1.7–7.7)
Neutrophils Relative %: 47 %
Platelets: 184 10*3/uL (ref 150–400)
RBC: 4.57 MIL/uL (ref 4.22–5.81)
RDW: 12.5 % (ref 11.5–15.5)
WBC: 3.3 10*3/uL — ABNORMAL LOW (ref 4.0–10.5)
nRBC: 0 % (ref 0.0–0.2)

## 2021-09-02 LAB — COMPREHENSIVE METABOLIC PANEL
ALT: 27 U/L (ref 0–44)
AST: 27 U/L (ref 15–41)
Albumin: 4.3 g/dL (ref 3.5–5.0)
Alkaline Phosphatase: 78 U/L (ref 38–126)
Anion gap: 6 (ref 5–15)
BUN: 9 mg/dL (ref 6–20)
CO2: 29 mmol/L (ref 22–32)
Calcium: 8.9 mg/dL (ref 8.9–10.3)
Chloride: 106 mmol/L (ref 98–111)
Creatinine, Ser: 0.84 mg/dL (ref 0.61–1.24)
GFR, Estimated: >60 mL/min (ref >60)
Glucose, Bld: 111 mg/dL — ABNORMAL HIGH (ref 70–99)
Potassium: 4.1 mmol/L (ref 3.5–5.1)
Sodium: 141 mmol/L (ref 135–145)
Total Bilirubin: 0.7 mg/dL (ref 0.3–1.2)
Total Protein: 7.2 g/dL (ref 6.5–8.1)

## 2021-09-02 LAB — CEA (IN HOUSE-CHCC): CEA (CHCC-In House): 3.03 ng/mL (ref 0.00–5.00)

## 2021-09-02 NOTE — Progress Notes (Signed)
Per Dr. Ernestina Penna request, contacted Dr. Wilford Corner w/[facility] regarding appt needed for colonoscopy before end of the year.  Spoke with Gatha Mayer with [facility] [phone]) and she stated they were booked for the remainder of the year but she would forward message to Dr. Kathline Magic nurse for her to speak with Dr. Michail Sermon.  Faxed over Dr. Ernestina Penna last office notes on patient and requested if Dr. Michail Sermon [phone]) contact Dr. Burr Medico for further discussion.

## 2021-09-02 NOTE — Progress Notes (Addendum)
[place]   [phone] [phone]   Clinic Follow up Note   Patient Care Team: Eulas Post, MD as PCP - General (Family Medicine) Arta Silence, MD as Consulting Physician (Gastroenterology) Michael Boston, MD as Consulting Physician (General Surgery) Kyung Rudd, MD as Consulting Physician (Radiation Oncology) Truitt Merle, MD as Consulting Physician (Oncology)  Date of Service:  09/02/2021  CHIEF COMPLAINT: f/u of rectal cancer  CURRENT THERAPY:  Surveillance  ASSESSMENT & PLAN:  Paul [name] is a 55 y.o. male with   1. Poorly differentiated invasive rectal adenocarcinoma, cT3N1M0, stage IIIb, ypT0N0 -He was diagnosed in 06/2017. He is s/p concurrent chemoradiation with Xeloda, Surgical resection and 5 cycles of adjuvant Xeloda. He is currently on surveillance.  -He underwent surveillance colonoscopy on 10/11/18 through with Dr Michail Sermon. Will repeat in 09/2021. I will reach out to Dr. Michail Sermon to assist in getting this scheduled. -most recent CT CAP 08/18/20 was NED. -He is clinically doing well. Labs reviewed, overall no concern. Physical exam unremarkable. Pt is concerned about his recent intermittent left side abdominal pain, although he has not highly suspicious for cancer recurrence, I will obtain a CT abdomen pelvis for evaluation. -He is due for repeat colonoscopy, I will send a message to Dr. Michail Sermon.  -He is over 4 years since his cancer diagnosis. His risk of recurrence has significantly decreased. I plan to see him back once more to complete 5 years of surveillance.   2. Loose stool, left abdominal pain -His BM lately are loose, but he is able to control it. -he reports new left-sided abdominal pain that is mild, intermittent, and resolves on its own. -I will order CT scan to be done soon to rule out any disease.  3. Genetics Negative for pathogenetic mutations      4. HTN, Cancer screening -Continue follow-up with PCP   -I recommend he continue age appropriate cancer screenings    PLAN: -I ordered CT scan to be done in near future due to his abdominal pain, will call him with results  -Colonoscopy due in 09/2021. I will reach out to Dr Michail Sermon.  -Lab and F/u in 1 year (last visit)    No problem-specific Assessment & Plan notes found for this encounter.   SUMMARY OF ONCOLOGIC HISTORY: Oncology History Overview Note  Cancer Staging Rectal adenocarcinoma [facility]) Staging form: Colon and Rectum, AJCC 8th Edition - Clinical stage from 07/06/2017: Stage IIIB (cT3, cN1, cM0) - Signed by [name], NP on 07/10/2017    Rectal adenocarcinoma s/p robotic LAR resection 10/10/2017  07/03/2017 Imaging   CT ABD PELVIS W CONTRAST IMPRESSION: Possible mass in the rectum compatible carcinoma. Endoscopy recommended for further evaluation. Otherwise negative.   07/03/2017 Tumor Marker   CEA 2.7   07/05/2017 Initial Biopsy   Rectum biopsy -invasive adenocarcinoma, poorly differentiated   07/05/2017 Procedure   Colonoscopy Per Dr. Paulita Fujita Findings: The perianal and digital rectal examinations were normal.  Internal hemorrhoids were found during retroflexion. The hemorrhoids were moderate. No additional abnormalities were found on retroflexion.  A fungating, sessile and ulcerated partially obstructing large mass was found in the recto-sigmoid colon.The mass was partially circumferential (involving two-thirds of the lumen circumference). Oozing was present. Area was successfully injected with 2 mL [place] ink for tattooing. This was biopsied with a cold forceps for histology.   07/05/2017 Initial Diagnosis   Rectal adenocarcinoma ([place])   07/06/2017 Imaging   Staging MRI ABD/PELVIS revealed clinical stage T3N1M0, IIIb  07/18/2017 - 08/24/2017 Radiation Therapy   Radaition with Dr Lisbeth Renshaw     Radiation treatment dates:   07/18/2017 - 08/24/2017   Site/dose:   The rectum was treated to 45 Gy in 25 fractions of  1.8 Gy, followed by a 5.4 Gy boost in 3 fractions to yield a total dose of 50.4 Gy.   Narrative: The patient tolerated radiation treatment relatively well.   He had rectal bleeding with clots that was bright red after bowel movements. He reported loose stools that were painful. The skin to the radiation site was irritated, but no skin breakage was noted.    07/18/2017 Imaging    CT CHEST  IMPRESSION: Negative. No evidence of thoracic metastatic disease or other significant abnormality.     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  The Hereditary Gene Panel offered by [facility] includes sequencing and/or deletion duplication testing of the following 47 genes: APC, ATM, AXIN2, BARD1, BMPR1A, BRCA1, BRCA2, BRIP1, CDH1, CDK4, CDKN2A (p14ARF), CDKN2A (p16INK4a), CHEK2, CTNNA1, DICER1, EPCAM (Deletion/duplication testing only), GREM1 (promoter region deletion/duplication testing only), KIT, MEN1, MLH1, MSH2, MSH3, MSH6, MUTYH, NBN, NF1, NHTL1, PALB2, PDGFRA, PMS2, POLD1, POLE, PTEN, RAD50, RAD51C, RAD51D, SDHB, SDHC, SDHD, SMAD4, SMARCA4. STK11, TP53, TSC1, TSC2, and VHL.  The following genes were evaluated for sequence changes only: SDHA and HOXB13 c.251G>A variant only.  The report date is August 27, 2017.    10/10/2017 Surgery   XI ROBOTIC ASSISTED LOWER ANTERIOR RESECTION AND RIGID PROCTOSCOPY bu Dr. GrJohney Mainend Dr. WhDema Severin12/19/18    10/10/2017 Pathology Results       Diagnosis 10/10/17  1. Colon, segmental resection for tumor, sigmoid SMALL FOCUS OF COLONIC GLANDS WITH HIGH GRADE DYSPLASIA POST NEOADJUVANT CHEMORADIATION THERAPY BIOPSY SITE WITH CHRONIC INFLAMMATION NO RESIDULE INVASIVE CARCINOMA PRESENT TWENTY-TWO BENIGN LYMPH NODES (0/22) 2. Colon, resection margin (donut), final distal BENIGN COLONIC  TISSUE   11/19/2017 - 03/04/2018 Adjuvant Chemotherapy   He started adjuvant 2000 mg Xeloda BID 2 weeks on 1 week off on 11/19/17 and completed 5 cycles on 03/04/18   04/08/2018 Imaging   CT AP W Contrast 04/08/18 IMPRESSION: 1. Postoperative and post treatment related changes in the low anatomic pelvis, as above. Today's study will serve as a new baseline for future follow-up examinations. At this time, there is no definitive evidence to strongly suggest local recurrence of disease or definite metastatic disease in the abdomen or pelvis. 2. Mild pancreatic ductal dilatation. This is of uncertain etiology and significance. No obstructing lesion identified in the pancreatic head. Correlation with nonemergent MRI of the abdomen with and without IV gadolinium with MRCP is suggested in the near future to evaluate for the etiology of this dilatation.     10/21/2018 Imaging   CT CAP W Contrast 10/21/18 IMPRESSION: Status post low anterior resection with suspected radiation changes in the presacral space. No evidence of recurrent or metastatic disease.   10/03/2019 Imaging   CT CAP W Contast  IMPRESSION: 1. No findings of recurrent malignancy. 2. Abnormal prominence of the dorsal pancreatic duct in the pancreatic body and head, measuring up to 6 mm in diameter, similar to prior, cause uncertain. 3. Wall thickening in the lower rectum below the level of the anastomotic staple line, probably therapy related. 4. Stable presacral soft tissue density, likely therapy related. 5.  Mild degenerative disc disease at L4-5 and L5-S1.       08/18/2020 Imaging   CT CAP  IMPRESSION: 1. Redemonstrated postoperative findings of low anterior resection and reanastomosis with unchanged rectal thickening and presacral soft tissue thickening. No evidence of locally recurrent or metastatic disease in the chest, abdomen, or pelvis. 2. Prominence of the pancreatic duct is decreased compared to  prior examination, generally remains of uncertain significance. Attention on follow-up.      INTERVAL HISTORY:  Paul [name] is here for a follow up of rectal cancer. He was last seen by me on 03/04/21. He presents to the clinic alone. He reports a new left-sided abdominal pain that is intermittent and "light." He notes his bowel movements are still not regular and are loose. He denies accidents. He reports he is due for colonoscopy in 09/2021, but he was told they are booked until next year. He notes this will be difficult for him given his insurance.   All other systems were reviewed with the patient and are negative.  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    SURGICAL HISTORY: Past Surgical History:  Procedure Laterality Date   COLONOSCOPY WITH PROPOFOL Left 07/05/2017   Procedure: COLONOSCOPY WITH PROPOFOL;  Surgeon: Arta Silence, MD;  Location: [facility];  Service: Endoscopy;  Laterality: Left;   PROCTOSCOPY N/A 10/10/2017   Procedure: RIGID PROCTOSCOPY;  Surgeon: Michael Boston, MD;  Location: [facility];  Service: General;  Laterality: N/A;   XI ROBOTIC ASSISTED LOWER ANTERIOR RESECTION N/A 10/10/2017   Procedure: XI ROBOTIC ASSISTED LOWER ANTERIOR RESECTION;  Surgeon: Michael Boston, MD;  Location: [facility];  Service: General;  Laterality: N/A;    I have reviewed the social history and family history with the patient and they are unchanged from previous note.  ALLERGIES:  is allergic to chloroquine.  MEDICATIONS:  Current Outpatient Medications  Medication Sig Dispense Refill   amLODipine (NORVASC) 5 MG tablet TAKE 1 TABLET DAILY 90 tablet 3    cetirizine (ZYRTEC) 10 MG tablet Take 10 mg by mouth daily as needed for allergies.     GARLIC PO Take 1 capsule by mouth daily.      Multiple Vitamin (MULTIVITAMIN WITH MINERALS) TABS tablet Take 1 tablet by mouth daily.     naproxen (NAPROSYN) 500 MG tablet Take 1 tablet (500 mg total) by mouth every 12 (twelve) hours as needed for mild pain or moderate pain. 30 tablet 1   omeprazole (PRILOSEC) 40 MG capsule Take 40 mg by mouth daily as needed (heart burn).      No current facility-administered medications for this visit.    PHYSICAL EXAMINATION: ECOG PERFORMANCE STATUS: 0 - Asymptomatic  Vitals:   09/02/21 1101  BP: 132/84  Pulse: 73  Resp: 18  Temp: 98.4 F (36.9 C)  SpO2: 100%   Wt Readings from Last 3 Encounters:  09/02/21 203 lb 3.2 oz (92.2 kg)  03/04/21 200 lb (90.7 kg)  10/04/20 200 lb 6.1 oz (90.9 kg)     GENERAL:alert, no distress and comfortable SKIN: skin color, texture, turgor are normal, no rashes or significant lesions EYES: normal, Conjunctiva are pink and non-injected,  sclera clear  NECK: supple, thyroid normal size, non-tender, without nodularity LYMPH:  no palpable lymphadenopathy in the cervical, axillary  LUNGS: clear to auscultation and percussion with normal breathing effort HEART: regular rate & rhythm and no murmurs and no lower extremity edema ABDOMEN:abdomen soft, non-tender and normal bowel sounds Musculoskeletal:no cyanosis of digits and no clubbing  NEURO: alert & oriented x 3 with fluent speech, no focal motor/sensory deficits RECTAL: No palpable mass. No blood on glove. Normal stool present. Benign exam    LABORATORY DATA:  I have reviewed the data as listed CBC Latest Ref Rng & Units 09/02/2021 03/04/2021 10/04/2020  WBC 4.0 - 10.5 K/uL 3.3(L) 3.5(L) 3.1(L)  Hemoglobin 13.0 - 17.0 g/dL 13.5 13.6 14.9  Hematocrit 39.0 - 52.0 % 39.7 39.7 43.0  Platelets 150 - 400 K/uL 184 190 198     CMP Latest Ref Rng & Units 09/02/2021 03/04/2021  10/04/2020  Glucose 70 - 99 mg/dL 111(H) 120(H) 96  BUN 6 - 20 mg/dL _0 Creatinine 0.61 - 1.24 mg/dL 0.84 0.90 0.82  Sodium 135 - 145 mmol/L 141 141 143  Potassium 3.5 - 5.1 mmol/L 4.1 3.9 4.6  Chloride 98 - 111 mmol/L 106 105 104  CO2 22 - 32 mmol/L _1 Calcium 8.9 - 10.3 mg/dL 8.9 9.2 9.7  Total Protein 6.5 - 8.1 g/dL 7.2 7.2 7.7  Total Bilirubin 0.3 - 1.2 mg/dL 0.7 0.5 0.6  Alkaline Phos 38 - 126 U/L 78 74 -  AST 15 - 41 U/L _2 ALT 0 - 44 U/L _3 RADIOGRAPHIC STUDIES: I have personally reviewed the radiological images as listed and agreed with the findings in the report. No results found.    Orders Placed This Encounter  Procedures   CT ABDOMEN PELVIS W CONTRAST    Standing Status:   Future    Standing Expiration Date:   09/02/2022    Order Specific Question:   If indicated for the ordered procedure, I authorize the administration of contrast media per Radiology protocol    Answer:   Yes    Order Specific Question:   Preferred imaging location?    Answer:   [facility]    Order Specific Question:   Release to patient    Answer:   Immediate    Order Specific Question:   Is Oral Contrast requested for this exam?    Answer:   Yes, Per Radiology protocol   All questions were answered. The patient knows to call the clinic with any problems, questions or concerns. No barriers to learning was detected. The total time spent in the appointment was 30 minutes.     Truitt Merle, MD 09/02/2021   I, Wilburn Mylar, am acting as scribe for Truitt Merle, MD.   I have reviewed the above documentation for accuracy and completeness, and I agree with the above.

## 2021-09-16 ENCOUNTER — Other Ambulatory Visit: Payer: Self-pay

## 2021-09-16 ENCOUNTER — Ambulatory Visit (HOSPITAL_COMMUNITY)
Admission: RE | Admit: 2021-09-16 | Discharge: 2021-09-16 | Disposition: A | Discharge status: Home / Self Care | Payer: BC Managed Care – PPO | Source: Ambulatory Visit | Attending: Hematology | Admitting: Hematology

## 2021-09-16 ENCOUNTER — Encounter (HOSPITAL_COMMUNITY): Payer: Self-pay

## 2021-09-16 DIAGNOSIS — C20 Malignant neoplasm of rectum: Secondary | ICD-10-CM | POA: Diagnosis not present

## 2021-09-16 DIAGNOSIS — N281 Cyst of kidney, acquired: Secondary | ICD-10-CM | POA: Diagnosis not present

## 2021-09-16 DIAGNOSIS — K6389 Other specified diseases of intestine: Secondary | ICD-10-CM | POA: Diagnosis not present

## 2021-09-16 MED ORDER — SODIUM CHLORIDE (PF) 0.9 % IJ SOLN
INTRAMUSCULAR | Status: AC
  Filled 2021-09-16: qty 50

## 2021-09-16 MED ORDER — IOHEXOL 350 MG/ML SOLN
80.0000 mL | Freq: Once | INTRAVENOUS | Status: AC | PRN
  Administered 2021-09-16: 80 mL via INTRAVENOUS

## 2021-09-27 ENCOUNTER — Encounter: Payer: Self-pay | Admitting: Family Medicine

## 2021-09-27 ENCOUNTER — Telehealth (INDEPENDENT_AMBULATORY_CARE_PROVIDER_SITE_OTHER): Payer: BC Managed Care – PPO | Admitting: Family Medicine

## 2021-09-27 DIAGNOSIS — R059 Cough, unspecified: Secondary | ICD-10-CM | POA: Diagnosis not present

## 2021-09-27 MED ORDER — BENZONATATE 100 MG PO CAPS: ORAL_CAPSULE | ORAL | 0 refills | Status: DC

## 2021-09-27 MED ORDER — DOXYCYCLINE HYCLATE 100 MG PO TABS: 100.0000 mg | ORAL_TABLET | Freq: Two times a day (BID) | ORAL | 0 refills | Status: DC

## 2021-09-27 NOTE — Patient Instructions (Addendum)
   ---------------------------------------------------------------------------------------------------------------------------      WORK SLIP:  Patient Paul [name],  1965/12/15, was seen for a medical visit today, 09/27/21 . Please excuse from work for a COVID/flu like illness. If Covid19 testing is positive patient will likely be contagious for 7-10 days days minimum from the onset of symptoms (09/23/21) PLUS 1 day of no fever and improved symptoms. Will defer to employer for a sooner return to work if patient has 2 negative covid tests 48 hours apart and is feeling better, or if symptoms have resolved, it is greater than 5 days since the positive test and the patient can wear a high-quality, tight fitting mask such as N95 or KN95 at all times for an additional 5 days.   Sincerely: E-signature: Dr. Colin Benton, DO [facility] [place] Ph: [phone]   ------------------------------------------------------------------------------------------------------------------------------   -I sent the medication(s) we discussed to your pharmacy: Meds ordered this encounter  Medications   benzonatate (TESSALON PERLES) 100 MG capsule    Sig: 1-2 capsules up to twice daily as needed for cough    Dispense:  30 capsule    Refill:  0   doxycycline (VIBRA-TABS) 100 MG tablet    Sig: Take 1 tablet (100 mg total) by mouth 2 (two) times daily.    Dispense:  20 tablet    Refill:  0     I hope you are feeling better soon!  Seek in person care promptly if your symptoms worsen, new concerns arise or you are not improving with treatment.  It was nice to meet you today. I help [facility] out with telemedicine visits on Tuesdays and Thursdays and am available for visits on those days. If you have any concerns or questions following this visit please schedule a follow up visit with your Primary Care doctor or seek care at a local urgent care clinic to avoid delays in care.

## 2021-09-27 NOTE — Progress Notes (Signed)
Virtual Visit via Video Note  I connected with Mamaduo  on 09/27/21 at  5:20 PM EST by a video enabled telemedicine application and verified that I am speaking with the correct person using two identifiers.  Location patient: home, [place] Location provider:work or home office Persons participating in the virtual visit: patient, provider  I discussed the limitations of evaluation and management by telemedicine and the availability of in person appointments. The patient expressed understanding and agreed to proceed.   HPI:  Acute telemedicine visit for a cough: -Onset: about 10 days ago with a cold (sore throat, nasal congestion cough, body aches initially) but worsening the last 4-5 days -home covid testing was negative -Symptoms include: nasal congestion, cough, thick drainage in the throat makes him cough -Denies:fever, CP, SOB, NVD, inability to eat/drink -Pertinent past medical history:see below -Pertinent medication allergies:  Allergies  Allergen Reactions   Chloroquine Itching  -COVID-19 vaccine status:  Immunization History  Administered Date(s) Administered   Influenza Split 09/09/2011   Influenza,inj,Quad PF,6+ Mos 08/14/2013, 06/26/2014, 10/01/2015, 09/29/2016, 06/27/2017, 07/24/2018, 07/03/2019   Influenza-Unspecified 09/02/2020, 08/19/2021   Meningococcal Conjugate 11/08/2004   PFIZER(Purple Top)SARS-COV-2 Vaccination 02/11/2020, 02/29/2020, 09/02/2020   Pfizer Covid-19 Vaccine Bivalent Booster 75yrs & up 08/19/2021   Td 11/08/2004   Tdap 10/01/2015    ROS: See pertinent positives and negatives per HPI.  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    Past  Surgical History:  Procedure Laterality Date   COLONOSCOPY WITH PROPOFOL Left 07/05/2017   Procedure: COLONOSCOPY WITH PROPOFOL;  Surgeon: Arta Silence, MD;  Location: [facility];  Service: Endoscopy;  Laterality: Left;   PROCTOSCOPY N/A 10/10/2017   Procedure: RIGID PROCTOSCOPY;  Surgeon: Michael Boston, MD;  Location: [facility];  Service: General;  Laterality: N/A;   XI ROBOTIC ASSISTED LOWER ANTERIOR RESECTION N/A 10/10/2017   Procedure: XI ROBOTIC ASSISTED LOWER ANTERIOR RESECTION;  Surgeon: Michael Boston, MD;  Location: [facility];  Service: General;  Laterality: N/A;     Current Outpatient Medications:    amLODipine (NORVASC) 5 MG tablet, TAKE 1 TABLET DAILY, Disp: 90 tablet, Rfl: 3   benzonatate (TESSALON PERLES) 100 MG capsule, 1-2 capsules up to twice daily as needed for cough, Disp: 30 capsule, Rfl: 0   cetirizine (ZYRTEC) 10 MG tablet, Take 10 mg by mouth daily as needed for allergies., Disp: , Rfl:    doxycycline (VIBRA-TABS) 100 MG tablet, Take 1 tablet (100 mg total) by mouth 2 (two) times daily., Disp: 20 tablet, Rfl: 0   Multiple Vitamin (MULTIVITAMIN WITH MINERALS) TABS tablet, Take 1 tablet by mouth daily., Disp: , Rfl:    naproxen (NAPROSYN) 500 MG tablet, Take 1 tablet (500 mg total) by mouth every 12 (twelve) hours as needed for mild pain or moderate pain., Disp: 30 tablet, Rfl: 1   omeprazole (PRILOSEC) 40 MG capsule, Take 40 mg by mouth daily as needed (heart burn). , Disp: , Rfl:   EXAM:  VITALS per patient if applicable:  GENERAL: alert, oriented, appears well and in no acute distress  HEENT: atraumatic, conjunttiva clear, no obvious abnormalities on inspection of external nose and ears  NECK: normal movements of the head and neck  LUNGS: on inspection no signs of respiratory distress, breathing rate appears normal, no obvious gross SOB, gasping or wheezing  CV: no obvious cyanosis  MS: moves all visible extremities without noticeable abnormality  PSYCH/NEURO:  pleasant and cooperative, no obvious depression or anxiety, speech and thought processing grossly intact  ASSESSMENT AND PLAN:  Discussed the following assessment and plan:  Cough, unspecified type  -we discussed possible serious and likely etiologies, options for evaluation and workup, limitations of telemedicine visit vs in person visit, treatment, treatment risks and precautions. Pt is agreeable to treatment via telemedicine at this moment. Query viral ongoing symptoms vs developing bacterial resp illness vs other. He has opted to try cough medication, addition of doxy 100mg  bid x 7-10 days if not improving.  Work/School slipped offered: provided in patient instructions   Advised to seek prompt in person care if worsening, new symptoms arise, or if is not improving with treatment. Discussed options for inperson care if PCP office not available. Did let this patient know that I do telemedicine on Tuesdays and Thursdays for [place].    I discussed the assessment and treatment plan with the patient. The patient was provided an opportunity to ask questions and all were answered. The patient agreed with the plan and demonstrated an understanding of the instructions.     Lucretia Kern, DO

## 2021-09-28 ENCOUNTER — Encounter: Payer: Self-pay | Admitting: Family Medicine

## 2021-09-28 ENCOUNTER — Telehealth: Payer: Self-pay | Admitting: Family Medicine

## 2021-09-28 NOTE — Telephone Encounter (Signed)
Spoke with the patient and informed him the work slip was completed by Dr Maudie Mercury and is at the end of the office notes from 12/6.  Patient stated he is unable to see the note.  Letter was completed and sent via Mychart message to the patient.

## 2021-09-28 NOTE — Telephone Encounter (Signed)
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

## 2021-10-05 ENCOUNTER — Ambulatory Visit (INDEPENDENT_AMBULATORY_CARE_PROVIDER_SITE_OTHER): Payer: BC Managed Care – PPO | Admitting: Family Medicine

## 2021-10-05 VITALS — BP 140/60 | HR 99 | Temp 98.0°F | Wt 205.7 lb

## 2021-10-05 DIAGNOSIS — Z0000 Encounter for general adult medical examination without abnormal findings: Secondary | ICD-10-CM

## 2021-10-05 LAB — LIPID PANEL
Cholesterol: 120 mg/dL (ref 0–200)
HDL: 60.4 mg/dL (ref >39.00)
LDL Cholesterol: 36 mg/dL (ref 0–99)
NonHDL: 59.8
Total CHOL/HDL Ratio: 2
Triglycerides: 117 mg/dL (ref 0.0–149.0)
VLDL: 23.4 mg/dL (ref 0.0–40.0)

## 2021-10-05 LAB — PSA: PSA: 1.6 ng/mL (ref 0.10–4.00)

## 2021-10-05 LAB — TSH: TSH: 1.69 u[IU]/mL (ref 0.35–5.50)

## 2021-10-05 MED ORDER — AMLODIPINE BESYLATE 5 MG PO TABS: 5.0000 mg | ORAL_TABLET | Freq: Every day | ORAL | 3 refills | Status: DC

## 2021-10-05 NOTE — Patient Instructions (Signed)
Consider Shingrix vaccine at some point this year.    Consider home BP cuff and monitor and be in touch is consistently > 140/90.     [employer] brand.

## 2021-10-05 NOTE — Progress Notes (Signed)
Established Patient Office Visit  Subjective:  Patient ID: Paul [name], male    DOB: 16-Sep-1966  Age: 55 y.o. MRN: [id number]  CC:  Chief Complaint  Patient presents with   Annual Exam    HPI Paul [name] presents for physical exam.  He has history of rectal adenocarcinoma with previous robotic resection.  Followed closely by oncology.  He had recent CEA level which was stable and also had CBC and CMP which were stable.  No acute concerns with that.  He is generally doing well.  Not exercising regularly.  Walks some.  He has hypertension treated with amlodipine.  Does not monitor blood pressures at home.  No recent headaches.  Health maintenance reviewed.  -He had positive varicella IgG antibody last year but has not had Shingrix vaccine. -Other vaccines up-to-date. -He has colonoscopy repeat scheduled for this Friday  Family history and social history reviewed with no significant changes.  His children are ages 102, 8, and 23.  Patient has never smoked.  No alcohol.  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    Past Surgical History:  Procedure Laterality Date   COLONOSCOPY WITH PROPOFOL Left 07/05/2017   Procedure: COLONOSCOPY WITH PROPOFOL;  Surgeon: Arta Silence, MD;  Location: [place];  Service: Endoscopy;  Laterality: Left;   PROCTOSCOPY N/A 10/10/2017   Procedure: RIGID PROCTOSCOPY;  Surgeon: Michael Boston, MD;  Location: [facility];  Service: General;  Laterality: N/A;   XI ROBOTIC ASSISTED LOWER ANTERIOR RESECTION N/A 10/10/2017   Procedure: XI ROBOTIC ASSISTED LOWER ANTERIOR RESECTION;  Surgeon: Michael Boston, MD;   Location: [facility];  Service: General;  Laterality: N/A;    Family History  Problem Relation Age of Onset   Hypertension Mother    Asthma Father    Cancer Father        Prostate   Asthma Son    Asthma Son    Asthma Daughter    Dementia Maternal Grandmother     Social History   Socioeconomic History   Marital status: Married    Spouse name: Not on file   Number of children: Not on file   Years of education: Not on file   Highest education level: Not on file  Occupational History   Not on file  Tobacco Use   Smoking status: Never   Smokeless tobacco: Never  Vaping Use   Vaping Use: Never used  Substance and Sexual Activity   Alcohol use: No   Drug use: No   Sexual activity: Not on file  Other Topics Concern   Not on file  Social History Narrative   Pronouced "[place]--[place]"   Originally [place]   Social Determinants of Health   Financial Resource Strain: Not on file  Food Insecurity: Not on file  Transportation Needs: Not on file  Physical Activity: Not on file  Stress: Not on file  Social Connections: Not on file  Intimate Partner Violence: Not on file    Outpatient Medications Prior to Visit  Medication Sig Dispense Refill   cetirizine (ZYRTEC) 10 MG tablet Take 10 mg  by mouth daily as needed for allergies.     Multiple Vitamin (MULTIVITAMIN WITH MINERALS) TABS tablet Take 1 tablet by mouth daily.     naproxen (NAPROSYN) 500 MG tablet Take 1 tablet (500 mg total) by mouth every 12 (twelve) hours as needed for mild pain or moderate pain. 30 tablet 1   omeprazole (PRILOSEC) 40 MG capsule Take 40 mg by mouth daily as needed (heart burn).      amLODipine (NORVASC) 5 MG tablet TAKE 1 TABLET DAILY 90 tablet 3   benzonatate (TESSALON PERLES) 100 MG capsule 1-2 capsules up to twice daily as needed for cough 30 capsule 0   doxycycline (VIBRA-TABS) 100 MG tablet Take 1 tablet (100 mg total) by mouth 2 (two) times daily. 20  tablet 0   No facility-administered medications prior to visit.    Allergies  Allergen Reactions   Chloroquine Itching    ROS Review of Systems  Constitutional:  Negative for fatigue.  Eyes:  Negative for visual disturbance.  Respiratory:  Negative for cough, chest tightness and shortness of breath.   Cardiovascular:  Negative for chest pain, palpitations and leg swelling.  Neurological:  Negative for dizziness, syncope, weakness, light-headedness and headaches.     Objective:    Physical Exam Constitutional:      Appearance: He is well-developed.  HENT:     Right Ear: External ear normal.     Left Ear: External ear normal.  Eyes:     Pupils: Pupils are equal, round, and reactive to light.  Neck:     Thyroid: No thyromegaly.  Cardiovascular:     Rate and Rhythm: Normal rate and regular rhythm.  Pulmonary:     Effort: Pulmonary effort is normal. No respiratory distress.     Breath sounds: Normal breath sounds. No wheezing or rales.  Musculoskeletal:     Cervical back: Neck supple.     Right lower leg: No edema.     Left lower leg: No edema.  Neurological:     Mental Status: He is alert and oriented to person, place, and time.    BP 140/60 (BP Location: Left Arm, Patient Position: Sitting, Cuff Size: Normal)    Pulse 99    Temp 98 F (36.7 C) (Oral)    Wt 205 lb 11.2 oz (93.3 kg)    SpO2 99%    BMI 27.51 kg/m  Wt Readings from Last 3 Encounters:  10/05/21 205 lb 11.2 oz (93.3 kg)  09/02/21 203 lb 3.2 oz (92.2 kg)  03/04/21 200 lb (90.7 kg)     Health Maintenance Due  Topic Date Due   Pneumococcal Vaccine 68-37 Years old (1 - PCV) Never done   HIV Screening  Never done   Zoster Vaccines- Shingrix (1 of 2) Never done    There are no preventive care reminders to display for this patient.  Lab Results  Component Value Date   TSH 1.54 10/04/2020   Lab Results  Component Value Date   WBC 3.3 (L) 09/02/2021   HGB 13.5 09/02/2021   HCT 39.7 09/02/2021    MCV 86.9 09/02/2021   PLT 184 09/02/2021   Lab Results  Component Value Date   NA 141 09/02/2021   K 4.1 09/02/2021   CHLORIDE 104 10/25/2017   CO2 29 09/02/2021   GLUCOSE 111 (H) 09/02/2021   BUN 9 09/02/2021   CREATININE 0.84 09/02/2021   BILITOT 0.7 09/02/2021   ALKPHOS 78 09/02/2021   AST 27 09/02/2021  ALT 27 09/02/2021   PROT 7.2 09/02/2021   ALBUMIN 4.3 09/02/2021   CALCIUM 8.9 09/02/2021   ANIONGAP 6 09/02/2021   EGFR >60 10/25/2017   GFR 122.84 10/19/2017   Lab Results  Component Value Date   CHOL 127 10/04/2020   Lab Results  Component Value Date   HDL 65 10/04/2020   Lab Results  Component Value Date   LDLCALC 44 10/04/2020   Lab Results  Component Value Date   TRIG 93 10/04/2020   Lab Results  Component Value Date   CHOLHDL 2.0 10/04/2020   Lab Results  Component Value Date   HGBA1C 5.4 10/08/2017      Assessment & Plan:   Problem List Items Addressed This Visit   None Visit Diagnoses     Physical exam    -  Primary   Relevant Orders   Lipid panel   TSH   PSA     -Obtain labs as above.  We elected not to get CBC and CMP because he had these recently through oncology.  -Discussed Shingrix vaccine and he will consider after checking on insurance coverage  -Advised to get home blood pressure cuff and monitor closely at home.  If consistently greater than 140/90 be in touch  -Try to establish more consistent exercise  -Continue with annual flu vaccine  Meds ordered this encounter  Medications   amLODipine (NORVASC) 5 MG tablet    Sig: Take 1 tablet (5 mg total) by mouth daily.    Dispense:  90 tablet    Refill:  3    Follow-up: No follow-ups on file.    Carolann Littler, MD

## 2021-10-07 DIAGNOSIS — Z85048 Personal history of other malignant neoplasm of rectum, rectosigmoid junction, and anus: Secondary | ICD-10-CM | POA: Diagnosis not present

## 2021-10-07 DIAGNOSIS — D122 Benign neoplasm of ascending colon: Secondary | ICD-10-CM | POA: Diagnosis not present

## 2021-10-07 DIAGNOSIS — Z980 Intestinal bypass and anastomosis status: Secondary | ICD-10-CM | POA: Diagnosis not present

## 2021-11-01 DIAGNOSIS — T300 Burn of unspecified body region, unspecified degree: Secondary | ICD-10-CM | POA: Diagnosis not present

## 2021-11-01 DIAGNOSIS — I10 Essential (primary) hypertension: Secondary | ICD-10-CM | POA: Diagnosis not present

## 2021-12-20 ENCOUNTER — Encounter: Payer: Self-pay | Admitting: Family Medicine

## 2022-04-10 ENCOUNTER — Encounter: Payer: Self-pay | Admitting: *Deleted

## 2022-04-10 ENCOUNTER — Encounter: Payer: Self-pay | Admitting: Family Medicine

## 2022-04-10 ENCOUNTER — Telehealth: Payer: Self-pay | Admitting: *Deleted

## 2022-04-10 ENCOUNTER — Ambulatory Visit (INDEPENDENT_AMBULATORY_CARE_PROVIDER_SITE_OTHER): Payer: BC Managed Care – PPO | Admitting: Family Medicine

## 2022-04-10 VITALS — BP 160/88 | HR 85 | Temp 98.4°F | Ht 72.05 in | Wt 208.8 lb

## 2022-04-10 DIAGNOSIS — C20 Malignant neoplasm of rectum: Secondary | ICD-10-CM | POA: Diagnosis not present

## 2022-04-10 DIAGNOSIS — I10 Essential (primary) hypertension: Secondary | ICD-10-CM | POA: Diagnosis not present

## 2022-04-10 NOTE — Patient Instructions (Signed)
Acquire home BP cuff and monitor BP and be in touch if consistently > 140/90.    Try to keep daily sodium intake < 2,500 mg

## 2022-04-10 NOTE — Progress Notes (Signed)
Established Patient Office Visit  Subjective   Patient ID: Paul [name], male    DOB: 02/05/1966  Age: 56 y.o. MRN: [id number]  Chief Complaint  Patient presents with   Letter for medical condition    HPI   History of rectal adenocarcinoma 2018, hypertension, GERD.  He is seen today basically requesting a letter for his workplace.  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  He is basically needing a letter to support the fact that he may need more frequent restroom breaks.  He has hypertension history currently treated with amlodipine 5 mg daily.  Has been fairly well controlled in the past.  Not monitoring currently at home.  Limited exercise recently.  No headaches.  No peripheral edema issues.  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   Past Surgical History:  Procedure Laterality Date   COLONOSCOPY WITH PROPOFOL Left 07/05/2017   Procedure: COLONOSCOPY WITH PROPOFOL;  Surgeon: Arta Silence, MD;  Location: [facility];  Service: Endoscopy;  Laterality: Left;   PROCTOSCOPY N/A 10/10/2017   Procedure: RIGID PROCTOSCOPY;  Surgeon: Michael Boston, MD;  Location: [facility];  Service: General;  Laterality: N/A;   XI ROBOTIC ASSISTED LOWER ANTERIOR RESECTION N/A 10/10/2017   Procedure: XI ROBOTIC ASSISTED LOWER ANTERIOR RESECTION;  Surgeon: Michael Boston, MD;  Location: [facility];  Service: General;  Laterality: N/A;    reports that he has never smoked. He has never used smokeless tobacco. He reports that he does not drink alcohol and does not use drugs. family history includes Asthma in his  daughter, father, son, and son; Cancer in his father; Dementia in his maternal grandmother; Hypertension in his mother. Allergies  Allergen Reactions   Chloroquine Itching    Review of Systems  Constitutional:  Negative for malaise/fatigue.  Eyes:  Negative for blurred vision.  Respiratory:  Negative for shortness of breath.   Cardiovascular:  Negative for chest pain.  Neurological:  Negative for dizziness, weakness and headaches.      Objective:     BP (!) 160/88 (BP Location: Left Arm, Cuff Size: Normal)   Pulse 85   Temp 98.4 F (36.9 C) (Oral)   Ht 6' 0.05" (1.83 m)   Wt 208 lb 12.8 oz (94.7 kg)   SpO2 99%   BMI 28.28 kg/m  BP Readings from Last 3 Encounters:  04/10/22 (!) 160/88  10/05/21 140/60  09/02/21 132/84   Wt Readings from Last 3 Encounters:  04/10/22 208 lb 12.8 oz (94.7 kg)  10/05/21 205 lb 11.2 oz (93.3 kg)  09/02/21 203 lb 3.2 oz (92.2 kg)      Physical Exam Vitals reviewed.  Constitutional:      Appearance: Normal appearance.  Cardiovascular:     Rate and Rhythm: Normal rate and regular rhythm.  Pulmonary:     Effort: Pulmonary effort is normal.     Breath sounds: Normal breath sounds.  Musculoskeletal:     Right lower leg: No edema.     Left lower leg: No edema.  Neurological:     Mental Status: He is alert.  No results found for any visits on 04/10/22.    The ASCVD Risk score (Arnett DK, et al., 2019) failed to calculate for the following reasons:   The valid total cholesterol range is 130 to 320 mg/dL   Unable to determine if patient is Non-Hispanic African American    Assessment & Plan:   #1 history of rectal cancer.  He has had history since his surgery of occasional mild fecal incontinence and stool frequency.  Letter written for his workplace to allow him to take more frequent restroom breaks as needed  #2 hypertension.  Poorly controlled but today's reading.  Strongly suggest to get home cuff and obtain several  readings.  If consistently greater than 140/90 be in touch.  Keep daily sodium less than 2500 mg.  Establish consistent/regular aerobic exercise  No follow-ups on file.    Carolann Littler, MD

## 2022-04-10 NOTE — Telephone Encounter (Signed)
Pt states he needs a letter for work stating due to his surgical history he is prone to irregular bowel movments.  Letter completed

## 2022-05-22 ENCOUNTER — Telehealth: Payer: Self-pay | Admitting: Family Medicine

## 2022-05-22 NOTE — Telephone Encounter (Signed)
Pt is returning mykal call and I do no see any info

## 2022-05-22 NOTE — Telephone Encounter (Signed)
Left a message for the pt to return my call.  

## 2022-05-23 NOTE — Telephone Encounter (Signed)
OV scheduled to discuss Work accomodation.

## 2022-05-24 ENCOUNTER — Telehealth (INDEPENDENT_AMBULATORY_CARE_PROVIDER_SITE_OTHER): Payer: BC Managed Care – PPO | Admitting: Family Medicine

## 2022-05-24 ENCOUNTER — Encounter: Payer: Self-pay | Admitting: Family Medicine

## 2022-05-24 VITALS — Ht 72.01 in | Wt 208.8 lb

## 2022-05-24 DIAGNOSIS — R197 Diarrhea, unspecified: Secondary | ICD-10-CM

## 2022-05-24 DIAGNOSIS — C20 Malignant neoplasm of rectum: Secondary | ICD-10-CM

## 2022-05-24 NOTE — Progress Notes (Signed)
Patient ID: Paul [name], male   DOB: Jul 28, 1966, 56 y.o.   MRN: [id number]  Virtual Visit via Video Note  I connected withNAME@ on 05/24/22 at  7:30 AM EDT by a video enabled telemedicine application and verified that I am speaking with the correct person using two identifiers.  Location patient: home Location provider:work or home office Persons participating in the virtual visit: patient, provider  I discussed the limitations of evaluation and management by telemedicine and the availability of in person appointments. The patient expressed understanding and agreed to proceed.   HPI:  Patient has history of colon cancer with partial colectomy.  Because of his prior treatment he has sometimes frequent loose stools.  He had recently requested letter justifying more frequent restroom breaks which by his current company policy or very limited.  We had produced a letter and then he was told he needed to have FMLA papers completed to get coverage.  He states that his frequency of stools varies tremendously.  Sometimes has none and sometimes can have loose stools several times per day.  He basically just needs justification for taking more frequent restroom breaks that are currently allowed by his current policy.  He has had regular ongoing follow-up through oncology with no evidence for cancer recurrence.  No recent bloody stools.  Appetite and weight stable.  ROS: See pertinent positives and negatives per HPI.  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    Past Surgical History:  Procedure Laterality Date   COLONOSCOPY WITH PROPOFOL Left 07/05/2017   Procedure: COLONOSCOPY WITH PROPOFOL;   Surgeon: Arta Silence, MD;  Location: [place];  Service: Endoscopy;  Laterality: Left;   PROCTOSCOPY N/A 10/10/2017   Procedure: RIGID PROCTOSCOPY;  Surgeon: Michael Boston, MD;  Location: [facility];  Service: General;  Laterality: N/A;   XI ROBOTIC ASSISTED LOWER ANTERIOR RESECTION N/A 10/10/2017   Procedure: XI ROBOTIC ASSISTED LOWER ANTERIOR RESECTION;  Surgeon: Michael Boston, MD;  Location[facility];  Service: General;  Laterality: N/A;    Family History  Problem Relation Age of Onset   Hypertension Mother    Asthma Father    Cancer Father        Prostate   Asthma Son    Asthma Son    Asthma Daughter    Dementia Maternal Grandmother     SOCIAL HX: Non-smoker   Current Outpatient Medications:    amLODipine (NORVASC) 5 MG tablet, Take 1 tablet (5 mg total) by mouth daily., Disp: 90 tablet, Rfl: 3   cetirizine (ZYRTEC) 10 MG tablet, Take 10 mg by mouth daily as needed for allergies., Disp: , Rfl:    Multiple Vitamin (MULTIVITAMIN WITH MINERALS) TABS tablet, Take 1 tablet by mouth daily., Disp: , Rfl:    naproxen (NAPROSYN) 500 MG tablet, Take 1 tablet (500 mg total) by mouth every 12 (twelve) hours as needed for mild pain or moderate pain., Disp: 30 tablet, Rfl: 1   omeprazole (PRILOSEC) 40 MG capsule, Take 40 mg by mouth daily as needed (heart burn). , Disp: , Rfl:   EXAM:  VITALS per patient if applicable:  GENERAL: alert, oriented, appears well and in no acute distress  HEENT: atraumatic, conjunttiva clear, no obvious abnormalities on inspection of external nose and ears  NECK: normal movements of the head and neck  LUNGS: on inspection no signs of respiratory distress, breathing rate appears normal, no obvious gross SOB, gasping or wheezing  CV: no obvious cyanosis  MS: moves all visible extremities without noticeable abnormality  PSYCH/NEURO: pleasant and cooperative, no obvious depression or anxiety, speech and thought processing grossly intact  ASSESSMENT AND  PLAN:  Discussed the following assessment and plan:  Rectal adenocarcinoma s/p robotic LAR resection 10/10/2017  Diarrhea, unspecified type  -We discussed FMLA papers with patient.  He basically needs intermittent leave not from actual workplace but more frequent breaks during the day for potentially increased frequency of loose stools.  Paperwork completed with assistance of patient.  We spent 15 minutes discussing specific requirements and how to appropriately fill out his forms.   I discussed the assessment and treatment plan with the patient. The patient was provided an opportunity to ask questions and all were answered. The patient agreed with the plan and demonstrated an understanding of the instructions.   The patient was advised to call back or seek an in-person evaluation if the symptoms worsen or if the condition fails to improve as anticipated.     Carolann Littler, MD

## 2022-06-05 ENCOUNTER — Telehealth: Payer: Self-pay | Admitting: Family Medicine

## 2022-06-05 NOTE — Telephone Encounter (Signed)
Patient informed that forms have been completed and faxed

## 2022-06-05 NOTE — Telephone Encounter (Signed)
Pt call and stated he want a call back about a form that was not received.

## 2022-07-04 ENCOUNTER — Telehealth: Payer: Self-pay

## 2022-07-04 NOTE — Telephone Encounter (Signed)
This nurse received a call from this patient stating he wanted to know if our office had received his paperwork that was dropped off on 9/11.  This nurse advised that the paperwork for [employer] was received.  Patient verified that the forms were not for FMLA but he needs work accommodations due to GI changes.  This nurse also advised patient that there is a section of the form that he will need to complete.  Patient acknowledged understanding.  No further questions and concerns at this time.

## 2022-07-12 ENCOUNTER — Telehealth: Payer: Self-pay

## 2022-07-12 NOTE — Telephone Encounter (Signed)
Pt LVM stating that when he was last in the office he left some paperwork (Accommodation Form) to be completed.  Pt was calling on the status of the completion of that form.  Returned pt's call but had to LVM stating this RN does not have this form and requesting if he remembers the name of the person he spoke with regarding this form.  Awaiting pt's return call.

## 2022-09-01 ENCOUNTER — Other Ambulatory Visit: Payer: Self-pay

## 2022-09-01 ENCOUNTER — Inpatient Hospital Stay: Payer: BC Managed Care – PPO

## 2022-09-01 ENCOUNTER — Encounter: Payer: Self-pay | Admitting: Hematology

## 2022-09-01 ENCOUNTER — Inpatient Hospital Stay: Payer: BC Managed Care – PPO | Attending: Hematology

## 2022-09-01 ENCOUNTER — Inpatient Hospital Stay (HOSPITAL_BASED_OUTPATIENT_CLINIC_OR_DEPARTMENT_OTHER): Payer: BC Managed Care – PPO | Admitting: Hematology

## 2022-09-01 VITALS — BP 140/93 | HR 69 | Temp 98.4°F | Resp 17 | Ht 72.01 in | Wt 204.1 lb

## 2022-09-01 DIAGNOSIS — C20 Malignant neoplasm of rectum: Secondary | ICD-10-CM

## 2022-09-01 DIAGNOSIS — Z23 Encounter for immunization: Secondary | ICD-10-CM

## 2022-09-01 LAB — CEA (IN HOUSE-CHCC): CEA (CHCC-In House): 2.32 ng/mL (ref 0.00–5.00)

## 2022-09-01 MED ORDER — INFLUENZA VAC SPLIT QUAD 0.5 ML IM SUSY
0.5000 mL | PREFILLED_SYRINGE | Freq: Once | INTRAMUSCULAR | Status: DC
  Filled 2022-09-01: qty 0.5

## 2022-09-01 NOTE — Progress Notes (Signed)
[place]   Telephone:([phone]) [phone] [phone]) [phone]   Clinic Follow up Note   Patient Care Team: Eulas Post, MD as PCP - General (Family Medicine) Arta Silence, MD as Consulting Physician (Gastroenterology) Michael Boston, MD as Consulting Physician (General Surgery) Kyung Rudd, MD as Consulting Physician (Radiation Oncology) Truitt Merle, MD as Consulting Physician (Oncology)  Date of Service:  09/01/2022  CHIEF COMPLAINT: f/u of rectal cancer  CURRENT THERAPY:  Surveillance  ASSESSMENT & PLAN:  Paul [name] is a 56 y.o. male with   1. Poorly differentiated invasive rectal adenocarcinoma, cT3N1M0, stage IIIb, ypT0N0 -diagnosed in 06/2017. S/p concurrent chemoradiation with Xeloda, Surgical resection and 5 cycles of adjuvant Xeloda. He is currently on surveillance.  -most recent CT AP 09/16/21 showed NED. -surveillance colonoscopy on 10/07/21 showed a small rectal ampulla, otherwise no abnormalities. A removed polyp showed tubular adenoma. Recall in 5 years. -he is clinically doing well except for bowel disturbances (see #2). He is now 5 years out from diagnosis. I will release him to continue f/u with GI and PCP.   2. Bowel disturbances -he reports his BM fluctuate between diarrhea and constipation, and he has urgency and incomplete emptying. -I feel this is likely related to surgery. I encouraged him to continue pelvic exercises.     PLAN: -f/u open   No problem-specific Assessment & Plan notes found for this encounter.   SUMMARY OF ONCOLOGIC HISTORY: Oncology History Overview Note  Cancer Staging Rectal adenocarcinoma [facility]) Staging form: Colon and Rectum, AJCC 8th Edition - Clinical stage from 07/06/2017: Stage IIIB (cT3, cN1, cM0) - Signed by [name], NP on 07/10/2017    Rectal adenocarcinoma s/p robotic LAR resection 10/10/2017  07/03/2017 Imaging   CT ABD PELVIS W CONTRAST IMPRESSION: Possible mass in the rectum compatible  carcinoma. Endoscopy recommended for further evaluation. Otherwise negative.   07/03/2017 Tumor Marker   CEA 2.7   07/05/2017 Initial Biopsy   Rectum biopsy -invasive adenocarcinoma, poorly differentiated   07/05/2017 Procedure   Colonoscopy Per Dr. Paulita Fujita Findings: The perianal and digital rectal examinations were normal.  Internal hemorrhoids were found during retroflexion. The hemorrhoids were moderate. No additional abnormalities were found on retroflexion.  A fungating, sessile and ulcerated partially obstructing large mass was found in the recto-sigmoid colon.The mass was partially circumferential (involving two-thirds of the lumen circumference). Oozing was present. Area was successfully injected with 2 mL [place] ink for tattooing. This was biopsied with a cold forceps for histology.   07/05/2017 Initial Diagnosis   Rectal adenocarcinoma ([place])   07/06/2017 Imaging   Staging MRI ABD/PELVIS revealed clinical stage T3N1M0, IIIb    07/18/2017 - 08/24/2017 Radiation Therapy   Radaition with Dr Lisbeth Renshaw     Radiation treatment dates:   07/18/2017 - 08/24/2017   Site/dose:   The rectum was treated to 45 Gy in 25 fractions of 1.8 Gy, followed by a 5.4 Gy boost in 3 fractions to yield a total dose of 50.4 Gy.   Narrative: The patient tolerated radiation treatment relatively well.   He had rectal bleeding with clots that was bright red after bowel movements. He reported loose stools that were painful. The skin to the radiation site was irritated, but no skin breakage was noted.    07/18/2017 Imaging    CT CHEST  IMPRESSION: Negative. No evidence of thoracic metastatic disease or other significant abnormality.     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  The Hereditary Gene Panel offered by [facility]  includes sequencing and/or deletion duplication testing of the following 47 genes: APC, ATM, AXIN2, BARD1, BMPR1A, BRCA1, BRCA2, BRIP1, CDH1, CDK4, CDKN2A (p14ARF), CDKN2A (p16INK4a), CHEK2, CTNNA1, DICER1, EPCAM (Deletion/duplication testing only), GREM1 (promoter region deletion/duplication testing only), KIT, MEN1, MLH1, MSH2, MSH3, MSH6, MUTYH, NBN, NF1, NHTL1, PALB2, PDGFRA, PMS2, POLD1, POLE, PTEN, RAD50, RAD51C, RAD51D, SDHB, SDHC, SDHD, SMAD4, SMARCA4. STK11, TP53, TSC1, TSC2, and VHL.  The following genes were evaluated for sequence changes only: SDHA and HOXB13 c.251G>A variant only.  The report date is August 27, 2017.    10/10/2017 Surgery   XI ROBOTIC ASSISTED LOWER ANTERIOR RESECTION AND RIGID PROCTOSCOPY bu Dr. Johney Maine and Dr. Dema Severin  10/10/17    10/10/2017 Pathology Results       Diagnosis 10/10/17  1. Colon, segmental resection for tumor, sigmoid SMALL FOCUS OF COLONIC GLANDS WITH HIGH GRADE DYSPLASIA POST NEOADJUVANT CHEMORADIATION THERAPY BIOPSY SITE WITH CHRONIC INFLAMMATION NO RESIDULE INVASIVE CARCINOMA PRESENT TWENTY-TWO BENIGN LYMPH NODES (0/22) 2. Colon, resection margin (donut), final distal BENIGN COLONIC TISSUE   11/19/2017 - 03/04/2018 Adjuvant Chemotherapy   He started adjuvant 2000 mg Xeloda BID 2 weeks on 1 week off on 11/19/17 and completed 5 cycles on 03/04/18   04/08/2018 Imaging   CT AP W Contrast 04/08/18 IMPRESSION: 1. Postoperative and post treatment related changes in the low anatomic pelvis, as above. Today's study will serve as a new baseline for future follow-up examinations. At this time, there is no definitive evidence to strongly suggest local recurrence of disease or definite metastatic disease in the abdomen or pelvis. 2. Mild pancreatic ductal dilatation. This is of uncertain etiology and significance. No obstructing lesion identified in the pancreatic head. Correlation with nonemergent MRI of the abdomen with and without IV gadolinium with  MRCP is suggested in the near future to evaluate for the etiology of this dilatation.     10/21/2018 Imaging   CT CAP W Contrast 10/21/18 IMPRESSION: Status post low anterior resection with suspected radiation changes in the presacral space. No evidence of recurrent or metastatic disease.   10/03/2019 Imaging   CT CAP W Contast  IMPRESSION: 1. No findings of recurrent malignancy. 2. Abnormal prominence of the dorsal pancreatic duct in the pancreatic body and head, measuring up to 6 mm in diameter, similar to prior, cause uncertain. 3. Wall thickening in the lower rectum below the level of the anastomotic staple line, probably therapy related. 4. Stable presacral soft tissue density, likely therapy related. 5. Mild degenerative disc disease at L4-5 and L5-S1.       08/18/2020 Imaging   CT CAP  IMPRESSION: 1. Redemonstrated postoperative findings of low anterior resection and reanastomosis with unchanged rectal thickening and presacral soft tissue thickening. No evidence of locally recurrent or metastatic disease in the chest, abdomen, or pelvis. 2. Prominence of the pancreatic duct is decreased compared to prior examination, generally remains of uncertain significance. Attention on follow-up.      INTERVAL HISTORY:  Paul [name] is here for a follow up of rectal cancer. He was last seen by me one year ago. He presents to the clinic alone. He reports he is doing well overall except for persistent issues with his BM. He explains they fluctuate between diarrhea and constipation. He also reports urgency and feeling like he's not completely "emptying" his bowels.  All other systems were reviewed with the patient and are negative.  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    SURGICAL HISTORY: Past Surgical History:  Procedure Laterality Date   COLONOSCOPY WITH PROPOFOL Left 07/05/2017   Procedure: COLONOSCOPY WITH PROPOFOL;  Surgeon: Arta Silence, MD;  Location: [place];  Service: Endoscopy;  Laterality: Left;   PROCTOSCOPY N/A 10/10/2017   Procedure: RIGID PROCTOSCOPY;  Surgeon: Michael Boston, MD;  Location: [facility];  Service: General;  Laterality: N/A;   XI ROBOTIC ASSISTED LOWER ANTERIOR RESECTION N/A 10/10/2017   Procedure: XI ROBOTIC ASSISTED LOWER ANTERIOR RESECTION;  Surgeon: Michael Boston, MD;  Location: [facility];  Service: General;  Laterality: N/A;    I have reviewed the social history and family history with the patient and they are unchanged from previous note.  ALLERGIES:  is allergic to chloroquine.  MEDICATIONS:  Current Outpatient Medications  Medication Sig Dispense Refill   amLODipine (NORVASC) 5 MG tablet Take 1 tablet (5 mg total) by mouth daily. 90 tablet 3   cetirizine (ZYRTEC) 10 MG tablet Take 10 mg by mouth daily as needed for allergies.     Multiple Vitamin (MULTIVITAMIN WITH MINERALS) TABS tablet Take 1 tablet by mouth daily.     naproxen (NAPROSYN) 500 MG tablet Take 1 tablet (500 mg total) by mouth every 12 (twelve) hours as needed for mild pain or moderate pain. 30 tablet 1   omeprazole (PRILOSEC) 40 MG capsule Take 40 mg by mouth daily as needed (heart burn).      No current facility-administered medications for this visit.    PHYSICAL EXAMINATION: ECOG PERFORMANCE STATUS: 0 - Asymptomatic  Vitals:   09/01/22 1101  BP: (!) 140/93  Pulse: 69  Resp: 17  Temp: 98.4 F (36.9 C)  SpO2: 99%   Wt Readings from Last 3 Encounters:  09/01/22 204 lb 1.6 oz (92.6 kg)  05/24/22 208 lb 12.8 oz (94.7 kg)  04/10/22 208 lb 12.8 oz (94.7 kg)     GENERAL:alert, no distress and  comfortable SKIN: skin color, texture, turgor are normal, no rashes or significant lesions EYES: normal, Conjunctiva are pink and non-injected, sclera clear  NECK: supple, thyroid normal size, non-tender, without nodularity LYMPH:  no palpable lymphadenopathy in the cervical, axillary  LUNGS: clear to auscultation and percussion with normal breathing effort HEART: regular rate & rhythm and no murmurs and no lower extremity edema ABDOMEN:abdomen soft, non-tender and normal bowel sounds Musculoskeletal:no cyanosis of digits and no clubbing  NEURO: alert & oriented x 3 with fluent speech, no focal motor/sensory deficits  LABORATORY DATA:  I have reviewed the data as listed    Latest Ref Rng & Units 09/02/2021   10:40 AM 03/04/2021   10:51 AM 10/04/2020    2:22 PM  CBC  WBC 4.0 - 10.5 K/uL 3.3  3.5  3.1   Hemoglobin 13.0 - 17.0 g/dL 13.5  13.6  14.9   Hematocrit 39.0 - 52.0 % 39.7  39.7  43.0   Platelets 150 - 400 K/uL 184  190  198         Latest Ref Rng & Units 09/02/2021  10:40 AM 03/04/2021   10:51 AM 10/04/2020    2:22 PM  CMP  Glucose 70 - 99 mg/dL 111  120  96   BUN 6 - 20 mg/dL _0 Creatinine 0.61 - 1.24 mg/dL 0.84  0.90  0.82   Sodium 135 - 145 mmol/L 141  141  143   Potassium 3.5 - 5.1 mmol/L 4.1  3.9  4.6   Chloride 98 - 111 mmol/L 106  105  104   CO2 22 - 32 mmol/L _1 Calcium 8.9 - 10.3 mg/dL 8.9  9.2  9.7   Total Protein 6.5 - 8.1 g/dL 7.2  7.2  7.7   Total Bilirubin 0.3 - 1.2 mg/dL 0.7  0.5  0.6   Alkaline Phos 38 - 126 U/L 78  74    AST 15 - 41 U/L _2 ALT 0 - 44 U/L _3 RADIOGRAPHIC STUDIES: I have personally reviewed the radiological images as listed and agreed with the findings in the report. No results found.    No orders of the defined types were placed in this encounter.  All questions were answered. The patient knows to call the clinic with any problems, questions or concerns. No barriers to learning was  detected. The total time spent in the appointment was 20 minutes.     Truitt Merle, MD 09/01/2022   I, Wilburn Mylar, am acting as scribe for Truitt Merle, MD.   I have reviewed the above documentation for accuracy and completeness, and I agree with the above.

## 2022-09-11 ENCOUNTER — Other Ambulatory Visit: Payer: Self-pay | Admitting: Family Medicine

## 2022-10-11 ENCOUNTER — Ambulatory Visit (INDEPENDENT_AMBULATORY_CARE_PROVIDER_SITE_OTHER): Payer: BC Managed Care – PPO | Admitting: Family Medicine

## 2022-10-11 VITALS — BP 120/78 | HR 87 | Temp 97.9°F | Ht 71.0 in | Wt 199.0 lb

## 2022-10-11 DIAGNOSIS — Z0000 Encounter for general adult medical examination without abnormal findings: Secondary | ICD-10-CM | POA: Diagnosis not present

## 2022-10-11 DIAGNOSIS — Z23 Encounter for immunization: Secondary | ICD-10-CM | POA: Diagnosis not present

## 2022-10-11 NOTE — Progress Notes (Signed)
Established Patient Office Visit  Subjective   Patient ID: Paul [name], male    DOB: May 06, 1966  Age: 56 y.o. MRN: [id number]  Chief Complaint  Patient presents with   Annual Exam    HPI   Seen today for complete physical.  He has history of rectal adenocarcinoma diagnosed in 2018.  Has had previous surgery and is followed regularly by oncology and had recent CEA level which was stable.  Still has some occasional alternating constipation and occasional loose stools since his surgery.  Due for follow-up labs.  Also has history of hypertension treated with amlodipine.  History of intermittent GERD symptoms.  Currently stable.  Health maintenance reviewed  -No history of Shingrix vaccine.  He does consent to getting this today. -Also needs flu vaccine. -Last colonoscopy was 1 year ago -Tetanus due 2026 -Previous hepatitis C screen negative  Social history-he is married and works for [employer].  His children are ages 61, 66, and 100.  Oldest will start college next year.  Patient never smoked.  No alcohol.  He is from [place]  Family history-father died age 37 of prostate cancer.  His mother is alive at age 39 and still lives in [place].  She has history of hypertension.  He states he had 18 siblings. 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   Past Surgical History:  Procedure Laterality Date   COLONOSCOPY WITH PROPOFOL Left 07/05/2017   Procedure: COLONOSCOPY WITH PROPOFOL;  Surgeon: Arta Silence, MD;  Location: [place];  Service: Endoscopy;  Laterality: Left;   PROCTOSCOPY N/A 10/10/2017   Procedure: RIGID PROCTOSCOPY;  Surgeon:  Michael Boston, MD;  Location: [facility];  Service: General;  Laterality: N/A;   XI ROBOTIC ASSISTED LOWER ANTERIOR RESECTION N/A 10/10/2017   Procedure: XI ROBOTIC ASSISTED LOWER ANTERIOR RESECTION;  Surgeon: Michael Boston, MD;  Location: [facility];  Service: General;  Laterality: N/A;    reports that he has never smoked. He has never used smokeless tobacco. He reports that he does not drink alcohol and does not use drugs. family history includes Asthma in his daughter, father, son, and son; Cancer in his father; Dementia in his maternal grandmother; Hypertension in his mother. Allergies  Allergen Reactions   Chloroquine Itching    Review of Systems  Constitutional:  Negative for chills, fever, malaise/fatigue and weight loss.  HENT:  Negative for hearing loss.   Eyes:  Negative for blurred vision and double vision.  Respiratory:  Negative for cough and shortness of breath.   Cardiovascular:  Negative for chest pain, palpitations and leg swelling.  Gastrointestinal:  Negative for abdominal pain, blood in stool, constipation and diarrhea.  Genitourinary:  Negative for dysuria.  Skin:  Negative for rash.  Neurological:  Negative for dizziness, speech change, seizures, loss of consciousness and headaches.  Psychiatric/Behavioral:  Negative for depression.       Objective:     BP 120/78 (BP Location: Left Arm, Patient Position: Sitting, Cuff Size: Normal)   Pulse 87   Temp 97.9 F (36.6 C) (Oral)   Ht '5\' 11"'$  (1.803 m)   Wt 199 lb (90.3 kg)  SpO2 98%   BMI 27.75 kg/m  BP Readings from Last 3 Encounters:  10/11/22 120/78  09/01/22 (!) 140/93  04/10/22 (!) 160/88   Wt Readings from Last 3 Encounters:  10/11/22 199 lb (90.3 kg)  09/01/22 204 lb 1.6 oz (92.6 kg)  05/24/22 208 lb 12.8 oz (94.7 kg)      Physical Exam Vitals reviewed.  Constitutional:      General: He is not in acute distress.    Appearance: He is well-developed. He is not ill-appearing.  HENT:     Head:  Normocephalic and atraumatic.     Right Ear: External ear normal.     Left Ear: External ear normal.  Eyes:     Conjunctiva/sclera: Conjunctivae normal.     Pupils: Pupils are equal, round, and reactive to light.  Neck:     Thyroid: No thyromegaly.  Cardiovascular:     Rate and Rhythm: Normal rate and regular rhythm.     Heart sounds: Normal heart sounds. No murmur heard. Pulmonary:     Effort: No respiratory distress.     Breath sounds: No wheezing or rales.  Abdominal:     General: Bowel sounds are normal. There is no distension.     Palpations: Abdomen is soft. There is no mass.     Tenderness: There is no abdominal tenderness. There is no guarding or rebound.  Musculoskeletal:     Cervical back: Normal range of motion and neck supple.     Right lower leg: No edema.     Left lower leg: No edema.  Lymphadenopathy:     Cervical: No cervical adenopathy.  Skin:    Findings: No rash.  Neurological:     Mental Status: He is alert and oriented to person, place, and time.     Cranial Nerves: No cranial nerve deficit.      No results found for any visits on 10/11/22.    The ASCVD Risk score (Arnett DK, et al., 2019) failed to calculate for the following reasons:   The valid total cholesterol range is 130 to 320 mg/dL   Unable to determine if patient is Non-Hispanic African American    Assessment & Plan:   Problem List Items Addressed This Visit   None Visit Diagnoses     Need for shingles vaccine    -  Primary   Relevant Orders   Zoster Recombinant (Shingrix ) (Completed)   Flu vaccine need       Relevant Orders   Flu Vaccine QUAD 6+ mos PF IM (Fluarix Quad PF) (Completed)   Physical exam       Relevant Orders   Basic metabolic panel   Lipid panel   CBC with Differential/Platelet   TSH   Hepatic function panel   PSA     -Flu vaccine and shingles vaccine given.  Reminder to get repeat shingles vaccine in 2 to 6 months -Discussed importance of regular  exercise.  He currently exercises about 2 days/week.  Recommend minimum 150 minutes of moderate aerobic exercise per week daily with some resistance training -Obtain screening labs as above  No follow-ups on file.    Carolann Littler, MD

## 2022-10-11 NOTE — Patient Instructions (Signed)
Remember to get repeat Shingrix vaccine in 2 to 6 months.

## 2022-10-12 LAB — LIPID PANEL
Cholesterol: 118 mg/dL (ref 0–200)
HDL: 67.4 mg/dL (ref >39.00)
LDL Cholesterol: 37 mg/dL (ref 0–99)
NonHDL: 50.28
Total CHOL/HDL Ratio: 2
Triglycerides: 65 mg/dL (ref 0.0–149.0)
VLDL: 13 mg/dL (ref 0.0–40.0)

## 2022-10-12 LAB — HEPATIC FUNCTION PANEL
ALT: 28 U/L (ref 0–53)
AST: 29 U/L (ref 0–37)
Albumin: 4.9 g/dL (ref 3.5–5.2)
Alkaline Phosphatase: 72 U/L (ref 39–117)
Bilirubin, Direct: 0.2 mg/dL (ref 0.0–0.3)
Total Bilirubin: 0.7 mg/dL (ref 0.2–1.2)
Total Protein: 7.4 g/dL (ref 6.0–8.3)

## 2022-10-12 LAB — BASIC METABOLIC PANEL
BUN: 12 mg/dL (ref 6–23)
CO2: 26 mEq/L (ref 19–32)
Calcium: 9.7 mg/dL (ref 8.4–10.5)
Chloride: 103 mEq/L (ref 96–112)
Creatinine, Ser: 0.95 mg/dL (ref 0.40–1.50)
GFR: 90.61 mL/min (ref >60.00)
Glucose, Bld: 96 mg/dL (ref 70–99)
Potassium: 4 mEq/L (ref 3.5–5.1)
Sodium: 142 mEq/L (ref 135–145)

## 2022-10-12 LAB — CBC WITH DIFFERENTIAL/PLATELET
Basophils Absolute: 0.1 10*3/uL (ref 0.0–0.1)
Basophils Relative: 1.2 % (ref 0.0–3.0)
Eosinophils Absolute: 0.5 10*3/uL (ref 0.0–0.7)
Eosinophils Relative: 11.4 % — ABNORMAL HIGH (ref 0.0–5.0)
HCT: 41.1 % (ref 39.0–52.0)
Hemoglobin: 13.9 g/dL (ref 13.0–17.0)
Lymphocytes Relative: 26.4 % (ref 12.0–46.0)
Lymphs Abs: 1.2 10*3/uL (ref 0.7–4.0)
MCHC: 33.8 g/dL (ref 30.0–36.0)
MCV: 89.2 fl (ref 78.0–100.0)
Monocytes Absolute: 0.4 10*3/uL (ref 0.1–1.0)
Monocytes Relative: 8.6 % (ref 3.0–12.0)
Neutro Abs: 2.5 10*3/uL (ref 1.4–7.7)
Neutrophils Relative %: 52.4 % (ref 43.0–77.0)
Platelets: 224 10*3/uL (ref 150.0–400.0)
RBC: 4.61 Mil/uL (ref 4.22–5.81)
RDW: 13.8 % (ref 11.5–15.5)
WBC: 4.7 10*3/uL (ref 4.0–10.5)

## 2022-10-12 LAB — PSA: PSA: 0.86 ng/mL (ref 0.10–4.00)

## 2022-10-12 LAB — TSH: TSH: 1.31 u[IU]/mL (ref 0.35–5.50)

## 2022-12-08 ENCOUNTER — Other Ambulatory Visit: Payer: Self-pay | Admitting: Family Medicine

## 2023-01-12 ENCOUNTER — Ambulatory Visit (INDEPENDENT_AMBULATORY_CARE_PROVIDER_SITE_OTHER): Payer: BC Managed Care – PPO | Admitting: *Deleted

## 2023-01-12 DIAGNOSIS — Z23 Encounter for immunization: Secondary | ICD-10-CM

## 2023-07-24 DIAGNOSIS — H40061 Primary angle closure without glaucoma damage, right eye: Secondary | ICD-10-CM | POA: Diagnosis not present

## 2023-09-14 DIAGNOSIS — Z01818 Encounter for other preprocedural examination: Secondary | ICD-10-CM | POA: Diagnosis not present

## 2023-09-24 DIAGNOSIS — H268 Other specified cataract: Secondary | ICD-10-CM | POA: Diagnosis not present

## 2023-09-24 DIAGNOSIS — H25812 Combined forms of age-related cataract, left eye: Secondary | ICD-10-CM | POA: Diagnosis not present

## 2023-09-24 DIAGNOSIS — I10 Essential (primary) hypertension: Secondary | ICD-10-CM | POA: Diagnosis not present

## 2023-09-24 DIAGNOSIS — H40062 Primary angle closure without glaucoma damage, left eye: Secondary | ICD-10-CM | POA: Diagnosis not present

## 2023-09-24 DIAGNOSIS — H2512 Age-related nuclear cataract, left eye: Secondary | ICD-10-CM | POA: Diagnosis not present

## 2023-09-27 ENCOUNTER — Telehealth: Payer: Self-pay | Admitting: Family Medicine

## 2023-09-27 NOTE — Telephone Encounter (Signed)
Requesting a CT scan prior to his CPE appt on 10/15/23. Says his history of colon cancer was previously discussed with provider

## 2023-09-28 ENCOUNTER — Encounter: Payer: Self-pay | Admitting: Primary Care

## 2023-09-28 ENCOUNTER — Ambulatory Visit (INDEPENDENT_AMBULATORY_CARE_PROVIDER_SITE_OTHER): Payer: BC Managed Care – PPO | Admitting: Primary Care

## 2023-09-28 VITALS — BP 140/84 | HR 71 | Temp 98.5°F | Ht 73.23 in | Wt 209.0 lb

## 2023-09-28 DIAGNOSIS — R0683 Snoring: Secondary | ICD-10-CM | POA: Diagnosis not present

## 2023-09-28 NOTE — Progress Notes (Signed)
@Patient  ID: Paul [name], male    DOB: 1966/10/12, 57 y.o.   MRN: [id number]  No chief complaint on file.   Referring provider: Kristian Covey, MD  HPI: 57 year old male, never smoked. PMH significant for HTN, GERD, iron deficiency anemia, rectal adenocarcinoma.   09/28/2023 Discussed the use of AI scribe software for clinical note transcription with the patient, who gave verbal consent to proceed.  History of Present Illness   The patient, who has been experiencing symptoms of loud and disruptive snoring, comes in today for a sleep consult. The snoring has been so severe that his partner has resorted to using earplugs. The patient has not experienced gasping for air or choking during sleep, but has reported instances of what feels like acid reflux, causing him to wake up coughing. This occurs almost nightly, but is intermittent. The patient has previously taken medication for this issue, which seemed to alleviate the symptoms.  The patient's typical bedtime is from 12 AM to 1 AM, waking up once or twice during the night, and starting his day at 7 AM. Despite getting about six to seven hours of sleep, the patient reports feeling tired during the day. The patient occasionally takes naps during the day when possible.  The patient has no history of cardiac issues, seizures, narcolepsy, or sleepwalking. He has not reported any instances of thrashing, kicking, or hurting anyone during sleep. The patient's Epworth score is a six, indicating a lower level of daytime sleepiness.      Sleep questionnaire Symptoms- snoring    Prior sleep study- none Bedtime-12am-1am Time to fall asleep- no long Nocturnal awakenings- 1-2 times Out of bed/start of day- 7am Weight changes- no Do you operate heavy machinery- no Do you currently wear CPAP- no Do you current wear oxygen- no Epworth- 6   Allergies  Allergen Reactions   Chloroquine Itching    Immunization History  Administered Date(s)  Administered   Influenza Split 09/09/2011   Influenza,inj,Quad PF,6+ Mos 08/14/2013, 06/26/2014, 10/01/2015, 09/29/2016, 06/27/2017, 07/24/2018, 07/03/2019, 10/11/2022   Influenza-Unspecified 09/02/2020, 08/19/2021   Meningococcal Conjugate 11/08/2004   PFIZER(Purple Top)SARS-COV-2 Vaccination 02/11/2020, 02/29/2020, 09/02/2020   Pfizer Covid-19 Vaccine Bivalent Booster 54yrs & up 08/19/2021   Td 11/08/2004   Tdap 10/01/2015   Zoster Recombinant(Shingrix) 10/11/2022, 01/12/2023    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     Multiple Vitamin (MULTIVITAMIN WITH MINERALS) TABS tablet Take 1 tablet by mouth daily.     naproxen (NAPROSYN) 500 MG tablet Take 1 tablet (500 mg total) by mouth every 12 (twelve) hours as needed for mild pain or moderate pain. 30 tablet 1   omeprazole (PRILOSEC) 40 MG capsule Take 40 mg by mouth daily as needed (heart burn).      No facility-administered  medications prior to visit.    Review of Systems  Review of Systems  Constitutional:  Positive for fatigue.  HENT: Negative.    Respiratory: Negative.    Cardiovascular: Negative.    Physical Exam  There were no vitals taken for this visit. Physical Exam Constitutional:      Appearance: Normal appearance.  HENT:     Head:  Normocephalic and atraumatic.  Cardiovascular:     Rate and Rhythm: Normal rate and regular rhythm.  Pulmonary:     Effort: Pulmonary effort is normal.     Breath sounds: Normal breath sounds. No wheezing, rhonchi or rales.  Skin:    General: Skin is warm and dry.  Neurological:     General: No focal deficit present.     Mental Status: He is alert and oriented to person, place, and time. Mental status is at baseline.  Psychiatric:        Mood and Affect: Mood normal.        Behavior: Behavior normal.        Thought Content: Thought content normal.        Judgment: Judgment normal.      Lab Results:  CBC    Component Value Date/Time   WBC 4.7 10/11/2022 1609   RBC 4.61 10/11/2022 1609   HGB 13.9 10/11/2022 1609   HGB 12.7 (L) 10/25/2017 0927   HCT 41.1 10/11/2022 1609   HCT 38.0 (L) 10/25/2017 0927   PLT 224.0 10/11/2022 1609   PLT 200 10/25/2017 0927   MCV 89.2 10/11/2022 1609   MCV 85.8 10/25/2017 0927   MCH 29.5 09/02/2021 1040   MCHC 33.8 10/11/2022 1609   RDW 13.8 10/11/2022 1609   RDW 14.7 (H) 10/25/2017 0927   LYMPHSABS 1.2 10/11/2022 1609   LYMPHSABS 0.6 (L) 10/25/2017 0927   MONOABS 0.4 10/11/2022 1609   MONOABS 0.2 10/25/2017 0927   EOSABS 0.5 10/11/2022 1609   EOSABS 0.1 10/25/2017 0927   BASOSABS 0.1 10/11/2022 1609   BASOSABS 0.0 10/25/2017 0927    BMET    Component Value Date/Time   NA 142 10/11/2022 1609   NA 142 10/25/2017 0927   K 4.0 10/11/2022 1609   K 4.4 10/25/2017 0927   CL 103 10/11/2022 1609   CO2 26 10/11/2022 1609   CO2 31 (H) 10/25/2017 0927   GLUCOSE 96 10/11/2022 1609   GLUCOSE 88 10/25/2017 0927   BUN 12 10/11/2022 1609   BUN 11.3 10/25/2017 0927   CREATININE 0.95 10/11/2022 1609   CREATININE 0.82 10/04/2020 1422   CREATININE 0.9 10/25/2017 0927   CALCIUM 9.7 10/11/2022 1609   CALCIUM 9.6 10/25/2017 0927   GFRNONAA >60 09/02/2021 1040   GFRAA >60 02/19/2020 1044    BNP No results found for: "BNP"  ProBNP No  results found for: "PROBNP"  Imaging: No results found.   Assessment & Plan:   1. Loud snoring - Home sleep test; Future    Loud Snoring Patient has symptoms of loud snoring. No reported gasping or choking, but occasional coughing suggestive of possible reflux.  We reviewed risks of untreated sleep apnea including cardiac arrhythmias, pulmonary hypertension diabetes stroke.  Also discussed treatment options including weight loss, oral appliance, CPAP therapy referral to ENT for possible surgical options. -Order home sleep study to evaluate for sleep apnea. -Discussed potential interventions including positional changes, oral appliance, and CPAP depending on severity of sleep apnea.  Gastroesophageal Reflux Disease (GERD) Reports of nightly acid reflux symptoms, previously managed with  medication. -Provide dietary advice to help manage reflux symptoms. -Recommend use of a wedge pillow to reduce reflux during sleep.  Insufficient Sleep Reports of feeling tired, possibly due to insufficient sleep duration due to work schedule and responsibilities. -Encourage attempts to increase total sleep duration, possibly through napping during the day. -Schedule follow-up in approximately six weeks to discuss sleep study results.      Glenford Bayley, NP 09/28/2023

## 2023-09-28 NOTE — Telephone Encounter (Signed)
Patient informed of the message below and voiced understanding  

## 2023-09-28 NOTE — Patient Instructions (Addendum)
-LOUD SNORING: Your loud snoring may be a sign of obstructive sleep apnea, a condition where breathing stops and starts during sleep. We have ordered a home sleep study to evaluate this further. Depending on the results, we may consider interventions like changing your sleeping position, using an oral appliance, or a CPAP machine.  -GASTROESOPHAGEAL REFLUX DISEASE (GERD): GERD is a condition where stomach acid frequently flows back into the tube connecting your mouth and stomach, causing discomfort. We recommend dietary changes and using a wedge pillow to help manage your symptoms.  Recommendations: Focus on side sleeping position or elevate head with wedge pillow 30 degrees Work on weight loss efforts if able  Do not drive if experiencing excessive daytime sleepiness of fatigue    Orders: Home sleep study re: loud snoring (ordered)    Follow-up: Either schedule follow-up in 6 weeks with Beth NP or  call after completing home sleep study to review results and treatment if needed (can be virtual)  ____________________________________________________________________________________  Food Choices for Gastroesophageal Reflux Disease, Adult When you have gastroesophageal reflux disease (GERD), the foods you eat and your eating habits are very important. Choosing the right foods can help ease your discomfort. Think about working with a food expert (dietitian) to help you make good choices. What are tips for following this plan? Reading food labels Look for foods that are low in saturated fat. Foods that may help with your symptoms include: Foods that have less than 5% of daily value (DV) of fat. Foods that have 0 grams of trans fat. Cooking Do not fry your food. Cook your food by baking, steaming, grilling, or broiling. These are all methods that do not need a lot of fat for cooking. To add flavor, try to use herbs that are low in spice and acidity. Meal planning  Choose healthy foods that  are low in fat, such as: Fruits and vegetables. Whole grains. Low-fat dairy products. Lean meats, fish, and poultry. Eat small meals often instead of eating 3 large meals each day. Eat your meals slowly in a place where you are relaxed. Avoid bending over or lying down until 2-3 hours after eating. Limit high-fat foods such as fatty meats or fried foods. Limit your intake of fatty foods, such as oils, butter, and shortening. Avoid the following as told by your doctor: Foods that cause symptoms. These may be different for different people. Keep a food diary to keep track of foods that cause symptoms. Alcohol. Drinking a lot of liquid with meals. Eating meals during the 2-3 hours before bed. Lifestyle Stay at a healthy weight. Ask your doctor what weight is healthy for you. If you need to lose weight, work with your doctor to do so safely. Exercise for at least 30 minutes on 5 or more days each week, or as told by your doctor. Wear loose-fitting clothes. Do not smoke or use any products that contain nicotine or tobacco. If you need help quitting, ask your doctor. Sleep with the head of your bed higher than your feet. Use a wedge under the mattress or blocks under the bed frame to raise the head of the bed. Chew sugar-free gum after meals. What foods should eat?  Eat a healthy, well-balanced diet of fruits, vegetables, whole grains, low-fat dairy products, lean meats, fish, and poultry. Each person is different. Foods that may cause symptoms in one person may not cause any symptoms in another person. Work with your doctor to find foods that are safe for you.  The items listed above may not be a complete list of what you can eat and drink. Contact a food expert for more options. What foods should I avoid? Limiting some of these foods may help in managing the symptoms of GERD. Everyone is different. Talk with a food expert or your doctor to help you find the exact foods to avoid, if  any. Fruits Any fruits prepared with added fat. Any fruits that cause symptoms. For some people, this may include citrus fruits, such as oranges, grapefruit, pineapple, and lemons. Vegetables Deep-fried vegetables. [place] fries. Any vegetables prepared with added fat. Any vegetables that cause symptoms. For some people, this may include tomatoes and tomato products, chili peppers, onions and garlic, and horseradish. Grains Pastries or quick breads with added fat. Meats and other proteins High-fat meats, such as fatty beef or pork, hot dogs, ribs, ham, sausage, salami, and bacon. Fried meat or protein, including fried fish and fried chicken. Nuts and nut butters, in large amounts. Dairy Whole milk and chocolate milk. Sour cream. Cream. Ice cream. Cream cheese. Milkshakes. Fats and oils Butter. Margarine. Shortening. Ghee. Beverages Coffee and tea, with or without caffeine. Carbonated beverages. Sodas. Energy drinks. Fruit juice made with acidic fruits, such as orange or grapefruit. Tomato juice. Alcoholic drinks. Sweets and desserts Chocolate and cocoa. Donuts. Seasonings and condiments Pepper. Peppermint and spearmint. Added salt. Any condiments, herbs, or seasonings that cause symptoms. For some people, this may include curry, hot sauce, or vinegar-based salad dressings. The items listed above may not be a complete list of what you should not eat and drink. Contact a food expert for more options. Questions to ask your doctor Diet and lifestyle changes are often the first steps that are taken to manage symptoms of GERD. If diet and lifestyle changes do not help, talk with your doctor about taking medicines. Where to find more information International Foundation for Gastrointestinal Disorders: aboutgerd.org Summary When you have GERD, food and lifestyle choices are very important in easing your symptoms. Eat small meals often instead of 3 large meals a day. Eat your meals slowly and in a  place where you are relaxed. Avoid bending over or lying down until 2-3 hours after eating. Limit high-fat foods such as fatty meats or fried foods. This information is not intended to replace advice given to you by your health care provider. Make sure you discuss any questions you have with your health care provider. Document Revised: 04/19/2020 Document Reviewed: 04/19/2020 Elsevier Patient Education  2024 [employer].

## 2023-10-08 DIAGNOSIS — F419 Anxiety disorder, unspecified: Secondary | ICD-10-CM | POA: Diagnosis not present

## 2023-10-08 DIAGNOSIS — H40061 Primary angle closure without glaucoma damage, right eye: Secondary | ICD-10-CM | POA: Diagnosis not present

## 2023-10-08 DIAGNOSIS — H268 Other specified cataract: Secondary | ICD-10-CM | POA: Diagnosis not present

## 2023-10-08 DIAGNOSIS — H2511 Age-related nuclear cataract, right eye: Secondary | ICD-10-CM | POA: Diagnosis not present

## 2023-10-08 DIAGNOSIS — H25811 Combined forms of age-related cataract, right eye: Secondary | ICD-10-CM | POA: Diagnosis not present

## 2023-10-12 DIAGNOSIS — R0683 Snoring: Secondary | ICD-10-CM

## 2023-10-15 ENCOUNTER — Ambulatory Visit (INDEPENDENT_AMBULATORY_CARE_PROVIDER_SITE_OTHER): Payer: BC Managed Care – PPO | Admitting: Family Medicine

## 2023-10-15 ENCOUNTER — Encounter: Payer: Self-pay | Admitting: Family Medicine

## 2023-10-15 VITALS — BP 150/88 | HR 60 | Temp 98.0°F | Ht 72.05 in | Wt 203.9 lb

## 2023-10-15 DIAGNOSIS — Z23 Encounter for immunization: Secondary | ICD-10-CM | POA: Diagnosis not present

## 2023-10-15 DIAGNOSIS — I10 Essential (primary) hypertension: Secondary | ICD-10-CM

## 2023-10-15 DIAGNOSIS — C20 Malignant neoplasm of rectum: Secondary | ICD-10-CM | POA: Diagnosis not present

## 2023-10-15 DIAGNOSIS — Z0000 Encounter for general adult medical examination without abnormal findings: Secondary | ICD-10-CM

## 2023-10-15 LAB — CBC WITH DIFFERENTIAL/PLATELET
Basophils Absolute: 0 10*3/uL (ref 0.0–0.1)
Basophils Relative: 1.2 % (ref 0.0–3.0)
Eosinophils Absolute: 0.5 10*3/uL (ref 0.0–0.7)
Eosinophils Relative: 12.1 % — ABNORMAL HIGH (ref 0.0–5.0)
HCT: 44.5 % (ref 39.0–52.0)
Hemoglobin: 15 g/dL (ref 13.0–17.0)
Lymphocytes Relative: 28.8 % (ref 12.0–46.0)
Lymphs Abs: 1.1 10*3/uL (ref 0.7–4.0)
MCHC: 33.6 g/dL (ref 30.0–36.0)
MCV: 89.9 fL (ref 78.0–100.0)
Monocytes Absolute: 0.2 10*3/uL (ref 0.1–1.0)
Monocytes Relative: 6.5 % (ref 3.0–12.0)
Neutro Abs: 1.9 10*3/uL (ref 1.4–7.7)
Neutrophils Relative %: 51.4 % (ref 43.0–77.0)
Platelets: 196 10*3/uL (ref 150.0–400.0)
RBC: 4.95 Mil/uL (ref 4.22–5.81)
RDW: 13.7 % (ref 11.5–15.5)
WBC: 3.8 10*3/uL — ABNORMAL LOW (ref 4.0–10.5)

## 2023-10-15 LAB — HEPATIC FUNCTION PANEL
ALT: 36 U/L (ref 0–53)
AST: 32 U/L (ref 0–37)
Albumin: 5 g/dL (ref 3.5–5.2)
Alkaline Phosphatase: 73 U/L (ref 39–117)
Bilirubin, Direct: 0.2 mg/dL (ref 0.0–0.3)
Total Bilirubin: 0.8 mg/dL (ref 0.2–1.2)
Total Protein: 7.8 g/dL (ref 6.0–8.3)

## 2023-10-15 LAB — LIPID PANEL
Cholesterol: 134 mg/dL (ref 0–200)
HDL: 70.5 mg/dL (ref >39.00)
LDL Cholesterol: 41 mg/dL (ref 0–99)
NonHDL: 63.61
Total CHOL/HDL Ratio: 2
Triglycerides: 114 mg/dL (ref 0.0–149.0)
VLDL: 22.8 mg/dL (ref 0.0–40.0)

## 2023-10-15 LAB — BASIC METABOLIC PANEL
BUN: 9 mg/dL (ref 6–23)
CO2: 33 meq/L — ABNORMAL HIGH (ref 19–32)
Calcium: 9.5 mg/dL (ref 8.4–10.5)
Chloride: 101 meq/L (ref 96–112)
Creatinine, Ser: 0.83 mg/dL (ref 0.40–1.50)
GFR: 98.38 mL/min (ref >60.00)
Glucose, Bld: 117 mg/dL — ABNORMAL HIGH (ref 70–99)
Potassium: 4.1 meq/L (ref 3.5–5.1)
Sodium: 141 meq/L (ref 135–145)

## 2023-10-15 LAB — PSA: PSA: 1.5 ng/mL (ref 0.10–4.00)

## 2023-10-15 MED ORDER — VALSARTAN 80 MG PO TABS: 80.0000 mg | ORAL_TABLET | Freq: Every day | ORAL | 3 refills | Status: DC

## 2023-10-15 NOTE — Progress Notes (Signed)
Established Patient Office Visit  Subjective   Patient ID: Paul [name], male    DOB: 07/24/1966  Age: 58 y.o. MRN: [id number]  Chief Complaint  Patient presents with   Annual Exam    HPI   Paul [name] is seen today for complete physical.   Generally doing well.  Did have some recent dull achy pain left lower quadrant which is now resolved.  Ever since his surgery has had some intermittent loose stools.  Will be bringing in a work accommodation forms related to that.  He has hypertension currently treated with amlodipine 5 mg daily.  Has had several home blood pressures recently run 140 systolic and had recent evaluation with pulmonary with blood pressure 140/84.  He had recent home study to rule out sleep apnea and results are pending.  He does admit to adding lots of sodium to his food.  Health maintenance reviewed:  Health Maintenance  Topic Date Due   HIV Screening  Never done   INFLUENZA VACCINE  05/24/2023   COVID-19 Vaccine (5 - 2024-25 season) 06/24/2023   DTaP/Tdap/Td (3 - Td or Tdap) 09/30/2025   Colonoscopy  10/08/2031   Hepatitis C Screening  Completed   Zoster Vaccines- Shingrix  Completed   HPV VACCINES  Aged Out   -Does need flu vaccine and he consents to getting that today -Colonoscopy due 2027 -Tetanus due 2026 -Shingrix vaccine complete  Social history-he is married and works for [employer].  His children are ages 76, 9, and 56.  Oldest attends [employer].  Patient never smoked.  No alcohol.  He is from [place]   Family history-father died age 23 of prostate cancer.  His mother is alive at age 60 and still lives in [place].  She has history of hypertension.  He states he had 18 siblings. 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   Past Surgical History:  Procedure Laterality Date   COLONOSCOPY WITH PROPOFOL Left 07/05/2017   Procedure: COLONOSCOPY WITH PROPOFOL;  Surgeon: Willis Modena, MD;  Location: [facility] ENDOSCOPY;  Service: Endoscopy;  Laterality: Left;   PROCTOSCOPY N/A 10/10/2017   Procedure: RIGID PROCTOSCOPY;  Surgeon: Karie Soda, MD;  Location: [facility] ORS;  Service: General;  Laterality: N/A;   XI ROBOTIC ASSISTED LOWER ANTERIOR RESECTION N/A 10/10/2017   Procedure: XI ROBOTIC ASSISTED LOWER ANTERIOR RESECTION;  Surgeon: Karie Soda, MD;  Location: [facility] ORS;  Service: General;  Laterality: N/A;    reports that he has never smoked. He has never used smokeless tobacco. He reports that he does not drink alcohol and does not use drugs. family history includes Asthma in his daughter, father, son, and son; Cancer in his father; Dementia in his maternal grandmother; Hypertension in his mother. Allergies  Allergen Reactions   Chloroquine Itching    Review of Systems  Constitutional:  Negative for chills, fever, malaise/fatigue and weight loss.  HENT:  Negative for hearing loss.   Eyes:  Negative for blurred vision and double vision.  Respiratory:  Negative for cough and shortness of breath.   Cardiovascular:  Negative for chest pain, palpitations and leg swelling.  Gastrointestinal:  Negative for blood  in stool, constipation, diarrhea, nausea and vomiting.  Genitourinary:  Negative for dysuria.  Skin:  Negative for rash.  Neurological:  Negative for dizziness, speech change, seizures, loss of consciousness and headaches.  Psychiatric/Behavioral:  Negative for depression.       Objective:     BP (!) 150/88 (BP Location: Left Arm, Patient Position: Sitting, Cuff Size: Normal)   Pulse 60   Temp 98 F (36.7 C) (Oral)   Ht 6' 0.05" (1.83 m)   Wt 203 lb 14.4 oz (92.5 kg)   SpO2 98%   BMI 27.62 kg/m   BP Readings from Last 3 Encounters:  10/15/23 (!) 150/88  09/28/23 (!) 140/84  10/11/22 120/78   Wt Readings from Last 3 Encounters:  10/15/23 203 lb 14.4 oz (92.5 kg)  09/28/23 209 lb (94.8 kg)  10/11/22 199 lb (90.3 kg)      Physical Exam Vitals reviewed.  Constitutional:      General: He is not in acute distress.    Appearance: He is well-developed.  HENT:     Head: Normocephalic and atraumatic.     Right Ear: External ear normal.     Left Ear: External ear normal.  Eyes:     Pupils: Pupils are equal, round, and reactive to light.  Neck:     Thyroid: No thyromegaly.  Cardiovascular:     Rate and Rhythm: Normal rate and regular rhythm.     Heart sounds: Normal heart sounds. No murmur heard. Pulmonary:     Effort: No respiratory distress.     Breath sounds: No wheezing or rales.  Abdominal:     General: Bowel sounds are normal. There is no distension.     Palpations: Abdomen is soft. There is no mass.     Tenderness: There is no abdominal tenderness. There is no guarding or rebound.  Musculoskeletal:     Cervical back: Normal range of motion and neck supple.     Right lower leg: No edema.     Left lower leg: No edema.  Lymphadenopathy:     Cervical: No cervical adenopathy.  Skin:    Findings: No rash.  Neurological:     Mental Status: He is alert and oriented to person, place, and time.     Cranial Nerves: No cranial nerve deficit.      No results found for any visits on 10/15/23.    The ASCVD Risk score (Arnett DK, et al., 2019) failed to calculate for the following reasons:   The valid total cholesterol range is 130 to 320 mg/dL   Unable to determine if patient is Non-Hispanic African American    Assessment & Plan:   Problem List Items Addressed This Visit       Unprioritized   Rectal adenocarcinoma s/p robotic LAR resection 10/10/2017 - Primary   Relevant Orders   CEA   Other Visit Diagnoses       Physical exam       Relevant Orders    Basic metabolic panel   Lipid panel   CBC with Differential/Platelet   Hepatic function panel   PSA     57 year old male with history of hypertension and rectal adenocarcinoma diagnosed 2018.  Has had several recent elevated blood pressures including up today.  Repeat left arm seated after rest 150/88.  Discussed the following items  -Flu vaccine recommended and patient consents -Other health maintenance up-to-date -Will obtain labs as above and include CEA levels with his history of adenocarcinoma rectum -Discussed  DASH diet and handout given -Discussed nonpharmacologic management of hypertension -Add Diovan 80 mg daily to his amlodipine and bring back in 1 month to reassess blood pressure  Return in about 1 month (around 11/15/2023).    Evelena Peat, MD

## 2023-10-15 NOTE — Patient Instructions (Signed)
Set up one month follow up.

## 2023-10-16 LAB — CEA: CEA: 2 ng/mL

## 2023-11-07 ENCOUNTER — Telehealth: Payer: Self-pay | Admitting: Family Medicine

## 2023-11-07 NOTE — Telephone Encounter (Signed)
 Copied from CRM [id number]. Topic: General - Other >> Nov 07, 2023  9:06 AM Tisa Forester wrote: Reason for CRM: Patient checking on status of his employer [employer] fax some paperwork accommodation form fax sometime last week   to doctor Burchette , confirmed [place] fax number [phone] to patient that his employer used  Please call patient to let know the status # at [phone]

## 2023-11-08 NOTE — Telephone Encounter (Signed)
Left a message for the patient to return my call.  

## 2023-11-09 NOTE — Telephone Encounter (Signed)
Left message for the patient to return my call.

## 2023-11-12 NOTE — Telephone Encounter (Signed)
Patient reported he will contact his employer for them to send accomodation forms.

## 2023-11-12 NOTE — Telephone Encounter (Signed)
Copied from CRM [id number]. Topic: General - Call Back - No Documentation >> Nov 12, 2023  9:12 AM Isabell A wrote: Reason for CRM: Patient returning phone call to [employer], in regard to work accomodation forms. Patient is needing work accomodation forms.

## 2023-11-16 ENCOUNTER — Ambulatory Visit (INDEPENDENT_AMBULATORY_CARE_PROVIDER_SITE_OTHER): Payer: BC Managed Care – PPO | Admitting: Family Medicine

## 2023-11-16 VITALS — BP 138/80 | HR 82 | Temp 98.2°F | Wt 207.8 lb

## 2023-11-16 DIAGNOSIS — R7303 Prediabetes: Secondary | ICD-10-CM | POA: Diagnosis not present

## 2023-11-16 DIAGNOSIS — R739 Hyperglycemia, unspecified: Secondary | ICD-10-CM

## 2023-11-16 LAB — POCT GLYCOSYLATED HEMOGLOBIN (HGB A1C): Hemoglobin A1C: 6.4 % — AB (ref 4.0–5.6)

## 2023-11-16 NOTE — Progress Notes (Signed)
Established Patient Office Visit  Subjective   Patient ID: Paul [name], male    DOB: Feb 24, 1966  Age: 58 y.o. MRN: [id number]  No chief complaint on file.   HPI   Paul [name] is here to follow-up recent abnormal lab from physical.  He had fasting glucose 117.  He is not aware of any family history of type 2 diabetes.  No recent polyuria or polydipsia.  Appetite and weight stable.  Exercises currently some on weekends but essentially none on weekdays.  Other labs were stable.  Excellent lipids with LDL cholesterol 41.  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   Past Surgical History:  Procedure Laterality Date   COLONOSCOPY WITH PROPOFOL Left 07/05/2017   Procedure: COLONOSCOPY WITH PROPOFOL;  Surgeon: Willis Modena, MD;  Location: [facility] ENDOSCOPY;  Service: Endoscopy;  Laterality: Left;   PROCTOSCOPY N/A 10/10/2017   Procedure: RIGID PROCTOSCOPY;  Surgeon: Karie Soda, MD;  Location: [facility] ORS;  Service: General;  Laterality: N/A;   XI ROBOTIC ASSISTED LOWER ANTERIOR RESECTION N/A 10/10/2017   Procedure: XI ROBOTIC ASSISTED LOWER ANTERIOR RESECTION;  Surgeon: Karie Soda, MD;  Location: [facility] ORS;  Service: General;  Laterality: N/A;    reports that he has never smoked. He has never used smokeless tobacco. He reports that he does not drink alcohol and does not use drugs. family history includes Asthma in his daughter, father, son, and son; Cancer in his father; Dementia in his maternal grandmother; Hypertension in his mother. Allergies  Allergen Reactions   Chloroquine Itching    Review of Systems  Constitutional:  Negative for weight loss.  Genitourinary:  Negative for  frequency.      Objective:     BP 138/80 (BP Location: Left Arm, Cuff Size: Normal)   Pulse 82   Temp 98.2 F (36.8 C) (Oral)   Wt 207 lb 12.8 oz (94.3 kg)   SpO2 100%   BMI 28.15 kg/m  BP Readings from Last 3 Encounters:  11/16/23 138/80  10/15/23 (!) 150/88  09/28/23 (!) 140/84   Wt Readings from Last 3 Encounters:  11/16/23 207 lb 12.8 oz (94.3 kg)  10/15/23 203 lb 14.4 oz (92.5 kg)  09/28/23 209 lb (94.8 kg)      Physical Exam Vitals reviewed.  Constitutional:      General: He is not in acute distress. Cardiovascular:     Rate and Rhythm: Normal rate and regular rhythm.  Neurological:     Mental Status: He is alert.      Results for orders placed or performed in visit on 11/16/23  POC HgB A1c  Result Value Ref Range   Hemoglobin A1C 6.4 (A) 4.0 - 5.6 %   HbA1c POC (<> result, manual entry)     HbA1c, POC (prediabetic range)     HbA1c, POC (controlled diabetic range)      Last metabolic panel Lab Results  Component Value Date   GLUCOSE 117 (H) 10/15/2023   NA 141 10/15/2023   K 4.1 10/15/2023   CL 101 10/15/2023   CO2 33 (H) 10/15/2023   BUN 9 10/15/2023   CREATININE 0.83 10/15/2023   GFR 98.38 10/15/2023   CALCIUM  9.5 10/15/2023   PROT 7.8 10/15/2023   ALBUMIN 5.0 10/15/2023   BILITOT 0.8 10/15/2023   ALKPHOS 73 10/15/2023   AST 32 10/15/2023   ALT 36 10/15/2023   ANIONGAP 6 09/02/2021      The ASCVD Risk score (Arnett DK, et al., 2019) failed to calculate for the following reasons:   Unable to determine if patient is Non-Hispanic African American    Assessment & Plan:   Problem List Items Addressed This Visit   None Visit Diagnoses       Hyperglycemia    -  Primary   Relevant Orders   POC HgB A1c (Completed)     Prediabetes with A1c today 6.4%.  We strongly recommend lifestyle management at this point  -Try to establish more consistent exercise.  We reviewed American Heart Association recommendations for exercise -Try to  combine resistance and aerobic exercise -Also advised to consider tracking app to monitor carbohydrates and especially high glycemic foods. -Set up 32-month follow-up.  Repeat A1c at that time.  If climbing consider additional medication  Return in about 3 months (around 02/14/2024).    Evelena Peat, MD

## 2023-11-16 NOTE — Patient Instructions (Signed)
A1C was 6.4% which is prediabetes range  Set up 3 month follow up to reassess  Keep sugars and white starches down.

## 2023-12-03 ENCOUNTER — Other Ambulatory Visit: Payer: Self-pay | Admitting: Family Medicine

## 2023-12-21 ENCOUNTER — Telehealth: Payer: Self-pay | Admitting: Family Medicine

## 2023-12-21 NOTE — Telephone Encounter (Signed)
 Copied from CRM [id number]. Topic: General - Other >> Dec 21, 2023  3:49 PM Eunice Blase wrote: Reason for CRM: Pt stated accommodation from faxed last week and is due on 12/24/2023.Please call pt [phone].

## 2023-12-21 NOTE — Telephone Encounter (Signed)
 Forms have not been received at this time. I left detailed message on patient;s voicemail informing him to have his employer re-fax forms

## 2023-12-24 ENCOUNTER — Telehealth: Payer: Self-pay | Admitting: Family Medicine

## 2023-12-24 NOTE — Telephone Encounter (Signed)
 Patient dropped off document FMLA, to be filled out by provider. Patient requested to send it back via Fax within 5-days. Document is located in providers tray at front office.Please advise at Mobile [phone] (mobile)

## 2023-12-25 NOTE — Telephone Encounter (Signed)
 Noted.

## 2023-12-25 NOTE — Telephone Encounter (Signed)
 E2C2 will let pt know form can take 3-5 business days

## 2023-12-27 DIAGNOSIS — Z0279 Encounter for issue of other medical certificate: Secondary | ICD-10-CM

## 2024-01-21 ENCOUNTER — Telehealth (INDEPENDENT_AMBULATORY_CARE_PROVIDER_SITE_OTHER): Payer: BC Managed Care – PPO | Admitting: Primary Care

## 2024-01-21 ENCOUNTER — Telehealth: Payer: Self-pay

## 2024-01-21 ENCOUNTER — Encounter: Payer: Self-pay | Admitting: Primary Care

## 2024-01-21 VITALS — Ht 72.0 in | Wt 206.0 lb

## 2024-01-21 DIAGNOSIS — G4730 Sleep apnea, unspecified: Secondary | ICD-10-CM

## 2024-01-21 NOTE — Telephone Encounter (Signed)
 Please schedule pt to see Buelah Manis, NP in 6-8 weeks for CPAP compliance. Can be virtual.

## 2024-01-21 NOTE — Progress Notes (Signed)
 Virtual Visit via Video Note  I connected with Paul [name] on 01/21/24 at  8:30 AM EDT by a video enabled telemedicine application and verified that I am speaking with the correct person using two identifiers.  Location: Patient: Home Provider: Office    I discussed the limitations of evaluation and management by telemedicine and the availability of in person appointments. The patient expressed understanding and agreed to proceed.  History of Present Illness: 58 year old male, never smoked. PMH significant for HTN, GERD, iron deficiency anemia, rectal adenocarcinoma.    Previous [facility] pulmonary encounter:  09/28/2023 Discussed the use of AI scribe software for clinical note transcription with the patient, who gave verbal consent to proceed.   History of Present Illness   The patient, who has been experiencing symptoms of loud and disruptive snoring, comes in today for a sleep consult. The snoring has been so severe that his partner has resorted to using earplugs. The patient has not experienced gasping for air or choking during sleep, but has reported instances of what feels like acid reflux, causing him to wake up coughing. This occurs almost nightly, but is intermittent. The patient has previously taken medication for this issue, which seemed to alleviate the symptoms.   The patient's typical bedtime is from 12 AM to 1 AM, waking up once or twice during the night, and starting his day at 7 AM. Despite getting about six to seven hours of sleep, the patient reports feeling tired during the day. The patient occasionally takes naps during the day when possible.   The patient has no history of cardiac issues, seizures, narcolepsy, or sleepwalking. He has not reported any instances of thrashing, kicking, or hurting anyone during sleep. The patient's Epworth score is a six, indicating a lower level of daytime sleepiness.       Sleep questionnaire Symptoms- snoring    Prior sleep study-  none Bedtime-12am-1am Time to fall asleep- no long Nocturnal awakenings- 1-2 times Out of bed/start of day- 7am Weight changes- no Do you operate heavy machinery- no Do you currently wear CPAP- no Do you current wear oxygen- no Epworth- 6    01/21/2024 Discussed the use of AI scribe software for clinical note transcription with the patient, who gave verbal consent to proceed.  History of Present Illness   Paul [name] is a 58 year old male with severe sleep apnea who presents for a follow-up after a sleep consult.  He experiences severe snoring that disturbs his partner, necessitating the use of earplugs. His typical bedtime is between midnight and 1 AM, and he wakes up once or twice a night, starting his day at 6 AM. Despite getting 6 to 7 hours of sleep, he feels tired during the day. He reports non-restorative sleep, feeling tired despite sleeping for 7 hours.  A sleep study conducted at the end of December 20024 revealed severe sleep apnea, with an average of 41 apneic events per hour. He experienced 98 apneic events and 85 hypopneic events throughout the night. His oxygen levels dropped to a low of 77%, with 32 minutes spent below 88%. His baseline oxygen level was normal at 98%.  He inquired about the impact of sleep apnea on his blood pressure.      Observations/Objective:  Appears well without overt respiratory symptoms   Assessment and Plan:  1. Severe sleep apnea (Primary) - Ambulatory Referral for DME  Assessment and Plan    Severe Obstructive Sleep Apnea Severe obstructive sleep apnea confirmed by  December sleep study with an apnea-hypopnea index (AHI) of 41 events per hour and oxygen desaturation to 77%. Symptoms include severe snoring, non-restorative sleep, and daytime fatigue. Increased risk for cardiac arrhythmias, stroke, pulmonary hypertension, and diabetes. CPAP therapy recommended to alleviate symptoms and reduce health risks. Optimal results require nightly  use for at least 4-6 hours, ideally throughout the entire sleep period. - Initiate auto CPAP therapy, settings 5-20cm h20. Order CPAP machine from local medical supply store. - Instruct to use CPAP every night for at least 4-6 hours, ideally throughout the entire sleep period.  Follow Up Instructions:  - Schedule follow-up in 6-8 weeks to assess compliance and effectiveness of CPAP therapy.       I discussed the assessment and treatment plan with the patient. The patient was provided an opportunity to ask questions and all were answered. The patient agreed with the plan and demonstrated an understanding of the instructions.   The patient was advised to call back or seek an in-person evaluation if the symptoms worsen or if the condition fails to improve as anticipated.  I provided 22 minutes of non-face-to-face time during this encounter.   Glenford Bayley, NP

## 2024-02-15 DIAGNOSIS — G4733 Obstructive sleep apnea (adult) (pediatric): Secondary | ICD-10-CM | POA: Diagnosis not present

## 2024-03-16 DIAGNOSIS — G4733 Obstructive sleep apnea (adult) (pediatric): Secondary | ICD-10-CM | POA: Diagnosis not present

## 2024-04-16 DIAGNOSIS — G4733 Obstructive sleep apnea (adult) (pediatric): Secondary | ICD-10-CM | POA: Diagnosis not present

## 2024-05-16 DIAGNOSIS — G4733 Obstructive sleep apnea (adult) (pediatric): Secondary | ICD-10-CM | POA: Diagnosis not present

## 2024-06-16 DIAGNOSIS — G4733 Obstructive sleep apnea (adult) (pediatric): Secondary | ICD-10-CM | POA: Diagnosis not present

## 2024-07-17 DIAGNOSIS — G4733 Obstructive sleep apnea (adult) (pediatric): Secondary | ICD-10-CM | POA: Diagnosis not present

## 2024-09-16 DIAGNOSIS — G4733 Obstructive sleep apnea (adult) (pediatric): Secondary | ICD-10-CM | POA: Diagnosis not present

## 2024-10-21 ENCOUNTER — Ambulatory Visit (INDEPENDENT_AMBULATORY_CARE_PROVIDER_SITE_OTHER): Admitting: Family Medicine

## 2024-10-21 ENCOUNTER — Encounter: Payer: Self-pay | Admitting: Family Medicine

## 2024-10-21 VITALS — BP 132/86 | HR 82 | Temp 98.1°F | Ht 72.05 in | Wt 201.5 lb

## 2024-10-21 DIAGNOSIS — Z85038 Personal history of other malignant neoplasm of large intestine: Secondary | ICD-10-CM | POA: Diagnosis not present

## 2024-10-21 DIAGNOSIS — Z0000 Encounter for general adult medical examination without abnormal findings: Secondary | ICD-10-CM | POA: Diagnosis not present

## 2024-10-21 DIAGNOSIS — Z23 Encounter for immunization: Secondary | ICD-10-CM | POA: Diagnosis not present

## 2024-10-21 DIAGNOSIS — R7303 Prediabetes: Secondary | ICD-10-CM

## 2024-10-21 NOTE — Patient Instructions (Signed)
 Consider Prevnar 20 vaccine at some point in the next year.

## 2024-10-21 NOTE — Progress Notes (Signed)
 "  Established Patient Office Visit  Subjective   Patient ID: Paul [name], male    DOB: Dec 13, 1965  Age: 58 y.o. MRN: [id number]  Chief Complaint  Patient presents with   Annual Exam    HPI   Mr Paul [name] is seen today for physical exam.  His chronic problems include history of rectal adenocarcinoma 2018, hypertension, prediabetes.  A1c last year 6.4%.  No recent polyuria or polydipsia.  He is currently on amlodipine  5 mg daily for hypertension.  Not monitoring blood pressure regularly.  Generally feels well.  Has not been exercising much recently and knows he needs to step this up some.  He does exercise some on weekends but does not have good continuity with exercise during the week.  Health maintenance reviewed:  Health Maintenance  Topic Date Due   HIV Screening  Never done   Hepatitis B Vaccines 19-59 Average Risk (1 of 3 - 19+ 3-dose series) Never done   Pneumococcal Vaccine: 50+ Years (1 of 1 - PCV) Never done   COVID-19 Vaccine (5 - 2025-26 season) 06/23/2024   DTaP/Tdap/Td (3 - Td or Tdap) 09/30/2025   Colonoscopy  10/08/2031   Influenza Vaccine  Completed   Hepatitis C Screening  Completed   Zoster Vaccines- Shingrix   Completed   HPV VACCINES  Aged Out   Meningococcal B Vaccine  Aged Out   - No flu vaccine yet and this was offered and patient consents - Discussed Prevnar 20 and he declines at this time but will consider at some point this year - Shingrix  vaccine already completed - Colonoscopy due 12/27  Social history-married.  Works for [employer].  Oldest child attends [employer].  Patient has no history of smoking.  No alcohol.  He is from [place]  Family history-father died age 67 of prostate cancer.  His mother is alive at age 30 and still lives in [place].  She has history of hypertension.  He states he had 18 siblings. 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   Past Surgical History:  Procedure Laterality Date   COLONOSCOPY WITH PROPOFOL  Left 07/05/2017   Procedure: COLONOSCOPY WITH PROPOFOL ;  Surgeon: Burnette Fallow, MD;  Location: [facility] ENDOSCOPY;  Service: Endoscopy;  Laterality: Left;   PROCTOSCOPY N/A 10/10/2017   Procedure: RIGID PROCTOSCOPY;  Surgeon: Sheldon Standing, MD;  Location: [facility] OR[facility];  Service: General;  Laterality: N/A;   XI ROBOTIC ASSISTED LOWER ANTERIOR RESECTION N/A 10/10/2017   Procedure: XI ROBOTIC ASSISTED LOWER ANTERIOR RESECTION;  Surgeon: Sheldon Standing, MD;  Location: [facility] ORS;  Service: General;  Laterality: N/A;    reports that he has never smoked. He has never used smokeless tobacco. He reports that he does not drink alcohol and does not use drugs. family history includes Asthma in his daughter, father, son, and son; Cancer in his father; Dementia in his maternal grandmother; Hypertension in his mother. Allergies[1]    Review of Systems  Constitutional:  Negative for chills, fever, malaise/fatigue and weight loss.  HENT:  Negative for hearing loss.   Eyes:  Negative for blurred vision and double vision.  Respiratory:  Negative for  cough and shortness of breath.   Cardiovascular:  Negative for chest pain, palpitations and leg swelling.  Gastrointestinal:  Negative for abdominal pain, blood in stool, constipation and diarrhea.  Genitourinary:  Negative for dysuria.  Skin:  Negative for rash.  Neurological:  Negative for dizziness, speech change, seizures, loss of consciousness and headaches.  Psychiatric/Behavioral:  Negative for depression.       Objective:     BP 132/86 (BP Location: Left Arm, Cuff Size: Normal)   Pulse 82   Temp 98.1 F (36.7 C) (Oral)   Ht 6'  0.05 (1.83 m)   Wt 201 lb 8 oz (91.4 kg)   SpO2 98%   BMI 27.29 kg/m  BP Readings from Last 3 Encounters:  10/21/24 132/86  11/16/23 138/80  10/15/23 (!) 150/88   Wt Readings from Last 3 Encounters:  10/21/24 201 lb 8 oz (91.4 kg)  01/21/24 206 lb (93.4 kg)  11/16/23 207 lb 12.8 oz (94.3 kg)      Physical Exam Vitals reviewed.  Constitutional:      General: He is not in acute distress.    Appearance: He is well-developed. He is not ill-appearing.  HENT:     Head: Normocephalic and atraumatic.     Right Ear: External ear normal.     Left Ear: External ear normal.  Eyes:     Conjunctiva/sclera: Conjunctivae normal.     Pupils: Pupils are equal, round, and reactive to light.  Neck:     Thyroid : No thyromegaly.  Cardiovascular:     Rate and Rhythm: Normal rate and regular rhythm.     Heart sounds: Normal heart sounds. No murmur heard. Pulmonary:     Effort: No respiratory distress.     Breath sounds: No wheezing or rales.  Abdominal:     General: Bowel sounds are normal. There is no distension.     Palpations: Abdomen is soft. There is no mass.     Tenderness: There is no abdominal tenderness. There is no guarding or rebound.  Musculoskeletal:     Cervical back: Normal range of motion and neck supple.  Lymphadenopathy:     Cervical: No cervical adenopathy.  Skin:    Findings: No rash.  Neurological:     Mental Status: He is alert and oriented to person, place, and time.     Cranial Nerves: No cranial nerve deficit.      No results found for any visits on 10/21/24.    The ASCVD Risk score (Arnett DK, et al., 2019) failed to calculate for the following reasons:   Unable to determine if patient is Non-Hispanic African American    Assessment & Plan:   Problem List Items Addressed This Visit       Unprioritized   Prediabetes   Relevant Orders   Hemoglobin A1c   Other Visit Diagnoses       Physical exam    -  Primary   Relevant Orders   Basic  metabolic panel with GFR   Lipid panel   CBC with Differential/Platelet   Hepatic function panel   PSA     History of colon cancer       Relevant Orders   CEA     Need for influenza vaccination       Relevant Orders   Flu vaccine trivalent PF, 6mos and older(Flulaval,Afluria,Fluarix,Fluzone) (Completed)     58 year old male here for complete physical.  Chronic problems as above.  Recommend multiple labs today and follow-up as  above.  We discussed the following other health maintenance items  -Try to establish more consistent exercise with minimum 150 minutes/week of moderate intensity exercise as per American Heart Association guidelines - Recommend flu vaccine and patient consents - Recommend he consider Prevnar 20.  He declines today but will consider at some point this year - Continue colonoscopies as per GI recommendations - Blood pressure was up a bit today.  We recommend close monitoring and be in touch if systolic greater than 140 or diastolics over 90.  No follow-ups on file.    Wolm Scarlet, MD     [1]  Allergies Allergen Reactions   Chloroquine Itching   "

## 2024-10-22 LAB — CBC WITH DIFFERENTIAL/PLATELET
Basophils Absolute: 0 K/uL (ref 0.0–0.1)
Basophils Relative: 1.1 % (ref 0.0–3.0)
Eosinophils Absolute: 0.2 K/uL (ref 0.0–0.7)
Eosinophils Relative: 5.8 % — ABNORMAL HIGH (ref 0.0–5.0)
HCT: 44 % (ref 39.0–52.0)
Hemoglobin: 14.9 g/dL (ref 13.0–17.0)
Lymphocytes Relative: 22.9 % (ref 12.0–46.0)
Lymphs Abs: 0.9 K/uL (ref 0.7–4.0)
MCHC: 33.8 g/dL (ref 30.0–36.0)
MCV: 89.3 fl (ref 78.0–100.0)
Monocytes Absolute: 0.3 K/uL (ref 0.1–1.0)
Monocytes Relative: 7.6 % (ref 3.0–12.0)
Neutro Abs: 2.5 K/uL (ref 1.4–7.7)
Neutrophils Relative %: 62.6 % (ref 43.0–77.0)
Platelets: 202 K/uL (ref 150.0–400.0)
RBC: 4.92 Mil/uL (ref 4.22–5.81)
RDW: 13.4 % (ref 11.5–15.5)
WBC: 4 K/uL (ref 4.0–10.5)

## 2024-10-22 LAB — BASIC METABOLIC PANEL WITH GFR
BUN: 8 mg/dL (ref 6–23)
CO2: 30 meq/L (ref 19–32)
Calcium: 9.4 mg/dL (ref 8.4–10.5)
Chloride: 102 meq/L (ref 96–112)
Creatinine, Ser: 0.82 mg/dL (ref 0.40–1.50)
GFR: 98.04 mL/min
Glucose, Bld: 100 mg/dL — ABNORMAL HIGH (ref 70–99)
Potassium: 3.9 meq/L (ref 3.5–5.1)
Sodium: 141 meq/L (ref 135–145)

## 2024-10-22 LAB — PSA: PSA: 0.85 ng/mL (ref 0.10–4.00)

## 2024-10-22 LAB — LIPID PANEL
Cholesterol: 140 mg/dL (ref 28–200)
HDL: 69.8 mg/dL
LDL Cholesterol: 52 mg/dL (ref 10–99)
NonHDL: 70.49
Total CHOL/HDL Ratio: 2
Triglycerides: 91 mg/dL (ref 10.0–149.0)
VLDL: 18.2 mg/dL (ref 0.0–40.0)

## 2024-10-22 LAB — HEMOGLOBIN A1C: Hgb A1c MFr Bld: 6.3 % (ref 4.6–6.5)

## 2024-10-22 LAB — HEPATIC FUNCTION PANEL
ALT: 21 U/L (ref 3–53)
AST: 21 U/L (ref 5–37)
Albumin: 5 g/dL (ref 3.5–5.2)
Alkaline Phosphatase: 62 U/L (ref 39–117)
Bilirubin, Direct: 0.1 mg/dL (ref 0.1–0.3)
Total Bilirubin: 0.7 mg/dL (ref 0.2–1.2)
Total Protein: 7.8 g/dL (ref 6.0–8.3)

## 2024-10-22 LAB — CEA: CEA: <2 ng/mL

## 2024-10-24 ENCOUNTER — Ambulatory Visit: Payer: Self-pay | Admitting: Family Medicine

## 2024-11-27 ENCOUNTER — Other Ambulatory Visit: Payer: Self-pay | Admitting: Family Medicine
